# Patient Record
Sex: Female | Born: 1945 | Race: Black or African American | Hispanic: No | Marital: Married | Smoking: Never smoker
Health system: Southern US, Community
[De-identification: ages and names within clinical notes are randomized; demographics above are authoritative.]

## PROBLEM LIST (undated history)

## (undated) DIAGNOSIS — T7840XA Allergy, unspecified, initial encounter: Secondary | ICD-10-CM

## (undated) DIAGNOSIS — Z9889 Other specified postprocedural states: Secondary | ICD-10-CM

## (undated) DIAGNOSIS — I10 Essential (primary) hypertension: Secondary | ICD-10-CM

## (undated) DIAGNOSIS — I2510 Atherosclerotic heart disease of native coronary artery without angina pectoris: Secondary | ICD-10-CM

## (undated) DIAGNOSIS — R112 Nausea with vomiting, unspecified: Secondary | ICD-10-CM

## (undated) DIAGNOSIS — N289 Disorder of kidney and ureter, unspecified: Secondary | ICD-10-CM

## (undated) HISTORY — DX: Allergy, unspecified, initial encounter: T78.40XA

## (undated) HISTORY — PX: ABDOMINAL HYSTERECTOMY: SHX81

## (undated) HISTORY — PX: CORONARY ANGIOPLASTY WITH STENT PLACEMENT: SHX49

---

## 2012-03-26 DIAGNOSIS — D179 Benign lipomatous neoplasm, unspecified: Secondary | ICD-10-CM

## 2012-03-26 HISTORY — DX: Benign lipomatous neoplasm, unspecified: D17.9

## 2012-12-09 HISTORY — PX: MITRAL VALVE REPAIR: SHX2039

## 2018-04-24 HISTORY — PX: TRANSCATHETER AORTIC VALVE REPLACEMENT, TRANSFEMORAL: SHX6400

## 2019-03-27 HISTORY — PX: CAROTID ENDARTERECTOMY: SUR193

## 2020-03-09 ENCOUNTER — Other Ambulatory Visit: Payer: Self-pay

## 2020-03-09 ENCOUNTER — Encounter (HOSPITAL_COMMUNITY): Payer: Self-pay

## 2020-03-09 ENCOUNTER — Observation Stay (HOSPITAL_COMMUNITY)
Admission: EM | Admit: 2020-03-09 | Discharge: 2020-03-11 | Disposition: A | Discharge status: Home / Self Care | Payer: Medicare HMO | Attending: Emergency Medicine | Admitting: Emergency Medicine

## 2020-03-09 DIAGNOSIS — N1832 Chronic kidney disease, stage 3b: Secondary | ICD-10-CM | POA: Diagnosis not present

## 2020-03-09 DIAGNOSIS — K5730 Diverticulosis of large intestine without perforation or abscess without bleeding: Secondary | ICD-10-CM | POA: Insufficient documentation

## 2020-03-09 DIAGNOSIS — E039 Hypothyroidism, unspecified: Secondary | ICD-10-CM | POA: Diagnosis present

## 2020-03-09 DIAGNOSIS — I2510 Atherosclerotic heart disease of native coronary artery without angina pectoris: Secondary | ICD-10-CM | POA: Diagnosis not present

## 2020-03-09 DIAGNOSIS — Z7901 Long term (current) use of anticoagulants: Secondary | ICD-10-CM | POA: Insufficient documentation

## 2020-03-09 DIAGNOSIS — R531 Weakness: Secondary | ICD-10-CM | POA: Diagnosis not present

## 2020-03-09 DIAGNOSIS — I480 Paroxysmal atrial fibrillation: Secondary | ICD-10-CM | POA: Diagnosis present

## 2020-03-09 DIAGNOSIS — I129 Hypertensive chronic kidney disease with stage 1 through stage 4 chronic kidney disease, or unspecified chronic kidney disease: Secondary | ICD-10-CM | POA: Diagnosis not present

## 2020-03-09 DIAGNOSIS — E876 Hypokalemia: Secondary | ICD-10-CM | POA: Diagnosis present

## 2020-03-09 DIAGNOSIS — I700 Atherosclerosis of aorta: Secondary | ICD-10-CM | POA: Insufficient documentation

## 2020-03-09 DIAGNOSIS — K8020 Calculus of gallbladder without cholecystitis without obstruction: Secondary | ICD-10-CM | POA: Diagnosis not present

## 2020-03-09 DIAGNOSIS — K449 Diaphragmatic hernia without obstruction or gangrene: Secondary | ICD-10-CM | POA: Diagnosis not present

## 2020-03-09 DIAGNOSIS — R112 Nausea with vomiting, unspecified: Principal | ICD-10-CM | POA: Diagnosis present

## 2020-03-09 DIAGNOSIS — Z20822 Contact with and (suspected) exposure to covid-19: Secondary | ICD-10-CM | POA: Insufficient documentation

## 2020-03-09 DIAGNOSIS — E782 Mixed hyperlipidemia: Secondary | ICD-10-CM | POA: Diagnosis not present

## 2020-03-09 DIAGNOSIS — E119 Type 2 diabetes mellitus without complications: Secondary | ICD-10-CM | POA: Diagnosis present

## 2020-03-09 DIAGNOSIS — Z79899 Other long term (current) drug therapy: Secondary | ICD-10-CM | POA: Insufficient documentation

## 2020-03-09 DIAGNOSIS — E1169 Type 2 diabetes mellitus with other specified complication: Secondary | ICD-10-CM | POA: Diagnosis not present

## 2020-03-09 DIAGNOSIS — I160 Hypertensive urgency: Secondary | ICD-10-CM | POA: Diagnosis present

## 2020-03-09 DIAGNOSIS — R9431 Abnormal electrocardiogram [ECG] [EKG]: Secondary | ICD-10-CM | POA: Diagnosis present

## 2020-03-09 DIAGNOSIS — E1122 Type 2 diabetes mellitus with diabetic chronic kidney disease: Secondary | ICD-10-CM | POA: Diagnosis present

## 2020-03-09 HISTORY — DX: Essential (primary) hypertension: I10

## 2020-03-09 HISTORY — DX: Atherosclerotic heart disease of native coronary artery without angina pectoris: I25.10

## 2020-03-09 HISTORY — DX: Disorder of kidney and ureter, unspecified: N28.9

## 2020-03-09 LAB — CBC WITH DIFFERENTIAL/PLATELET
Abs Immature Granulocytes: 0.02 10*3/uL (ref 0.00–0.07)
Basophils Absolute: 0.1 10*3/uL (ref 0.0–0.1)
Basophils Relative: 1 %
Eosinophils Absolute: 0.1 10*3/uL (ref 0.0–0.5)
Eosinophils Relative: 1 %
HCT: 31.3 % — ABNORMAL LOW (ref 36.0–46.0)
Hemoglobin: 10 g/dL — ABNORMAL LOW (ref 12.0–15.0)
Immature Granulocytes: 0 %
Lymphocytes Relative: 13 %
Lymphs Abs: 1.1 10*3/uL (ref 0.7–4.0)
MCH: 29.7 pg (ref 26.0–34.0)
MCHC: 31.9 g/dL (ref 30.0–36.0)
MCV: 92.9 fL (ref 80.0–100.0)
Monocytes Absolute: 0.6 10*3/uL (ref 0.1–1.0)
Monocytes Relative: 7 %
Neutro Abs: 6.3 10*3/uL (ref 1.7–7.7)
Neutrophils Relative %: 78 %
Platelets: 339 10*3/uL (ref 150–400)
RBC: 3.37 MIL/uL — ABNORMAL LOW (ref 3.87–5.11)
RDW: 13.7 % (ref 11.5–15.5)
WBC: 8.1 10*3/uL (ref 4.0–10.5)
nRBC: 0 % (ref 0.0–0.2)

## 2020-03-09 LAB — COMPREHENSIVE METABOLIC PANEL
ALT: 16 U/L (ref 0–44)
AST: 21 U/L (ref 15–41)
Albumin: 3.5 g/dL (ref 3.5–5.0)
Alkaline Phosphatase: 57 U/L (ref 38–126)
Anion gap: 10 (ref 5–15)
BUN: 24 mg/dL — ABNORMAL HIGH (ref 8–23)
CO2: 25 mmol/L (ref 22–32)
Calcium: 8.5 mg/dL — ABNORMAL LOW (ref 8.9–10.3)
Chloride: 103 mmol/L (ref 98–111)
Creatinine, Ser: 1.78 mg/dL — ABNORMAL HIGH (ref 0.44–1.00)
GFR, Estimated: 29 mL/min — ABNORMAL LOW (ref >60)
Glucose, Bld: 133 mg/dL — ABNORMAL HIGH (ref 70–99)
Potassium: 2.8 mmol/L — ABNORMAL LOW (ref 3.5–5.1)
Sodium: 138 mmol/L (ref 135–145)
Total Bilirubin: 0.5 mg/dL (ref 0.3–1.2)
Total Protein: 6.8 g/dL (ref 6.5–8.1)

## 2020-03-09 LAB — PROTIME-INR
INR: 1.6 — ABNORMAL HIGH (ref 0.8–1.2)
Prothrombin Time: 18.5 seconds — ABNORMAL HIGH (ref 11.4–15.2)

## 2020-03-09 LAB — MAGNESIUM: Magnesium: 2.1 mg/dL (ref 1.7–2.4)

## 2020-03-09 MED ORDER — SODIUM CHLORIDE 0.9 % IV BOLUS
500.0000 mL | Freq: Once | INTRAVENOUS | Status: AC
  Administered 2020-03-09: 500 mL via INTRAVENOUS

## 2020-03-09 MED ORDER — LORAZEPAM 2 MG/ML IJ SOLN
0.5000 mg | Freq: Once | INTRAMUSCULAR | Status: AC
  Administered 2020-03-09: 0.5 mg via INTRAVENOUS
  Filled 2020-03-09: qty 1

## 2020-03-09 MED ORDER — POTASSIUM CHLORIDE 10 MEQ/100ML IV SOLN
10.0000 meq | INTRAVENOUS | Status: AC
  Administered 2020-03-10 (×2): 10 meq via INTRAVENOUS
  Filled 2020-03-09 (×2): qty 100

## 2020-03-09 NOTE — ED Notes (Signed)
Obtaining vitals delayed due to pt being cleaned.  Pt was covered in feces upon arrival.  Pt states she had a bowel movement while vomiting.  NADN.  Will continue to monitor.

## 2020-03-09 NOTE — ED Provider Notes (Addendum)
[facility] DEPT Provider Note   CSN: [id number] Arrival date & time: 03/09/20  2139     History Chief Complaint  Patient presents with  . Nausea  . Weakness    Destiny Rice is a 74 y.o. female with a history of CAD x 10 stents on coumadin, diabetes mellitus type 2, HTN, and CKD who presents to the emergency department by EMS from home with a chief complaint of nausea, vomiting, and diarrhea.  The patient has had countless episodes of nonbloody, nonbilious vomiting, nonbloody diarrhea, nausea that began approximately 1 to 2 hours prior to arrival.  She reports that after onset of symptoms, she also began to feel very lightheaded as if she might pass out.  Prior to onset of symptoms, she was feeling at her baseline.  She denies fever, chills, abdominal pain, dysuria, hematuria, flank pain, vaginal bleeding or discharge, chest pain, shortness of breath, headache, visual changes, dizziness, numbness, or syncope.  She ate clam chowder and a tuna sandwich for dinner.  Her husband also ate the same, but has been asymptomatic.  No other concerns for suspicious food intake.  No recent travel.  No known sick contacts.  The patient recently moved to the area from [place] was recently established with cardiology and at the Coumadin clinic.  She reports that she has had multiple medication changes in the last week.  She denies a history of congestive heart failure.  The history is provided by the patient and medical records. No language interpreter was used.       Past Medical History:  Diagnosis Date  . Coronary artery disease   . Hypertension   . Renal disorder     Patient Active Problem List   Diagnosis Date Noted  . Intractable nausea and vomiting 03/10/2020    Past Surgical History:  Procedure Laterality Date  . CORONARY ANGIOPLASTY WITH STENT PLACEMENT     x10     OB History   No obstetric history on file.     History reviewed. No pertinent  family history.     Home Medications Prior to Admission medications   Medication Sig Start Date End Date Taking? Authorizing Provider  amiodarone (PACERONE) 200 MG tablet Take 200 mg by mouth daily.   Yes [provider]  folic acid (FOLVITE) 1 MG tablet Take 1 mg by mouth daily.   Yes [provider]  hydrochlorothiazide (HYDRODIURIL) 25 MG tablet Take 25 mg by mouth daily.   Yes [provider]  losartan (COZAAR) 100 MG tablet Take 100 mg by mouth daily.   Yes [provider]  vitamin B-12 (CYANOCOBALAMIN) 1000 MCG tablet Take 1,000 mcg by mouth daily.   Yes [provider]    Allergies    Sulfa antibiotics  Review of Systems   Review of Systems  Constitutional: Negative for activity change, chills, diaphoresis and fever.  HENT: Negative for congestion and sore throat.   Respiratory: Negative for cough, shortness of breath and wheezing.   Cardiovascular: Negative for chest pain and palpitations.  Gastrointestinal: Positive for diarrhea, nausea and vomiting. Negative for abdominal pain, blood in stool and constipation.  Genitourinary: Negative for dysuria, flank pain, frequency, hematuria, urgency, vaginal bleeding, vaginal discharge and vaginal pain.  Musculoskeletal: Negative for back pain, myalgias, neck pain and neck stiffness.  Skin: Negative for color change, rash and wound.  Allergic/Immunologic: Negative for immunocompromised state.  Neurological: Positive for weakness. Negative for seizures, syncope, numbness and headaches.  Psychiatric/Behavioral: Negative  for confusion.    Physical Exam Updated Vital Signs BP (!) 149/123   Pulse 65   Temp 97.7 F (36.5 C)   Resp 18   Ht 5\' 5"  (1.651 m)   Wt 94.8 kg   SpO2 97%   BMI 34.78 kg/m   Physical Exam Vitals and nursing note reviewed.  Constitutional:      General: She is not in acute distress.    Appearance: She is obese. She is not ill-appearing, toxic-appearing or  diaphoretic.  HENT:     Head: Normocephalic.     Mouth/Throat:     Mouth: Mucous membranes are moist.  Eyes:     Conjunctiva/sclera: Conjunctivae normal.  Cardiovascular:     Rate and Rhythm: Normal rate and regular rhythm.     Heart sounds: No murmur heard. No friction rub. No gallop.   Pulmonary:     Effort: Pulmonary effort is normal. No respiratory distress.     Breath sounds: No stridor. No wheezing, rhonchi or rales.  Chest:     Chest wall: No tenderness.  Abdominal:     General: There is no distension.     Palpations: Abdomen is soft. There is no mass.     Tenderness: There is no abdominal tenderness. There is no right CVA tenderness, left CVA tenderness, guarding or rebound.     Hernia: No hernia is present.     Comments: Abdomen is soft, nontender, nondistended.  Musculoskeletal:     Cervical back: Neck supple.  Skin:    General: Skin is warm.     Capillary Refill: Capillary refill takes less than 2 seconds.     Findings: No rash.  Neurological:     Mental Status: She is alert.  Psychiatric:        Behavior: Behavior normal.     ED Results / Procedures / Treatments   Labs (all labs ordered are listed, but only abnormal results are displayed) Labs Reviewed  CBC WITH DIFFERENTIAL/PLATELET - Abnormal; Notable for the following components:      Result Value   RBC 3.37 (*)    Hemoglobin 10.0 (*)    HCT 31.3 (*)    All other components within normal limits  COMPREHENSIVE METABOLIC PANEL - Abnormal; Notable for the following components:   Potassium 2.8 (*)    Glucose, Bld 133 (*)    BUN 24 (*)    Creatinine, Ser 1.78 (*)    Calcium 8.5 (*)    GFR, Estimated 29 (*)    All other components within normal limits  PROTIME-INR - Abnormal; Notable for the following components:   Prothrombin Time 18.5 (*)    INR 1.6 (*)    All other components within normal limits  RESP PANEL BY RT-PCR (FLU A&B, COVID) ARPGX2  URINALYSIS, ROUTINE W REFLEX MICROSCOPIC  MAGNESIUM     EKG EKG Interpretation  Date/Time:  Wednesday March 09 2020 22:21:15 EST Ventricular Rate:  61 PR Interval:    QRS Duration: 108 QT Interval:  504 QTC Calculation: 508 R Axis:   73 Text Interpretation: Sinus rhythm Prolonged PR interval Prolonged QT interval No old tracing to compare Confirmed by Ward, Cyril Mourning [phone]) on 03/09/2020 11:32:39 PM   Radiology CT ABDOMEN PELVIS WO CONTRAST  Result Date: 03/10/2020 CLINICAL DATA:  Nausea, vomiting EXAM: CT ABDOMEN AND PELVIS WITHOUT CONTRAST TECHNIQUE: Multidetector CT imaging of the abdomen and pelvis was performed following the standard protocol without IV contrast. COMPARISON:  None. FINDINGS: Lower chest: Moderate-sized hiatal hernia.  Peribronchial thickening in the lower lobes. Bibasilar opacities, likely atelectasis. No effusions. Hepatobiliary: Small layering gallstones within the gallbladder. No focal hepatic abnormality. Pancreas: No focal abnormality or ductal dilatation. Spleen: No focal abnormality.  Normal size. Adrenals/Urinary Tract: Small bilateral renal cysts. Renovascular calcifications. No ureteral stones or hydronephrosis. Adrenal glands and urinary bladder unremarkable. Stomach/Bowel: Colonic diverticulosis. No active diverticulitis. Normal appendix. Stomach and small bowel decompressed, unremarkable. Vascular/Lymphatic: Aortic atherosclerosis. No evidence of aneurysm or adenopathy. Reproductive: Prior hysterectomy.  No adnexal masses. Other: No free fluid or free air. Musculoskeletal: No acute bony abnormality. IMPRESSION: Peribronchial thickening in the lower lobes.  Bibasilar atelectasis. Moderate-sized hiatal hernia. Cholelithiasis. Colonic diverticulosis.  No active diverticulitis. Aortic atherosclerosis. Electronically Signed   By: Rolm Baptise M.D.   On: 03/10/2020 01:58    Procedures Procedures (including critical care time)  Medications Ordered in ED Medications  LORazepam (ATIVAN) injection 0.5 mg (0.5 mg  Intravenous Given 03/09/20 2250)  sodium chloride 0.9 % bolus 500 mL (0 mLs Intravenous Stopped 03/10/20 0031)  potassium chloride 10 mEq in 100 mL IVPB (0 mEq Intravenous Stopped 03/10/20 0328)  hydrALAZINE (APRESOLINE) injection 10 mg (10 mg Intravenous Given 03/10/20 0045)  potassium chloride SA (KLOR-CON) CR tablet 40 mEq (40 mEq Oral Given 03/10/20 0045)  meclizine (ANTIVERT) tablet 12.5 mg (12.5 mg Oral Given 03/10/20 0153)  LORazepam (ATIVAN) injection 0.5 mg (0.5 mg Intravenous Given 03/10/20 0211)  sodium chloride 0.9 % bolus 1,000 mL (1,000 mLs Intravenous New Bag/Given 03/10/20 0328)    ED Course  I have reviewed the triage vital signs and the nursing notes.  Pertinent labs & imaging results that were available during my care of the patient were reviewed by me and considered in my medical decision making (see chart for details).  Clinical Course as of 03/10/20 0410  Thu Mar 10, 2020  0314 Called back to room.  Patient is actively vomiting at bedside.  We will plan for admission for intractable vomiting. [MM]    Clinical Course User Index [MM] Amada Hallisey, Laymond Purser, PA-C   MDM Rules/Calculators/A&P                          74 year old female with a history of CAD x 10 stents on coumadin, diabetes mellitus type 2, HTN, and CKD who presents emergency department by EMS with nausea, vomiting, diarrhea, onset 1 to 2 hours prior to arrival.  Markedly hypertensive at 220/60 on arrival.  She is afebrile.  No tachycardia, tachypnea, or hypoxia.  The patient was seen and independently evaluated by Dr. Leonides Schanz, attending physician.  Differential diagnosis includes cholecystitis, pancreatitis, viral gastroenteritis, gastroenteritis secondary to food poisoning, appendicitis, bowel obstruction, pyelonephritis.  Labs and imaging independently reviewed and interpreted by me.  She is hypokalemic to 2.8.  Magnesium level is normal.  She does have a prolonged QTC on EKG.  Creatinine is 1.78.  No  previous labs available for comparison.  Hemoglobin is 10, but she denies any recent bleeding.  No previous available for comparison.  She is on Coumadin, but denies any active bleeding.  INR is 1.6.  UA is pending.  CT with cholelithiasis, but no evidence of cholecystitis.  She has colonic diverticulosis.  There is peribronchial thickening in the lower lobes and bibasilar atelectasis.  No acute intra-abdominal findings.  She was treated with 1 L of IV fluids in the ER.  She was given Ativan x2 for vomiting given prolonged QTC, but continued to have intractable  vomiting.  Given that she was already hypokalemic, she will require admission for intractable vomiting.  I am suspicious for contaminated food, but she does note that her husband ate the same meal and has not been asymptomatic.  She adamantly denies chest pain.  In regards to her blood pressure, she has been compliant with all of her home medications, but does note that she recently had her medications changed over the last week.  She does have some slight confusion about her current home regimen.  She was given IV hydralazine with initial improvement, but blood pressure has significantly waxed and waned since she arrived in the emergency department. I have a low suspicion for hypertensive urgency or emergency at this time.  Spoke with Dr. Cyd Silence, hospitalist, who will accept the patient for admission.  The patient appears reasonably stabilized for admission considering the current resources, flow, and capabilities available in the ED at this time, and I doubt any other [facility] requiring further screening and/or treatment in the ED prior to admission.  ADDENDUM: Care everywhere records were not available at the time that this patient was seen and evaluated.  They were available after the patient was admitted.   Final Clinical Impression(s) / ED Diagnoses Final diagnoses:  Intractable vomiting with nausea, unspecified vomiting type  Hypokalemia     Rx / DC Orders ED Discharge Orders    None       Lekia Nier A, PA-C 03/10/20 0405    Abeera Flannery A, PA-C 03/10/20 0410    Ward, Delice Bison, DO 03/10/20 [phone]

## 2020-03-09 NOTE — ED Triage Notes (Signed)
Pt to ER via EMS complaining of N/V/weakness.  Pt also defecated on herself while vomiting.  Pt reports a history of vertigo, but states that this feels different.  Pt actively vomiting upon arrival.

## 2020-03-10 ENCOUNTER — Encounter (HOSPITAL_COMMUNITY): Payer: Self-pay | Admitting: Internal Medicine

## 2020-03-10 ENCOUNTER — Emergency Department (HOSPITAL_COMMUNITY): Payer: Medicare HMO

## 2020-03-10 DIAGNOSIS — E1122 Type 2 diabetes mellitus with diabetic chronic kidney disease: Secondary | ICD-10-CM

## 2020-03-10 DIAGNOSIS — N1832 Chronic kidney disease, stage 3b: Secondary | ICD-10-CM | POA: Diagnosis present

## 2020-03-10 DIAGNOSIS — E119 Type 2 diabetes mellitus without complications: Secondary | ICD-10-CM | POA: Diagnosis present

## 2020-03-10 DIAGNOSIS — I160 Hypertensive urgency: Secondary | ICD-10-CM | POA: Diagnosis not present

## 2020-03-10 DIAGNOSIS — R112 Nausea with vomiting, unspecified: Secondary | ICD-10-CM

## 2020-03-10 DIAGNOSIS — I480 Paroxysmal atrial fibrillation: Secondary | ICD-10-CM | POA: Diagnosis not present

## 2020-03-10 DIAGNOSIS — E039 Hypothyroidism, unspecified: Secondary | ICD-10-CM | POA: Diagnosis present

## 2020-03-10 DIAGNOSIS — R9431 Abnormal electrocardiogram [ECG] [EKG]: Secondary | ICD-10-CM

## 2020-03-10 DIAGNOSIS — E782 Mixed hyperlipidemia: Secondary | ICD-10-CM

## 2020-03-10 DIAGNOSIS — E876 Hypokalemia: Secondary | ICD-10-CM

## 2020-03-10 DIAGNOSIS — E1169 Type 2 diabetes mellitus with other specified complication: Secondary | ICD-10-CM

## 2020-03-10 DIAGNOSIS — I2510 Atherosclerotic heart disease of native coronary artery without angina pectoris: Secondary | ICD-10-CM | POA: Diagnosis present

## 2020-03-10 HISTORY — DX: Hypothyroidism, unspecified: E03.9

## 2020-03-10 HISTORY — DX: Type 2 diabetes mellitus with diabetic chronic kidney disease: E11.22

## 2020-03-10 HISTORY — DX: Type 2 diabetes mellitus with other specified complication: E11.69

## 2020-03-10 HISTORY — DX: Paroxysmal atrial fibrillation: I48.0

## 2020-03-10 HISTORY — DX: Chronic kidney disease, stage 3b: N18.32

## 2020-03-10 LAB — URINALYSIS, ROUTINE W REFLEX MICROSCOPIC
Bilirubin Urine: NEGATIVE
Glucose, UA: NEGATIVE mg/dL
Hgb urine dipstick: NEGATIVE
Ketones, ur: NEGATIVE mg/dL
Leukocytes,Ua: NEGATIVE
Nitrite: NEGATIVE
Protein, ur: NEGATIVE mg/dL
Specific Gravity, Urine: 1.012 (ref 1.005–1.030)
pH: 6 (ref 5.0–8.0)

## 2020-03-10 LAB — GLUCOSE, CAPILLARY
Glucose-Capillary: 201 mg/dL — ABNORMAL HIGH (ref 70–99)
Glucose-Capillary: 114 mg/dL — ABNORMAL HIGH (ref 70–99)

## 2020-03-10 LAB — APTT: aPTT: 34 seconds (ref 24–36)

## 2020-03-10 LAB — RESP PANEL BY RT-PCR (FLU A&B, COVID) ARPGX2
Influenza A by PCR: NEGATIVE
Influenza B by PCR: NEGATIVE
SARS Coronavirus 2 by RT PCR: NEGATIVE

## 2020-03-10 LAB — TROPONIN I (HIGH SENSITIVITY): Troponin I (High Sensitivity): 12 ng/L (ref <18)

## 2020-03-10 MED ORDER — SODIUM CHLORIDE 0.9 % IV BOLUS
1000.0000 mL | Freq: Once | INTRAVENOUS | Status: AC
  Administered 2020-03-10: 1000 mL via INTRAVENOUS

## 2020-03-10 MED ORDER — HYDROCHLOROTHIAZIDE 25 MG PO TABS
25.0000 mg | ORAL_TABLET | Freq: Every day | ORAL | Status: DC
  Administered 2020-03-10: 25 mg via ORAL
  Filled 2020-03-10: qty 1

## 2020-03-10 MED ORDER — LABETALOL HCL 5 MG/ML IV SOLN
20.0000 mg | INTRAVENOUS | Status: DC | PRN
  Administered 2020-03-10: 20 mg via INTRAVENOUS
  Filled 2020-03-10: qty 4

## 2020-03-10 MED ORDER — ACETAMINOPHEN 650 MG RE SUPP: 650.0000 mg | Freq: Four times a day (QID) | RECTAL | Status: DC | PRN

## 2020-03-10 MED ORDER — WARFARIN - PHARMACIST DOSING INPATIENT: Freq: Every day | Status: DC

## 2020-03-10 MED ORDER — FOLIC ACID 1 MG PO TABS
1.0000 mg | ORAL_TABLET | Freq: Every day | ORAL | Status: DC
  Administered 2020-03-10: 1 mg via ORAL
  Filled 2020-03-10: qty 1

## 2020-03-10 MED ORDER — LEVOTHYROXINE SODIUM 25 MCG PO TABS
25.0000 ug | ORAL_TABLET | Freq: Every day | ORAL | Status: DC
  Administered 2020-03-11: 25 ug via ORAL
  Filled 2020-03-10: qty 1

## 2020-03-10 MED ORDER — HYDRALAZINE HCL 20 MG/ML IJ SOLN
10.0000 mg | Freq: Once | INTRAMUSCULAR | Status: AC
  Administered 2020-03-10: 10 mg via INTRAVENOUS
  Filled 2020-03-10: qty 1

## 2020-03-10 MED ORDER — POTASSIUM CHLORIDE CRYS ER 20 MEQ PO TBCR
40.0000 meq | EXTENDED_RELEASE_TABLET | Freq: Once | ORAL | Status: AC
  Administered 2020-03-10: 40 meq via ORAL
  Filled 2020-03-10: qty 2

## 2020-03-10 MED ORDER — AMIODARONE HCL 200 MG PO TABS: 200.0000 mg | ORAL_TABLET | Freq: Every day | ORAL | Status: DC

## 2020-03-10 MED ORDER — INSULIN ASPART 100 UNIT/ML ~~LOC~~ SOLN
0.0000 [IU] | Freq: Three times a day (TID) | SUBCUTANEOUS | Status: DC
  Administered 2020-03-10: 5 [IU] via SUBCUTANEOUS

## 2020-03-10 MED ORDER — LOSARTAN POTASSIUM 50 MG PO TABS
100.0000 mg | ORAL_TABLET | Freq: Every day | ORAL | Status: DC
  Administered 2020-03-10: 100 mg via ORAL
  Filled 2020-03-10: qty 2

## 2020-03-10 MED ORDER — LORAZEPAM 2 MG/ML IJ SOLN
0.5000 mg | Freq: Once | INTRAMUSCULAR | Status: AC
  Administered 2020-03-10: 0.5 mg via INTRAVENOUS
  Filled 2020-03-10: qty 1

## 2020-03-10 MED ORDER — ISOSORBIDE MONONITRATE 10 MG PO TABS
20.0000 mg | ORAL_TABLET | Freq: Every day | ORAL | Status: DC
  Administered 2020-03-10: 20 mg via ORAL
  Filled 2020-03-10 (×2): qty 2

## 2020-03-10 MED ORDER — CARVEDILOL 25 MG PO TABS
25.0000 mg | ORAL_TABLET | Freq: Two times a day (BID) | ORAL | Status: DC
  Administered 2020-03-10 – 2020-03-11 (×2): 25 mg via ORAL
  Filled 2020-03-10 (×2): qty 1

## 2020-03-10 MED ORDER — ALPRAZOLAM 0.25 MG PO TABS: 0.2500 mg | ORAL_TABLET | Freq: Every evening | ORAL | Status: DC | PRN

## 2020-03-10 MED ORDER — MECLIZINE HCL 25 MG PO TABS
12.5000 mg | ORAL_TABLET | Freq: Once | ORAL | Status: AC
  Administered 2020-03-10: 12.5 mg via ORAL
  Filled 2020-03-10: qty 1

## 2020-03-10 MED ORDER — POTASSIUM CHLORIDE 10 MEQ/100ML IV SOLN
10.0000 meq | INTRAVENOUS | Status: AC
  Administered 2020-03-10 (×6): 10 meq via INTRAVENOUS
  Filled 2020-03-10 (×6): qty 100

## 2020-03-10 MED ORDER — PROCHLORPERAZINE EDISYLATE 10 MG/2ML IJ SOLN: 10.0000 mg | Freq: Four times a day (QID) | INTRAMUSCULAR | Status: DC | PRN

## 2020-03-10 MED ORDER — WARFARIN SODIUM 4 MG PO TABS
4.0000 mg | ORAL_TABLET | Freq: Once | ORAL | Status: AC
  Administered 2020-03-10: 4 mg via ORAL
  Filled 2020-03-10 (×2): qty 1

## 2020-03-10 MED ORDER — ATORVASTATIN CALCIUM 40 MG PO TABS
40.0000 mg | ORAL_TABLET | Freq: Every day | ORAL | Status: DC
  Filled 2020-03-10: qty 1

## 2020-03-10 MED ORDER — VITAMIN B-12 1000 MCG PO TABS
1000.0000 ug | ORAL_TABLET | Freq: Every day | ORAL | Status: DC
  Administered 2020-03-10: 1000 ug via ORAL
  Filled 2020-03-10: qty 1

## 2020-03-10 MED ORDER — AMLODIPINE BESYLATE 10 MG PO TABS
10.0000 mg | ORAL_TABLET | Freq: Every day | ORAL | Status: DC
  Administered 2020-03-10: 10 mg via ORAL
  Filled 2020-03-10: qty 1

## 2020-03-10 MED ORDER — POLYETHYLENE GLYCOL 3350 17 G PO PACK: 17.0000 g | PACK | Freq: Every day | ORAL | Status: DC | PRN

## 2020-03-10 MED ORDER — ACETAMINOPHEN 325 MG PO TABS: 650.0000 mg | ORAL_TABLET | Freq: Four times a day (QID) | ORAL | Status: DC | PRN

## 2020-03-10 NOTE — Progress Notes (Signed)
ANTICOAGULATION CONSULT NOTE -  Consult  Pharmacy Consult for warfarin Indication: atrial fibrillation  Allergies  Allergen Reactions  . Sulfa Antibiotics Hives    Patient Measurements: Height: 5\' 5"  (165.1 cm) Weight: 94.8 kg (209 lb) IBW/kg (Calculated) : 57 Heparin Dosing Weight:   Vital Signs: BP: 173/71 (12/16 0840) Pulse Rate: 60 (12/16 0840)  Labs: Recent Labs    03/09/20 2253 03/09/20 2336 03/10/20 0827  HGB 10.0*  --   --   HCT 31.3*  --   --   PLT 339  --   --   LABPROT  --  18.5*  --   INR  --  1.6*  --   CREATININE 1.78*  --   --   TROPONINIHS  --   --  12    Estimated Creatinine Clearance: 31.1 mL/min (A) (by C-G formula based on SCr of 1.78 mg/dL (H)).   Medications:  Scheduled:    Assessment: Pharmacy is consulted to dose warfarin in 74 yo female with PMH of afib. Pt home regimen has been updated and listed as warfarin 4 mg PO daily.   Today, 03/10/20   INR 1.6   Hgb 10, plt 339      Goal of Therapy:  INR 2-3 Monitor platelets by anticoagulation protocol: Yes   Plan:   Warfarin 4 mg PO x 1  Daily INR   Monitor for sign and symptoms of bleeding  Royetta Asal, PharmD, BCPS 03/10/2020 10:43 AM

## 2020-03-10 NOTE — H&P (Signed)
History and Physical    Destiny Rice KMM:[id number] DOB: 1945/08/27 DOA: 03/09/2020  PCP: Patient, No Pcp Per  Patient coming from: Home   Chief Complaint:  Chief Complaint  Patient presents with  . Nausea  . Weakness     HPI:    74 year old female with medical history of coronary artery disease (Hx numerous stents in [place]), hypertension, hyperlipidemia, paroxysmal atrial fibrillation (on Coumadin), chronic kidney disease stage IIIb presenting to [facility] emergency department with complaints of nausea and vomiting.  Patient has just recently moved here from [facility] in October.  Patient previously received all of her medical care there and is just now being established with local providers in the area, most notes accessible via care everywhere.  Patient explains that yesterday morning she was running errands when she began to feel lightheaded at times, feeling like "I was going to pass out."  This required the patient sit down on multiple occasions.  Later that afternoon,  shortly after eating some recently purchased tuna patient began to experience intense nausea.  This is associated with frequent bouts of nonbilious nonbloody vomiting.  Patient denies any associated abdominal pain.  Patient denies any dysuria, low back pain, chest pain, fevers, sick contacts or confirmed contact with COVID-19 infection.  Patient symptoms continue to persist for several hours.  Patient has been unable to tolerate any oral or solid food intake.  Patient eventually decided to present to [facility] emergency department for evaluation.  Patient does explain that she has had multiple medication changes in the past several days to weeks since she had her care reestablished with several local healthcare providers.  In particular, patient has recently been initiated on losartan 2 weeks ago.  Patient has recently had her Metformin discontinued.  Patient has also recently been  prescribed carvedilol according to her most recent PCP note 12/14 however it seems the patient has yet to pick this up or take this.  Upon evaluation in the emergency department patient is continued to exhibit intractable nausea and vomiting.  Multiple attempts have been made to provide the patient with oral therapies and oral challenges and patient continues to vomit.  CT imaging of the abdomen reveals no evidence of acute disease.  Patient is also been found to be suffering from hypertensive urgency with blood pressures as high as 220/60.  The hospitalist group has been called to assess the patient for admission to the hospital.  Review of Systems:   Review of Systems  Gastrointestinal: Positive for nausea and vomiting.  Neurological: Positive for dizziness.  All other systems reviewed and are negative.   Past Medical History:  Diagnosis Date  . AF (paroxysmal atrial fibrillation) ([place]) 03/10/2020  . Chronic kidney disease, stage 3b ([place]) 03/10/2020  . Coronary artery disease   . Hypertension   . Hypothyroidism 03/10/2020  . Mixed hyperlipidemia due to type 2 diabetes mellitus ([place]) 03/10/2020  . Renal disorder   . Type 2 diabetes mellitus with stage 3b chronic kidney disease, without long-term current use of insulin ([place]) 03/10/2020    Past Surgical History:  Procedure Laterality Date  . CORONARY ANGIOPLASTY WITH STENT PLACEMENT     x10     reports that she has never smoked. She has never used smokeless tobacco. No history on file for alcohol use and drug use.  Allergies  Allergen Reactions  . Sulfa Antibiotics Hives    History reviewed. No pertinent family history.   Prior to Admission medications  Medication Sig Start Date End Date Taking? Authorizing Provider  amiodarone (PACERONE) 200 MG tablet Take 200 mg by mouth daily.   Yes [provider]  folic acid (FOLVITE) 1 MG tablet Take 1 mg by mouth daily.   Yes [provider]  hydrochlorothiazide  (HYDRODIURIL) 25 MG tablet Take 25 mg by mouth daily.   Yes [provider]  losartan (COZAAR) 100 MG tablet Take 100 mg by mouth daily.   Yes [provider]  vitamin B-12 (CYANOCOBALAMIN) 1000 MCG tablet Take 1,000 mcg by mouth daily.   Yes [provider]    Physical Exam: Vitals:   03/10/20 0545 03/10/20 0628 03/10/20 0630 03/10/20 0639  BP: (!) 163/64 (!) 213/76 (!) 212/76 (!) 191/69  Pulse: 66 70 72 67  Resp: 11 15 17 18   Temp:      SpO2: 99% 98% 99% 98%  Weight:      Height:        Constitutional: Acute alert and oriented x3, no associated distress.   Skin: no rashes, no lesions, good skin turgor noted. Eyes: Pupils are equally reactive to light.  No evidence of scleral icterus or conjunctival pallor.  ENMT: Moist mucous membranes noted.  Posterior pharynx clear of any exudate or lesions.   Neck: normal, supple, no masses, no thyromegaly.  No evidence of jugular venous distension.   Respiratory: clear to auscultation bilaterally, no wheezing, no crackles. Normal respiratory effort. No accessory muscle use.  Cardiovascular: Regular rate and rhythm, no murmurs / rubs / gallops. No extremity edema. 2+ pedal pulses. No carotid bruits.  Chest:   Nontender without crepitus or deformity.   Back:   Nontender without crepitus or deformity. Abdomen: Abdomen is protuberant but soft and nontender.  No evidence of intra-abdominal masses.  Positive bowel sounds noted in all quadrants.   Musculoskeletal: No joint deformity upper and lower extremities. Good ROM, no contractures. Normal muscle tone.  Neurologic: CN 2-12 grossly intact. Sensation intact.  Patient moving all 4 extremities spontaneously.  Patient is following all commands.  Patient is responsive to verbal stimuli.  Psychiatric: Patient exhibits normal mood with appropriate affect.  Patient seems to possess insight as to their current situation.     Labs on Admission: I have personally reviewed following  labs and imaging studies -   CBC: Recent Labs  Lab 03/09/20 2253  WBC 8.1  NEUTROABS 6.3  HGB 10.0*  HCT 31.3*  MCV 92.9  PLT 956   Basic Metabolic Panel: Recent Labs  Lab 03/09/20 2253  NA 138  K 2.8*  CL 103  CO2 25  GLUCOSE 133*  BUN 24*  CREATININE 1.78*  CALCIUM 8.5*  MG 2.1   GFR: Estimated Creatinine Clearance: 31.1 mL/min (A) (by C-G formula based on SCr of 1.78 mg/dL (H)). Liver Function Tests: Recent Labs  Lab 03/09/20 2253  AST 21  ALT 16  ALKPHOS 57  BILITOT 0.5  PROT 6.8  ALBUMIN 3.5   No results for input(s): LIPASE, AMYLASE in the last 168 hours. No results for input(s): AMMONIA in the last 168 hours. Coagulation Profile: Recent Labs  Lab 03/09/20 2336  INR 1.6*   Cardiac Enzymes: No results for input(s): CKTOTAL, CKMB, CKMBINDEX, TROPONINI in the last 168 hours. BNP (last 3 results) No results for input(s): PROBNP in the last 8760 hours. HbA1C: No results for input(s): HGBA1C in the last 72 hours. CBG: No results for input(s): GLUCAP in the last 168 hours. Lipid Profile: No results for  input(s): CHOL, HDL, LDLCALC, TRIG, CHOLHDL, LDLDIRECT in the last 72 hours. Thyroid Function Tests: No results for input(s): TSH, T4TOTAL, FREET4, T3FREE, THYROIDAB in the last 72 hours. Anemia Panel: No results for input(s): VITAMINB12, FOLATE, FERRITIN, TIBC, IRON, RETICCTPCT in the last 72 hours. Urine analysis:    Component Value Date/Time   COLORURINE YELLOW 03/10/2020 0330   APPEARANCEUR CLEAR 03/10/2020 0330   LABSPEC 1.012 03/10/2020 0330   PHURINE 6.0 03/10/2020 0330   GLUCOSEU NEGATIVE 03/10/2020 0330   HGBUR NEGATIVE 03/10/2020 0330   BILIRUBINUR NEGATIVE 03/10/2020 0330   KETONESUR NEGATIVE 03/10/2020 0330   PROTEINUR NEGATIVE 03/10/2020 0330   NITRITE NEGATIVE 03/10/2020 0330   LEUKOCYTESUR NEGATIVE 03/10/2020 0330    Radiological Exams on Admission - Personally Reviewed: CT ABDOMEN PELVIS WO CONTRAST  Result Date:  03/10/2020 CLINICAL DATA:  Nausea, vomiting EXAM: CT ABDOMEN AND PELVIS WITHOUT CONTRAST TECHNIQUE: Multidetector CT imaging of the abdomen and pelvis was performed following the standard protocol without IV contrast. COMPARISON:  None. FINDINGS: Lower chest: Moderate-sized hiatal hernia. Peribronchial thickening in the lower lobes. Bibasilar opacities, likely atelectasis. No effusions. Hepatobiliary: Small layering gallstones within the gallbladder. No focal hepatic abnormality. Pancreas: No focal abnormality or ductal dilatation. Spleen: No focal abnormality.  Normal size. Adrenals/Urinary Tract: Small bilateral renal cysts. Renovascular calcifications. No ureteral stones or hydronephrosis. Adrenal glands and urinary bladder unremarkable. Stomach/Bowel: Colonic diverticulosis. No active diverticulitis. Normal appendix. Stomach and small bowel decompressed, unremarkable. Vascular/Lymphatic: Aortic atherosclerosis. No evidence of aneurysm or adenopathy. Reproductive: Prior hysterectomy.  No adnexal masses. Other: No free fluid or free air. Musculoskeletal: No acute bony abnormality. IMPRESSION: Peribronchial thickening in the lower lobes.  Bibasilar atelectasis. Moderate-sized hiatal hernia. Cholelithiasis. Colonic diverticulosis.  No active diverticulitis. Aortic atherosclerosis. Electronically Signed   By: Rolm Baptise M.D.   On: 03/10/2020 01:58    EKG: Personally reviewed.  Rhythm is normal sinus rhythm with heart rate of 68 bpm.  Evidence of prolonged QTc of 49ms no dynamic ST segment changes appreciated.  Assessment/Plan Principal Problem:   Intractable nausea and vomiting   Patient presenting with several hour history of intractable nausea vomiting and inability to tolerate oral intake  On exam, abdomen is benign, CT imaging of the abdomen pelvis reveals no evidence of acute disease  Urinalysis reveals no evidence of urinary tract infection  Obtaining troponin to ensure that patient is not  exhibiting an atypical presentation of ACS considering her known history of extensive coronary artery disease and diabetes mellitus  Patient has been given several doses of Ativan in the emergency department by the emergency department staff due to concerns of prolonged QTC.  Repeat EKG reveals prolonged QTC of 495, will avoid Zofran, monitor patient telemetry and continue to dose patient with as needed Compazine.  Patient denies any history of gastroparesis and has never had a gastric emptying study  Hydrating patient gently with intravenous fluids, avoiding excessive hydration due to severe hypertension.  Attempting to correct associated hyperkalemia  Monitoring renal function electrolytes with serial chemistries  Active Problems:   Hypertensive urgency   Patient presenting with blood pressures in the emergency department as high as 220/60  This is not simply secondary to nausea and vomiting - review of outpatient notes in the past 1 month reveals patient is consistently presenting with markedly elevated blood pressures  Will attempt to resume home regimen of amlodipine, losartan and hydrochlorothiazide as patient is able to tolerate oral intake  intravenous labetalol for markedly elevated blood pressures -intravenous hydralazine may  need to be added in conjunction with labetalol to achieve blood pressure control.  Alternatively a nitroglycerin infusion may be helpful.    Will place patient in progressive bed    AF (paroxysmal atrial fibrillation) ([place])  Patient has known history of paroxysmal atrial fibrillation  Patient is typically on Coumadin therapy for anticoagulation per review of outpatient cardiology note via [place] in the last month  It is unclear exactly what Coumadin dosing patient is currently taking  Will place pharmacy consultation for dosing of Coumadin  Monitoring patient on telemetry  Patient is currently in sinus rhythm    Chronic kidney disease,  stage 3b (HCC)   Creatinine is actually near baseline  Strict input and output monitoring  Attempting to avoid nephrotoxic agents  Attempting to achieve blood pressure control as this jeopardizes patient is remaining renal function    Coronary artery disease   Patient reports an extensive history of coronary artery disease status post multiple cardiac catheterizations and approximately 10 stents with last cardiac catheterization in 2020  Patient has recently established care with Dr. Quillian Quince with cardiology at [place] -he is additionally attempting to obtain records to corroborate patient's reported extensive coronary artery disease  Monitoring patient on telemetry for now  Patient is currently chest pain-free    Type 2 diabetes mellitus with stage 3b chronic kidney disease, without long-term current use of insulin ([place])   Accu-Cheks before every meal and nightly with sliding scale insulin  Hemoglobin A1c pending  Metformin has recently been discontinued per review of outpatient notes    Hypothyroidism   Patient is currently taking levothyroxine 25 mcg p.o. daily per review of outpatient notes  Last TSH in care everywhere reveals a TSH at target with this regimen.    Prolonged QT interval   EKGs in the emergency department reveal a prolonged QT, initially 500 ms and now 495 ms.  Likely exacerbated by electrolyte abnormalities  Attempting to provide electrolyte replacement  Monitoring patient on telemetry  Cautious administration of antiemetics -avoiding administration of Zofran and cautiously providing Compazine.  Patient has not responded to as needed Ativan in the emergency department for her nausea and vomiting.  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  Dispo: The patient is from: Home              Anticipated d/c is to: Home              Anticipated d/c date is: 2 days              Patient currently is not medically stable to d/c.        Vernelle Emerald MD [facility] Pager [phone]  If 7PM-7AM, please contact night-coverage [email or web address] Use universal [place] password for that web site. If you do not have the password, please call the hospital operator.  03/10/2020, 6:52 AM

## 2020-03-10 NOTE — Progress Notes (Addendum)
  PROGRESS NOTE  Patient admitted earlier this morning. See H&P.   Destiny Rice is a 74 year old female who was admitted due to nausea and vomiting.  At the time of admission, she had also had hypertensive urgency with blood pressure as high as 220/60.  On examination in the emergency department, her nausea has improved.  She was eating breakfast.  Her blood pressure remains elevated.  Resume home antihypertensives and monitor.    Status is: Observation  The patient will require care spanning > 2 midnights and should be moved to inpatient because: Hemodynamically unstable  Dispo: The patient is from: Home              Anticipated d/c is to: Home              Anticipated d/c date is: 1 day              Patient currently is not medically stable to d/c.  Blood pressure remains elevated.  Resume home oral antihypertensives and monitor. Likely DC in AM.     Dessa Phi, DO [facility] 03/10/2020, 12:03 PM  Available via Epic secure chat 7am-7pm After these hours, please refer to coverage provider listed on [email or web address]     Addendum: Discussed with patient regarding multiple complaints this afternoon, ranging from IV beeping, delay with meal tray, medication questions. We addressed that amiodarone is on hold currently due to QT prolongation on EKG and may be resumed based on repeat EKG in the morning, synthroid is given on an empty stomach in the morning, not at night, for absorption, insulin is given in the short-term period in the hospital for glucose control. She wished to leave the hospital and I discussed with her my recommendation to stay for treatment and close monitoring. She may sign out against medical advice if she is dissatisfied with care provided. Discussed above with patient's RN.    Dessa Phi, DO [facility] 03/10/2020, 5:39 PM   Available via Epic secure chat 7am-7pm After these hours, please refer to coverage provider listed on [email or web address]

## 2020-03-11 DIAGNOSIS — E876 Hypokalemia: Secondary | ICD-10-CM | POA: Diagnosis not present

## 2020-03-11 DIAGNOSIS — R112 Nausea with vomiting, unspecified: Secondary | ICD-10-CM | POA: Diagnosis not present

## 2020-03-11 DIAGNOSIS — Z20822 Contact with and (suspected) exposure to covid-19: Secondary | ICD-10-CM | POA: Diagnosis not present

## 2020-03-11 DIAGNOSIS — R531 Weakness: Secondary | ICD-10-CM | POA: Diagnosis not present

## 2020-03-11 LAB — BASIC METABOLIC PANEL
Anion gap: 12 (ref 5–15)
BUN: 22 mg/dL (ref 8–23)
CO2: 21 mmol/L — ABNORMAL LOW (ref 22–32)
Calcium: 8.3 mg/dL — ABNORMAL LOW (ref 8.9–10.3)
Chloride: 105 mmol/L (ref 98–111)
Creatinine, Ser: 1.67 mg/dL — ABNORMAL HIGH (ref 0.44–1.00)
GFR, Estimated: 32 mL/min — ABNORMAL LOW (ref >60)
Glucose, Bld: 116 mg/dL — ABNORMAL HIGH (ref 70–99)
Potassium: 3.4 mmol/L — ABNORMAL LOW (ref 3.5–5.1)
Sodium: 138 mmol/L (ref 135–145)

## 2020-03-11 LAB — GLUCOSE, CAPILLARY
Glucose-Capillary: 108 mg/dL — ABNORMAL HIGH (ref 70–99)
Glucose-Capillary: 162 mg/dL — ABNORMAL HIGH (ref 70–99)

## 2020-03-11 LAB — PROTIME-INR
INR: 1.9 — ABNORMAL HIGH (ref 0.8–1.2)
Prothrombin Time: 21.2 seconds — ABNORMAL HIGH (ref 11.4–15.2)

## 2020-03-11 LAB — MAGNESIUM: Magnesium: 1.9 mg/dL (ref 1.7–2.4)

## 2020-03-11 MED ORDER — WARFARIN SODIUM 4 MG PO TABS
4.0000 mg | ORAL_TABLET | Freq: Once | ORAL | Status: DC
  Filled 2020-03-11: qty 1

## 2020-03-11 MED ORDER — POTASSIUM CHLORIDE CRYS ER 20 MEQ PO TBCR
40.0000 meq | EXTENDED_RELEASE_TABLET | Freq: Once | ORAL | Status: AC
  Administered 2020-03-11: 40 meq via ORAL
  Filled 2020-03-11: qty 2

## 2020-03-11 MED ORDER — AMLODIPINE BESYLATE 10 MG PO TABS: 10.0000 mg | ORAL_TABLET | Freq: Every day | ORAL | 2 refills | Status: DC

## 2020-03-11 NOTE — Discharge Summary (Signed)
Physician Discharge Summary  Destiny Rice BPZ:[id number] DOB: 1945/08/11 DOA: 03/09/2020  PCP: Sherrine Maples, MD  Admit date: 03/09/2020 Discharge date: 03/11/2020  Admitted From: Home Disposition:  Home  Recommendations for Outpatient Follow-up:  1. Follow up with PCP  2. Has close follow up arranged with cardiology, vascular surgery, internal medicine through [facility] scheduled   Discharge Condition: Stable CODE STATUS: Full  Diet recommendation:  Diet Orders (From admission, onward)    Start     Ordered   03/11/20 0000  Diet - low sodium heart healthy        03/11/20 0850   03/10/20 1206  Diet heart healthy/carb modified Room service appropriate? Yes; Fluid consistency: Thin  Diet effective now       Question Answer Comment  Diet-HS Snack? Nothing   Room service appropriate? Yes   Fluid consistency: Thin      03/10/20 1205         Brief/Interim Summary: From H&P by Dr. Cyd Silence: "74 year old female with medical history of coronary artery disease (Hx numerous stents in [place]), hypertension, hyperlipidemia, paroxysmal atrial fibrillation (on Coumadin), chronic kidney disease stage IIIb presenting to [facility] emergency department with complaints of nausea and vomiting.  Patient has just recently moved here from [facility] in October.  Patient previously received all of her medical care there and is just now being established with local providers in the area, most notes accessible via care everywhere.  Patient explains that yesterday morning she was running errands when she began to feel lightheaded at times, feeling like "I was going to pass out."  This required the patient sit down on multiple occasions.  Later that afternoon,  shortly after eating some recently purchased tuna patient began to experience intense nausea.  This is associated with frequent bouts of nonbilious nonbloody vomiting.  Patient denies any associated abdominal pain.   Patient denies any dysuria, low back pain, chest pain, fevers, sick contacts or confirmed contact with COVID-19 infection.  Patient symptoms continue to persist for several hours.  Patient has been unable to tolerate any oral or solid food intake.  Patient eventually decided to present to [facility] emergency department for evaluation.  Patient does explain that she has had multiple medication changes in the past several days to weeks since she had her care reestablished with several local healthcare providers.  In particular, patient has recently been initiated on losartan 2 weeks ago.  Patient has recently had her Metformin discontinued.  Patient has also recently been prescribed carvedilol according to her most recent PCP note 12/14 however it seems the patient has yet to pick this up or take this.  Upon evaluation in the emergency department patient is continued to exhibit intractable nausea and vomiting.  Multiple attempts have been made to provide the patient with oral therapies and oral challenges and patient continues to vomit.  CT imaging of the abdomen reveals no evidence of acute disease.  Patient is also been found to be suffering from hypertensive urgency with blood pressures as high as 220/60.  The hospitalist group has been called to assess the patient for admission to the hospital."  Patient was monitored closely for her hypertensive urgency. Nausea and vomiting resolved and her home meds were resumed. BP much improved 153/64 on day of discharge.   Discharge Diagnoses:  Principal Problem:   Intractable nausea and vomiting Active Problems:   Hypertensive urgency   AF (paroxysmal atrial fibrillation) (HCC)   Chronic kidney disease,  stage 3b ([employer])   Coronary artery disease   Mixed hyperlipidemia due to type 2 diabetes mellitus (HCC)   Type 2 diabetes mellitus with stage 3b chronic kidney disease, without long-term current use of insulin (HCC)   Hypothyroidism   Prolonged  QT interval   Hypokalemia due to excessive gastrointestinal loss of potassium    Discharge Instructions  Discharge Instructions    Call MD for:  difficulty breathing, headache or visual disturbances   Complete by: As directed    Call MD for:  extreme fatigue   Complete by: As directed    Call MD for:  persistant dizziness or light-headedness   Complete by: As directed    Call MD for:  persistant nausea and vomiting   Complete by: As directed    Call MD for:  severe uncontrolled pain   Complete by: As directed    Call MD for:  temperature >100.4   Complete by: As directed    Diet - low sodium heart healthy   Complete by: As directed    Discharge instructions   Complete by: As directed    You were cared for by a hospitalist during your hospital stay. If you have any questions about your discharge medications or the care you received while you were in the hospital after you are discharged, you can call the unit and ask to speak with the hospitalist on call if the hospitalist that took care of you is not available. Once you are discharged, your primary care physician will handle any further medical issues. Please note that NO REFILLS for any discharge medications will be authorized once you are discharged, as it is imperative that you return to your primary care physician (or establish a relationship with a primary care physician if you do not have one) for your aftercare needs so that they can reassess your need for medications and monitor your lab values.   Increase activity slowly   Complete by: As directed      Allergies as of 03/11/2020      Reactions   Sulfa Antibiotics Hives      Medication List    TAKE these medications   ALPRAZolam 0.25 MG tablet Commonly known as: XANAX Take 0.25 mg by mouth at bedtime as needed for anxiety.   amiodarone 200 MG tablet Commonly known as: PACERONE Take 200 mg by mouth daily.   amLODipine 10 MG tablet Commonly known as: NORVASC Take  1 tablet (10 mg total) by mouth daily.   atorvastatin 40 MG tablet Commonly known as: LIPITOR Take 40 mg by mouth daily.   carvedilol 25 MG tablet Commonly known as: COREG Take 25 mg by mouth 2 (two) times daily with a meal.   cholecalciferol 25 MCG (1000 UNIT) tablet Commonly known as: VITAMIN D3 Take 1,000 Units by mouth daily.   fluticasone 50 MCG/ACT nasal spray Commonly known as: FLONASE Place 1 spray into both nostrils daily.   folic acid 1 MG tablet Commonly known as: FOLVITE Take 1 mg by mouth daily.   hydrochlorothiazide 25 MG tablet Commonly known as: HYDRODIURIL Take 25 mg by mouth daily.   isosorbide mononitrate 20 MG tablet Commonly known as: ISMO Take 20 mg by mouth daily.   levothyroxine 25 MCG tablet Commonly known as: SYNTHROID Take 25 mcg by mouth daily before breakfast.   losartan 100 MG tablet Commonly known as: COZAAR Take 100 mg by mouth daily.   nitroGLYCERIN 0.4 MG SL tablet Commonly known as: NITROSTAT Place  0.4 mg under the tongue every 5 (five) minutes as needed for chest pain.   pantoprazole 20 MG tablet Commonly known as: PROTONIX Take 20 mg by mouth daily.   vitamin B-12 1000 MCG tablet Commonly known as: CYANOCOBALAMIN Take 1,000 mcg by mouth daily.   warfarin 4 MG tablet Commonly known as: COUMADIN Take 4 mg by mouth daily.       Follow-up Information    Sherrine Maples, MD Follow up.   Specialty: Internal Medicine Contact information: [address] [place] [address] [phone]              Allergies  Allergen Reactions  . Sulfa Antibiotics Hives    Consultations:  None    Procedures/Studies: CT ABDOMEN PELVIS WO CONTRAST  Result Date: 03/10/2020 CLINICAL DATA:  Nausea, vomiting EXAM: CT ABDOMEN AND PELVIS WITHOUT CONTRAST TECHNIQUE: Multidetector CT imaging of the abdomen and pelvis was performed following the standard protocol without IV contrast. COMPARISON:  None.  FINDINGS: Lower chest: Moderate-sized hiatal hernia. Peribronchial thickening in the lower lobes. Bibasilar opacities, likely atelectasis. No effusions. Hepatobiliary: Small layering gallstones within the gallbladder. No focal hepatic abnormality. Pancreas: No focal abnormality or ductal dilatation. Spleen: No focal abnormality.  Normal size. Adrenals/Urinary Tract: Small bilateral renal cysts. Renovascular calcifications. No ureteral stones or hydronephrosis. Adrenal glands and urinary bladder unremarkable. Stomach/Bowel: Colonic diverticulosis. No active diverticulitis. Normal appendix. Stomach and small bowel decompressed, unremarkable. Vascular/Lymphatic: Aortic atherosclerosis. No evidence of aneurysm or adenopathy. Reproductive: Prior hysterectomy.  No adnexal masses. Other: No free fluid or free air. Musculoskeletal: No acute bony abnormality. IMPRESSION: Peribronchial thickening in the lower lobes.  Bibasilar atelectasis. Moderate-sized hiatal hernia. Cholelithiasis. Colonic diverticulosis.  No active diverticulitis. Aortic atherosclerosis. Electronically Signed   By: Rolm Baptise M.D.   On: 03/10/2020 01:58      Discharge Exam: Vitals:   03/10/20 2050 03/11/20 0440  BP: (!) 148/61 (!) 153/64  Pulse: 63 63  Resp: 16 18  Temp: 99.2 F (37.3 C) 98 F (36.7 C)  SpO2: 98% 94%    General: Pt is alert, awake, not in acute distress Cardiovascular: RRR, S1/S2 +, no edema Respiratory: CTA bilaterally, no wheezing, no rhonchi, no respiratory distress, no conversational dyspnea  Abdominal: Soft, NT, ND, bowel sounds + Extremities: no edema, no cyanosis Psych: Normal mood and affect, stable judgement and insight     The results of significant diagnostics from this hospitalization (including imaging, microbiology, ancillary and laboratory) are listed below for reference.     Microbiology: Recent Results (from the past 240 hour(s))  Resp Panel by RT-PCR (Flu A&B, Covid) Nasopharyngeal Swab      Status: None   Collection Time: 03/10/20  3:34 AM   Specimen: Nasopharyngeal Swab; Nasopharyngeal(NP) swabs in vial transport medium  Result Value Ref Range Status   SARS Coronavirus 2 by RT PCR NEGATIVE NEGATIVE Final    Comment: (NOTE) SARS-CoV-2 target nucleic acids are NOT DETECTED.  The SARS-CoV-2 RNA is generally detectable in upper respiratory specimens during the acute phase of infection. The lowest concentration of SARS-CoV-2 viral copies this assay can detect is 138 copies/mL. A negative result does not preclude SARS-Cov-2 infection and should not be used as the sole basis for treatment or other patient management decisions. A negative result may occur with  improper specimen collection/handling, submission of specimen other than nasopharyngeal swab, presence of viral mutation(s) within the areas targeted by this assay, and inadequate number of viral copies(<138 copies/mL). A negative result  must be combined with clinical observations, patient history, and epidemiological information. The expected result is Negative.  Fact Sheet for Patients:  EntrepreneurPulse.com.au  Fact Sheet for Healthcare Providers:  IncredibleEmployment.be  This test is no t yet approved or cleared by the [place] FDA and  has been authorized for detection and/or diagnosis of SARS-CoV-2 by FDA under an Emergency Use Authorization (EUA). This EUA will remain  in effect (meaning this test can be used) for the duration of the COVID-19 declaration under Section 564(b)(1) of the Act, 21 U.S.C.section 360bbb-3(b)(1), unless the authorization is terminated  or revoked sooner.       Influenza A by PCR NEGATIVE NEGATIVE Final   Influenza B by PCR NEGATIVE NEGATIVE Final    Comment: (NOTE) The Xpert Xpress SARS-CoV-2/FLU/RSV plus assay is intended as an aid in the diagnosis of influenza from Nasopharyngeal swab specimens and should not be used as a sole basis  for treatment. Nasal washings and aspirates are unacceptable for Xpert Xpress SARS-CoV-2/FLU/RSV testing.  Fact Sheet for Patients: EntrepreneurPulse.com.au  Fact Sheet for Healthcare Providers: IncredibleEmployment.be  This test is not yet approved or cleared by the [place] FDA and has been authorized for detection and/or diagnosis of SARS-CoV-2 by FDA under an Emergency Use Authorization (EUA). This EUA will remain in effect (meaning this test can be used) for the duration of the COVID-19 declaration under Section 564(b)(1) of the Act, 21 U.S.C. section 360bbb-3(b)(1), unless the authorization is terminated or revoked.  Performed at [facility], [place] [address], [place], [place] [address]      Labs: BNP (last 3 results) No results for input(s): BNP in the last 8760 hours. Basic Metabolic Panel: Recent Labs  Lab 03/09/20 2253 03/11/20 0541  NA 138 138  K 2.8* 3.4*  CL 103 105  CO2 25 21*  GLUCOSE 133* 116*  BUN 24* 22  CREATININE 1.78* 1.67*  CALCIUM 8.5* 8.3*  MG 2.1 1.9   Liver Function Tests: Recent Labs  Lab 03/09/20 2253  AST 21  ALT 16  ALKPHOS 57  BILITOT 0.5  PROT 6.8  ALBUMIN 3.5   No results for input(s): LIPASE, AMYLASE in the last 168 hours. No results for input(s): AMMONIA in the last 168 hours. CBC: Recent Labs  Lab 03/09/20 2253  WBC 8.1  NEUTROABS 6.3  HGB 10.0*  HCT 31.3*  MCV 92.9  PLT 339   Cardiac Enzymes: No results for input(s): CKTOTAL, CKMB, CKMBINDEX, TROPONINI in the last 168 hours. BNP: Invalid input(s): POCBNP CBG: Recent Labs  Lab 03/10/20 1710 03/10/20 2047 03/11/20 0756  GLUCAP 201* 114* 108*   D-Dimer No results for input(s): DDIMER in the last 72 hours. Hgb A1c No results for input(s): HGBA1C in the last 72 hours. Lipid Profile No results for input(s): CHOL, HDL, LDLCALC, TRIG, CHOLHDL, LDLDIRECT in the last 72 hours. Thyroid function  studies No results for input(s): TSH, T4TOTAL, T3FREE, THYROIDAB in the last 72 hours.  Invalid input(s): FREET3 Anemia work up No results for input(s): VITAMINB12, FOLATE, FERRITIN, TIBC, IRON, RETICCTPCT in the last 72 hours. Urinalysis    Component Value Date/Time   COLORURINE YELLOW 03/10/2020 0330   APPEARANCEUR CLEAR 03/10/2020 0330   LABSPEC 1.012 03/10/2020 0330   PHURINE 6.0 03/10/2020 0330   GLUCOSEU NEGATIVE 03/10/2020 0330   HGBUR NEGATIVE 03/10/2020 0330   BILIRUBINUR NEGATIVE 03/10/2020 0330   KETONESUR NEGATIVE 03/10/2020 0330   PROTEINUR NEGATIVE 03/10/2020 0330   NITRITE NEGATIVE 03/10/2020 0330   LEUKOCYTESUR  NEGATIVE 03/10/2020 0330   Sepsis Labs Invalid input(s): PROCALCITONIN,  WBC,  LACTICIDVEN Microbiology Recent Results (from the past 240 hour(s))  Resp Panel by RT-PCR (Flu A&B, Covid) Nasopharyngeal Swab     Status: None   Collection Time: 03/10/20  3:34 AM   Specimen: Nasopharyngeal Swab; Nasopharyngeal(NP) swabs in vial transport medium  Result Value Ref Range Status   SARS Coronavirus 2 by RT PCR NEGATIVE NEGATIVE Final    Comment: (NOTE) SARS-CoV-2 target nucleic acids are NOT DETECTED.  The SARS-CoV-2 RNA is generally detectable in upper respiratory specimens during the acute phase of infection. The lowest concentration of SARS-CoV-2 viral copies this assay can detect is 138 copies/mL. A negative result does not preclude SARS-Cov-2 infection and should not be used as the sole basis for treatment or other patient management decisions. A negative result may occur with  improper specimen collection/handling, submission of specimen other than nasopharyngeal swab, presence of viral mutation(s) within the areas targeted by this assay, and inadequate number of viral copies(<138 copies/mL). A negative result must be combined with clinical observations, patient history, and epidemiological information. The expected result is Negative.  Fact Sheet for  Patients:  EntrepreneurPulse.com.au  Fact Sheet for Healthcare Providers:  IncredibleEmployment.be  This test is no t yet approved or cleared by the [place] FDA and  has been authorized for detection and/or diagnosis of SARS-CoV-2 by FDA under an Emergency Use Authorization (EUA). This EUA will remain  in effect (meaning this test can be used) for the duration of the COVID-19 declaration under Section 564(b)(1) of the Act, 21 U.S.C.section 360bbb-3(b)(1), unless the authorization is terminated  or revoked sooner.       Influenza A by PCR NEGATIVE NEGATIVE Final   Influenza B by PCR NEGATIVE NEGATIVE Final    Comment: (NOTE) The Xpert Xpress SARS-CoV-2/FLU/RSV plus assay is intended as an aid in the diagnosis of influenza from Nasopharyngeal swab specimens and should not be used as a sole basis for treatment. Nasal washings and aspirates are unacceptable for Xpert Xpress SARS-CoV-2/FLU/RSV testing.  Fact Sheet for Patients: EntrepreneurPulse.com.au  Fact Sheet for Healthcare Providers: IncredibleEmployment.be  This test is not yet approved or cleared by the [place] FDA and has been authorized for detection and/or diagnosis of SARS-CoV-2 by FDA under an Emergency Use Authorization (EUA). This EUA will remain in effect (meaning this test can be used) for the duration of the COVID-19 declaration under Section 564(b)(1) of the Act, 21 U.S.C. section 360bbb-3(b)(1), unless the authorization is terminated or revoked.  Performed at [facility], [place] [address], [place], [place] [address]      Patient was seen and examined on the day of discharge and was found to be in stable condition. Time coordinating discharge: 25 minutes including assessment and coordination of care, as well as examination of the patient.   SIGNED:  Dessa Phi, DO [facility] 03/11/2020, 8:51  AM

## 2020-03-11 NOTE — Discharge Instructions (Signed)
Hypokalemia Hypokalemia means that the amount of potassium in the blood is lower than normal. Potassium is a chemical (electrolyte) that helps regulate the amount of fluid in the body. It also stimulates muscle tightening (contraction) and helps nerves work properly. Normally, most of the body's potassium is inside cells, and only a very small amount is in the blood. Because the amount in the blood is so small, minor changes to potassium levels in the blood can be life-threatening. What are the causes? This condition may be caused by:  Antibiotic medicine.  Diarrhea or vomiting. Taking too much of a medicine that helps you have a bowel movement (laxative) can cause diarrhea and lead to hypokalemia.  Chronic kidney disease (CKD).  Medicines that help the body get rid of excess fluid (diuretics).  Eating disorders, such as bulimia.  Low magnesium levels in the body.  Sweating a lot. What are the signs or symptoms? Symptoms of this condition include:  Weakness.  Constipation.  Fatigue.  Muscle cramps.  Mental confusion.  Skipped heartbeats or irregular heartbeat (palpitations).  Tingling or numbness. How is this diagnosed? This condition is diagnosed with a blood test. How is this treated? This condition may be treated by:  Taking potassium supplements by mouth.  Adjusting the medicines that you take.  Eating more foods that contain a lot of potassium. If your potassium level is very low, you may need to get potassium through an IV and be monitored in the hospital. Follow these instructions at home:   Take over-the-counter and prescription medicines only as told by your health care provider. This includes vitamins and supplements.  Eat a healthy diet. A healthy diet includes fresh fruits and vegetables, whole grains, healthy fats, and lean proteins.  If instructed, eat more foods that contain a lot of potassium. This includes: ? Nuts, such as peanuts and  pistachios. ? Seeds, such as sunflower seeds and pumpkin seeds. ? Peas, lentils, and lima beans. ? Whole grain and bran cereals and breads. ? Fresh fruits and vegetables, such as apricots, avocado, bananas, cantaloupe, kiwi, oranges, tomatoes, asparagus, and potatoes. ? Orange juice. ? Tomato juice. ? Red meats. ? Yogurt.  Keep all follow-up visits as told by your health care provider. This is important. Contact a health care provider if you:  Have weakness that gets worse.  Feel your heart pounding or racing.  Vomit.  Have diarrhea.  Have diabetes (diabetes mellitus) and you have trouble keeping your blood sugar (glucose) in your target range. Get help right away if you:  Have chest pain.  Have shortness of breath.  Have vomiting or diarrhea that lasts for more than 2 days.  Faint. Summary  Hypokalemia means that the amount of potassium in the blood is lower than normal.  This condition is diagnosed with a blood test.  Hypokalemia may be treated by taking potassium supplements, adjusting the medicines that you take, or eating more foods that are high in potassium.  If your potassium level is very low, you may need to get potassium through an IV and be monitored in the hospital. This information is not intended to replace advice given to you by your health care provider. Make sure you discuss any questions you have with your health care provider. Document Revised: 10/23/2017 Document Reviewed: 10/23/2017 Elsevier Patient Education  [place].   Nausea and Vomiting, Adult Nausea is feeling sick to your stomach or feeling that you are about to throw up (vomit). Vomiting is when food in  your stomach is thrown up and out of the mouth. Throwing up can make you feel weak. It can also make you lose too much water in your body (get dehydrated). If you lose too much water in your body, you may:  Feel tired.  Feel thirsty.  Have a dry mouth.  Have cracked  lips.  Go pee (urinate) less often. Older adults and people with other diseases or a weak body defense system (immune system) are at higher risk for losing too much water in the body. If you feel sick to your stomach and you throw up, it is important to follow instructions from your doctor about how to take care of yourself. Follow these instructions at home: Watch your symptoms for any changes. Tell your doctor about them. Follow these instructions to care for yourself at home. Eating and drinking      Take an ORS (oral rehydration solution). This is a drink that is sold at pharmacies and stores.  Drink clear fluids in small amounts as you are able, such as: ? Water. ? Ice chips. ? Fruit juice that has water added (diluted fruit juice). ? Low-calorie sports drinks.  Eat bland, easy-to-digest foods in small amounts as you are able, such as: ? Bananas. ? Applesauce. ? Rice. ? Low-fat (lean) meats. ? Toast. ? Crackers.  Avoid drinking fluids that have a lot of sugar or caffeine in them. This includes energy drinks, sports drinks, and soda.  Avoid alcohol.  Avoid spicy or fatty foods. General instructions  Take over-the-counter and prescription medicines only as told by your doctor.  Drink enough fluid to keep your pee (urine) pale yellow.  Wash your hands often with soap and water. If you cannot use soap and water, use hand sanitizer.  Make sure that all people in your home wash their hands well and often.  Rest at home while you get better.  Watch your condition for any changes.  Take slow and deep breaths when you feel sick to your stomach.  Keep all follow-up visits as told by your doctor. This is important. Contact a doctor if:  Your symptoms get worse.  You have new symptoms.  You have a fever.  You cannot drink fluids without throwing up.  You feel sick to your stomach for more than 2 days.  You feel light-headed or dizzy.  You have a  headache.  You have muscle cramps.  You have a rash.  You have pain while peeing. Get help right away if:  You have pain in your chest, neck, arm, or jaw.  You feel very weak or you pass out (faint).  You throw up again and again.  You have throw up that is bright red or looks like black coffee grounds.  You have bloody or black poop (stools) or poop that looks like tar.  You have a very bad headache, a stiff neck, or both.  You have very bad pain, cramping, or bloating in your belly (abdomen).  You have trouble breathing.  You are breathing very quickly.  Your heart is beating very quickly.  Your skin feels cold and clammy.  You feel confused.  You have signs of losing too much water in your body, such as: ? Dark pee, very little pee, or no pee. ? Cracked lips. ? Dry mouth. ? Sunken eyes. ? Sleepiness. ? Weakness. These symptoms may be an emergency. Do not wait to see if the symptoms will go away. Get medical help right away.  Call your local emergency services (911 in the [place] Do not drive yourself to the hospital. Summary  Nausea is feeling sick to your stomach or feeling that you are about to throw up (vomit). Vomiting is when food in your stomach is thrown up and out of the mouth.  Follow instructions from your doctor about eating and drinking to keep from losing too much water in your body.  Take over-the-counter and prescription medicines only as told by your doctor.  Contact your doctor if your symptoms get worse or you have new symptoms.  Keep all follow-up visits as told by your doctor. This is important. This information is not intended to replace advice given to you by your health care provider. Make sure you discuss any questions you have with your health care provider. Document Revised: 07/04/2018 Document Reviewed: 08/20/2017 Elsevier Patient Education  [place].

## 2020-03-11 NOTE — Progress Notes (Signed)
ANTICOAGULATION CONSULT NOTE - Follow Up Consult  Pharmacy Consult for warfarin Indication: hx atrial fibrillation  Allergies  Allergen Reactions  . Sulfa Antibiotics Hives    Patient Measurements: Height: 5\' 5"  (165.1 cm) Weight: 94.8 kg (209 lb) IBW/kg (Calculated) : 57 Heparin Dosing Weight:   Vital Signs: Temp: 98 F (36.7 C) (12/17 0440) Temp Source: Oral (12/17 0440) BP: 153/64 (12/17 0440) Pulse Rate: 63 (12/17 0440)  Labs: Recent Labs    03/09/20 2253 03/09/20 2336 03/10/20 0827 03/10/20 1218 03/11/20 0541  HGB 10.0*  --   --   --   --   HCT 31.3*  --   --   --   --   PLT 339  --   --   --   --   APTT  --   --   --  34  --   LABPROT  --  18.5*  --   --  21.2*  INR  --  1.6*  --   --  1.9*  CREATININE 1.78*  --   --   --  1.67*  TROPONINIHS  --   --  12  --   --     Estimated Creatinine Clearance: 33.1 mL/min (A) (by C-G formula based on SCr of 1.67 mg/dL (H)).   Medications:  PTA warfarin regimen: 4mg  daily  Assessment: Patient's a 75 y.o F with hx afib on warfarin PTA presented to the ED on 12/15 with c/o n/v and generalized weakness.  Warfarin resumed on admission.  Today, 03/11/2020: - INR is slightly sub-therapeutic at 1.9 - hgb 10 and plts 339 on 12/15 - no bleeding documented - no signif. drug-drug intxn - on carb modified diet  Goal of Therapy:  INR 2-3 Monitor platelets by anticoagulation protocol: Yes   Plan:  - warfarin 4mg  PO x1 - daily INR - monitor for s/sx bleeding  [place] Phifer P 03/11/2020,7:19 AM

## 2020-03-11 NOTE — Progress Notes (Addendum)
Discharge instruction and education provided to the patient and family. All questions answered and clarified. Patient verbalizes understanding and acceptance.

## 2020-03-11 NOTE — Care Management Obs Status (Signed)
[place] NOTIFICATION   Patient Details  Name: Gina Leblond MRN: [id number] Date of Birth: 07/01/1945   Medicare Observation Status Notification Given:  Yes    MahabirJuliann Pulse, RN 03/11/2020, 9:07 AM

## 2020-04-14 ENCOUNTER — Telehealth: Payer: Self-pay | Admitting: *Deleted

## 2020-04-14 NOTE — Telephone Encounter (Signed)
Notes received sent to referral for scheduling [facility] [phone]

## 2020-04-20 NOTE — Progress Notes (Unsigned)
Cardiology Office Note:   Date:  04/21/2020  NAME:  Destiny Rice    MRN: [id number] DOB:  02/08/1946   PCP:  Destiny Maples, MD  Cardiologist:  No primary care provider on file.   Referring MD: Destiny Rice*   Chief Complaint  Patient presents with  . Coronary Artery Disease   History of Present Illness:   Destiny Rice is a 75 y.o. female with a hx of CAD, HTN, HLD, DM, carotid artery disease s/p CEA, MV repair, cardiac tumor excision, pAF on warfarin who is being seen today for the evaluation of CAD at the request of Destiny Maples, MD.  She [place] [facility].  She was under the care of a Dr. Pollyann Samples at [facility].  She apparently has had multiple heart stents.  She does present with 2 cards.  She had a mitral valve annuloplasty on December 09, 2012.  She also under went a transcatheter aortic valve replacement with a 26 mm evolute pro valve on 04/24/2018.  She also has undergone left carotid endarterectomy.  Her right ICA is 100% occluded.  She also reports a multitude of heart stents in the past.  She is confusing her recent TAVR with stents as well.  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  We do need the records and results.  She did see Dr. Bishop Limbo in [facility] but reports she lives in [place] and would like to come here.  She also has history of paroxysmal atrial fibrillation and is on Coumadin and amiodarone.  Her EKG shows sinus rhythm.  No dizziness reported.  No chest pain reported.  She needs an updated echocardiogram.  We discussed switching to Eliquis.  She is okay to do so.  I think this is beneficial to get off Coumadin.  She is not taking aspirin.  She was instructed not to take this.  Unclear reasons why.  Again with the records.  She seems to be maintaining sinus on amiodarone but I do not think is a good  long-term option for her.  Medical history also significant for diabetes but her A1c is 5.6.  She also has hypertension.  BP 142/76 today.  Seems to be well controlled.  Most recent LDL cholesterol 102.  Most recent HDL 103.  It will be interesting to see the results of her most recent heart catheterizations.  She seems to have already established with a vascular surgeon and he is following her carotids.  She does not smoke, drink alcohol or use drugs.  She is a retired Secretary/administrator.  She presents with her husband.  They are married have 1 son.  She reports her grandmother had a stroke.  No strong family history of heart disease.  Problem List 1. CAD -prior PCI 2. Cardiac tumor 3. Mitral Valve Repair -26 mm Edwards annuloplasty ring  -12/09/2012 4. Severe aortic stenosis s/p 26 mm Evolut Pro -04/24/2018 4. Paroxysmal Afib 5. Carotid Artery Disease s/p L CEA -R ICA 100% 6. HTN 7. DM -A1c 5.6 8. HLD -T chol 221, TG 79, LDL 102, HDL 103  Past Medical History: Past Medical History:  Diagnosis Date  . AF (paroxysmal atrial fibrillation) ([place]) 03/10/2020  . Chronic kidney disease, stage 3b ([place]) 03/10/2020  . Coronary artery disease   . Hypertension   . Hypothyroidism 03/10/2020  . Mixed hyperlipidemia due to type 2  diabetes mellitus ([place]) 03/10/2020  . Renal disorder   . Type 2 diabetes mellitus with stage 3b chronic kidney disease, without long-term current use of insulin ([place]) 03/10/2020    Past Surgical History: Past Surgical History:  Procedure Laterality Date  . CAROTID ENDARTERECTOMY  2021  . CORONARY ANGIOPLASTY WITH STENT PLACEMENT     x10  . MITRAL VALVE REPAIR  12/09/2012  . TRANSCATHETER AORTIC VALVE REPLACEMENT, 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    Current Medications: Current Meds  Medication Sig  . ALPRAZolam (XANAX) 0.25 MG tablet Take 0.25 mg by mouth at bedtime as needed for anxiety.  Marland Kitchen amiodarone (PACERONE) 200 MG tablet Take 200 mg by mouth daily.  Marland Kitchen apixaban  (ELIQUIS) 5 MG TABS tablet Take 1 tablet (5 mg total) by mouth 2 (two) times daily.  Marland Kitchen atorvastatin (LIPITOR) 20 MG tablet Take 20 mg by mouth daily.  . carvedilol (COREG) 25 MG tablet Take 37.5 mg by mouth 2 (two) times daily with a meal. Take 1 Tablet in the Morning and 1/2 Tablet at Night  . cholecalciferol (VITAMIN D3) 25 MCG (1000 UNIT) tablet Take 1,000 Units by mouth daily.  . folic acid (FOLVITE) 1 MG tablet Take 1 mg by mouth daily.  . hydrochlorothiazide (HYDRODIURIL) 25 MG tablet Take 25 mg by mouth daily.  . isosorbide mononitrate (ISMO) 20 MG tablet Take 20 mg by mouth daily.  Marland Kitchen KLOR-CON M20 20 MEQ tablet Take 20 mEq by mouth daily.  Marland Kitchen levothyroxine (SYNTHROID) 25 MCG tablet Take 25 mcg by mouth daily before breakfast.  . losartan (COZAAR) 100 MG tablet Take 100 mg by mouth daily.  Marland Kitchen losartan-hydrochlorothiazide (HYZAAR) 100-25 MG tablet Take 1 tablet by mouth daily.  . meclizine (ANTIVERT) 12.5 MG tablet Take by mouth.  . nitroGLYCERIN (NITROSTAT) 0.4 MG SL tablet Place 0.4 mg under the tongue every 5 (five) minutes as needed for chest pain.  . pantoprazole (PROTONIX) 20 MG tablet Take 20 mg by mouth daily.  . vitamin B-12 (CYANOCOBALAMIN) 1000 MCG tablet Take 1,000 mcg by mouth daily.  . [DISCONTINUED] warfarin (COUMADIN) 4 MG tablet Take 4 mg by mouth daily.     Allergies:    Iodine-131 and Sulfa antibiotics   Social History: Social History   Socioeconomic History  . Marital status: Married    Spouse name: Not on file  . Number of children: 1  . Years of education: Not on file  . Highest education level: Not on file  Occupational History  . Occupation: retired  Tobacco Use  . Smoking status: Never Smoker  . Smokeless tobacco: Never Used  Substance and Sexual Activity  . Alcohol use: Never  . Drug use: Never  . Sexual activity: Not on file  Other Topics Concern  . Not on file  Social History Narrative  . Not on file   Social Determinants of Health    Financial Resource Strain: Not on file  Food Insecurity: Not on file  Transportation Needs: Not on file  Physical Activity: Not on file  Stress: Not on file  Social Connections: Not on file     Family History: The patient's family history includes Stroke in her maternal grandmother.  ROS:   All other ROS reviewed and negative. Pertinent positives noted in the HPI.     EKGs/Labs/Other Studies Reviewed:   The following studies were personally reviewed by me today:  EKG:  EKG is ordered today.  The ekg ordered today demonstrates sinus bradycardia heart rate 58, first-degree AV block  up to 240 ms poor R progression noted, nonspecific ST-T changes, and was personally reviewed by me.   Recent Labs: 03/09/2020: ALT 16; Hemoglobin 10.0; Platelets 339 03/11/2020: BUN 22; Creatinine, Ser 1.67; Magnesium 1.9; Potassium 3.4; Sodium 138   Recent Lipid Panel No results found for: CHOL, TRIG, HDL, CHOLHDL, VLDL, LDLCALC, LDLDIRECT  Physical Exam:   VS:  BP (!) 142/76   Pulse (!) 58   Ht 5\' 5"  (1.651 m)   Wt 216 lb (98 kg)   SpO2 96%   BMI 35.94 kg/m    Wt Readings from Last 3 Encounters:  04/21/20 216 lb (98 kg)  03/09/20 209 lb (94.8 kg)    General: Well nourished, well developed, in no acute distress Head: Atraumatic, normal size  Eyes: PEERLA, EOMI  Neck: Supple, no JVD Endocrine: No thryomegaly  Cardiac: Normal S1, S2; RRR; 2 out of 6 systolic ejection murmur Lungs: Clear to auscultation bilaterally, no wheezing, rhonchi or rales  Abd: Soft, nontender, no hepatomegaly  Ext: No edema, pulses 2+ Musculoskeletal: No deformities, BUE and BLE strength normal and equal Skin: Warm and dry, no rashes   Neuro: Alert and oriented to person, place, time, and situation, CNII-XII grossly intact, no focal deficits  Psych: Normal mood and affect   ASSESSMENT:   JALIYA SIEGMANN is a 75 y.o. female who presents for the following: 1. Coronary artery disease involving native coronary  artery of native heart without angina pectoris   2. Mixed hyperlipidemia   3. Carotid artery disease, unspecified laterality, unspecified type ([place])   4. Paroxysmal atrial fibrillation ([place])   5. Primary hypertension   6. S/P TAVR (transcatheter aortic valve replacement)   7. S/P mitral valve repair     PLAN:   1. Coronary artery disease involving native coronary artery of native heart without angina pectoris 2. Mixed hyperlipidemia -History of stents per her report.  No stent cards with her today.  She is not on aspirin.  I need the records from her cardiologist in [facility].  We will reach out to them to get records.  We will likely add back aspirin if we are able. -I will likely increase her Lipitor to get her LDL less than 70.  3. Carotid artery disease, unspecified laterality, unspecified type ([place]) -Status post left carotid arterectomy.  20% occluded right internal carotid artery.  Followed by vascular surgery.  4. Paroxysmal atrial fibrillation ([facility]) -History of paroxysmal A. fib.  Maintained on amiodarone.  She is in sinus rhythm today.  Likely will stop her amiodarone in the coming months.  We will switch her to Eliquis today.  Stop Coumadin.  Eliquis is a much better drug for her and safer.  5. Primary hypertension -Blood pressure well controlled.  Continue Coreg 25 mg twice a day, HCTZ 25 mg daily, Imdur 20 mg daily, losartan 100 mg daily  6. S/P TAVR (transcatheter aortic valve replacement) -Status post TAVR with 26 mm evolute pro valve.  04/24/2018.  We have requested records from [facility].  She needs an echocardiogram.  7. S/P mitral valve repair -Mitral valve repair in 2014.  In [place].  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  I recommended records.  Echocardiogram to evaluate repair.  Disposition: Return in about 6 months (around 10/19/2020).  Medication Adjustments/Labs and Tests Ordered: Current medicines are reviewed at length with the  patient today.  Concerns regarding medicines are outlined above.  Orders Placed This Encounter  Procedures  . ECHOCARDIOGRAM COMPLETE  Meds ordered this encounter  Medications  . apixaban (ELIQUIS) 5 MG TABS tablet    Sig: Take 1 tablet (5 mg total) by mouth 2 (two) times daily.    Dispense:  60 tablet    Refill:  3    Patient Instructions  Medication Instructions:  Stop COUMADIN Start Eliquis 5 mg twice daily   *If you need a refill on your cardiac medications before your next appointment, please call your pharmacy*  Testing/Procedures: Echocardiogram - Your physician has requested that you have an echocardiogram. Echocardiography is a painless test that uses sound waves to create images of your heart. It provides your doctor with information about the size and shape of your heart and how well your heart's chambers and valves are working. This procedure takes approximately one hour. There are no restrictions for this procedure. This will be performed at our [facility] location - [address], [address].    Follow-Up: At [facility], you and your health needs are our priority.  As part of our continuing mission to provide you with exceptional heart care, we have created designated Provider Care Teams.  These Care Teams include your primary Cardiologist (physician) and Advanced Practice Providers (APPs -  Physician Assistants and Nurse Practitioners) who all work together to provide you with the care you need, when you need it.  We recommend signing up for the patient portal called "MyChart".  Sign up information is provided on this After Visit Summary.  MyChart is used to connect with patients for Virtual Visits (Telemedicine).  Patients are able to view lab/test results, encounter notes, upcoming appointments, etc.  Non-urgent messages can be sent to your provider as well.   To learn more about what you can do with MyChart, go to [email or web address].    Your next  appointment:   6 month(s)  The format for your next appointment:   In Person  Provider:   Eleonore Chiquito, MD     Signed, Addison Naegeli. Audie Box, [place]  [address] [place], [place] [address] [phone]  04/21/2020 3:04 PM

## 2020-04-21 ENCOUNTER — Ambulatory Visit: Payer: Medicare HMO | Admitting: Cardiovascular Disease

## 2020-04-21 ENCOUNTER — Other Ambulatory Visit: Payer: Self-pay

## 2020-04-21 ENCOUNTER — Encounter: Payer: Self-pay | Admitting: Cardiovascular Disease

## 2020-04-21 VITALS — BP 142/76 | HR 58 | Ht 65.0 in | Wt 216.0 lb

## 2020-04-21 DIAGNOSIS — I2510 Atherosclerotic heart disease of native coronary artery without angina pectoris: Secondary | ICD-10-CM

## 2020-04-21 DIAGNOSIS — I779 Disorder of arteries and arterioles, unspecified: Secondary | ICD-10-CM

## 2020-04-21 DIAGNOSIS — Z952 Presence of prosthetic heart valve: Secondary | ICD-10-CM

## 2020-04-21 DIAGNOSIS — Z9889 Other specified postprocedural states: Secondary | ICD-10-CM

## 2020-04-21 DIAGNOSIS — I480 Paroxysmal atrial fibrillation: Secondary | ICD-10-CM | POA: Diagnosis not present

## 2020-04-21 DIAGNOSIS — E782 Mixed hyperlipidemia: Secondary | ICD-10-CM

## 2020-04-21 DIAGNOSIS — I10 Essential (primary) hypertension: Secondary | ICD-10-CM

## 2020-04-21 MED ORDER — APIXABAN 5 MG PO TABS: 5.0000 mg | ORAL_TABLET | Freq: Two times a day (BID) | ORAL | 3 refills | Status: DC

## 2020-04-21 NOTE — Addendum Note (Signed)
Addended by: Caprice Beaver T on: 04/21/2020 04:10 PM   Modules accepted: Orders

## 2020-04-21 NOTE — Patient Instructions (Signed)
Medication Instructions:  Stop COUMADIN Start Eliquis 5 mg twice daily   *If you need a refill on your cardiac medications before your next appointment, please call your pharmacy*  Testing/Procedures: Echocardiogram - Your physician has requested that you have an echocardiogram. Echocardiography is a painless test that uses sound waves to create images of your heart. It provides your doctor with information about the size and shape of your heart and how well your heart's chambers and valves are working. This procedure takes approximately one hour. There are no restrictions for this procedure. This will be performed at our [facility] location - [address], [address].    Follow-Up: At [facility], you and your health needs are our priority.  As part of our continuing mission to provide you with exceptional heart care, we have created designated Provider Care Teams.  These Care Teams include your primary Cardiologist (physician) and Advanced Practice Providers (APPs -  Physician Assistants and Nurse Practitioners) who all work together to provide you with the care you need, when you need it.  We recommend signing up for the patient portal called "MyChart".  Sign up information is provided on this After Visit Summary.  MyChart is used to connect with patients for Virtual Visits (Telemedicine).  Patients are able to view lab/test results, encounter notes, upcoming appointments, etc.  Non-urgent messages can be sent to your provider as well.   To learn more about what you can do with MyChart, go to [email or web address].    Your next appointment:   6 month(s)  The format for your next appointment:   In Person  Provider:   Eleonore Chiquito, MD

## 2020-04-21 NOTE — Addendum Note (Signed)
Addended by: Merri Ray A on: 04/21/2020 03:37 PM   Modules accepted: Orders

## 2020-04-26 ENCOUNTER — Telehealth: Payer: Self-pay | Admitting: Cardiovascular Disease

## 2020-04-26 NOTE — Telephone Encounter (Signed)
Pt c/o medication issue:  1. Name of Medication: apixaban (ELIQUIS) 5 MG TABS tablet [[id number]]   2. How are you currently taking this medication (dosage and times per day)? apixaban (ELIQUIS) 5 MG TABS tablet [[id number]]    3. Are you having a reaction (difficulty breathing--STAT)?   4. What is your medication issue?   Pt stated this med and woke up in the middle of the night with the feeling of someone sitting on her chest    Best number [phone]   Pt c/o of Chest Pain: STAT if CP now or developed within 24 hours  1. Are you having CP right now?  The feeling of someone sitting on her chest   2. Are you experiencing any other symptoms (ex. SOB, nausea, vomiting, sweating)? A little bit of SOB , Burping   3. How long have you been experiencing CP? Since last night  4. Is your CP continuous or coming and going?  Continuous  5. Have you taken Nitroglycerin?  No  ?

## 2020-04-26 NOTE — Telephone Encounter (Signed)
Agree. Thanks!  Lake Bells T. Audie Box, MD, [place]  [address], [place], [place] [address] [phone]  12:52 PM

## 2020-04-26 NOTE — Telephone Encounter (Signed)
Spoke with pt, she was started on eliquis last week and last night she woke and it felt like something was sitting on her chest. She sat up, was belching and it helped with the tightness. This morning she continues to have a tightness in the chest that improves with belching. She has read the pharmacy info regarding the eliquis and is concerned she maybe having a reaction. Reassurance given to the patient that eliquis is for her protection. Encouraged her to take an antiacid or gas medication to see if that helps with the tightness as it does not sound like her heart. Pt agreed with this plan. And wants dr Audie Box to know what is going on. Will forward for his review.

## 2020-04-26 NOTE — Telephone Encounter (Signed)
Spoke with pt, she is feeling a lot better since she took the antiacids and gas pills.

## 2020-05-06 ENCOUNTER — Other Ambulatory Visit: Payer: Self-pay

## 2020-05-06 ENCOUNTER — Ambulatory Visit (HOSPITAL_COMMUNITY): Payer: Medicare HMO | Attending: Cardiology

## 2020-05-06 DIAGNOSIS — Z952 Presence of prosthetic heart valve: Secondary | ICD-10-CM

## 2020-05-07 LAB — ECHOCARDIOGRAM COMPLETE
AR max vel: 1.34 cm2
AV Area VTI: 1.51 cm2
AV Area mean vel: 1.35 cm2
AV Mean grad: 11 mmHg
AV Peak grad: 18 mmHg
Ao pk vel: 2.12 m/s
Area-P 1/2: 2.84 cm2
MV VTI: 1.33 cm2
S' Lateral: 2.3 cm

## 2020-07-05 ENCOUNTER — Telehealth: Payer: Self-pay | Admitting: Cardiovascular Disease

## 2020-07-05 NOTE — Telephone Encounter (Signed)
New Message:  Please call, pt says she is passing blood in her urine. Her medical doctor told her to call her Cardiologist asap, her hemoglobin is dropping.

## 2020-07-05 NOTE — Telephone Encounter (Signed)
Yes, please tell her to stop her Eliquis.  I hope she is getting this evaluated.  Lake Bells T. Audie Box, MD, [place]  [address] [place], [place] [address] [phone]  1:16 PM

## 2020-07-05 NOTE — Telephone Encounter (Signed)
Spoke to patient Dr.[name] advised to stop Eliquis.Advised to be evaluated with PCP.Stated PCP is mailing a kit to check for blood in stool.She is suppose to take back to her office with a urine specimen.Advised to call back after she is evaluated.Patient stated she does not understand why she does not need appointment to see Dr.[name].Tried to explain PCP follows.

## 2020-07-05 NOTE — Telephone Encounter (Signed)
Spoke to patient she stated her PCP wanted her to let Dr.[name] know her hgb is dropping and she has bright red blood in urine.Hgb 9.8 checked 07/04/20. She wants to know if she can stop Eliquis.Advised I will send message to Dr.[name] for advice.

## 2020-09-01 ENCOUNTER — Other Ambulatory Visit: Payer: Self-pay | Admitting: Cardiovascular Disease

## 2020-09-01 NOTE — Telephone Encounter (Signed)
WILL ROUTE TO NL ANTICOAG SO EVERYONE CAN KEEP AN EYE OUT IF I AM NOT HERE TO CLARIFY IF PT TAKING ELIQUIS OR NOT

## 2020-09-01 NOTE — Telephone Encounter (Signed)
79f, 98kg, scr 1.76 06/28/20, lovw/oneal 04/21/20. Pt requesting a refill of eliquis but I don't see eliquis on med list will route to pharmd pool as I do not wish to make a mistake

## 2020-09-01 NOTE — Telephone Encounter (Signed)
55f, 98kg, scr 1.76 06/28/20, lovw/oneal 04/21/20. Pt requesting a refill of eliquis but I don't see eliquis on med list will route to pharmd pool as I do not wish to make a mistake

## 2020-09-01 NOTE — Telephone Encounter (Signed)
Pt previously taking Eliquis for afib with CHADS2VASc score of 6 (age x2, sex, HTN, DM, CAD). She called in 07/05/20 with reports of blood in her urine, PCP currently evaluating but pt advised to stop Eliquis in the mean time. Saw [facility] provider 5/17 but don't see that anticoag was addressed, Eliquis was still on med list but provider wrote in their note that pt takes warfarin which is incorrect. Saw GI yesterday for anemia and Hgb 9.8, reported no GI bleeding but chronic fatigue. Will need to clarify with pt if she resumed Eliquis or not. If she has, can send in refill. I called pt but no answer and am off for the next week if you can keep an eye on this and call her later.

## 2020-09-06 NOTE — Telephone Encounter (Signed)
Called and spoke w/pt who stated that yes they are taking the eliquis 5mg  and they also stated that they didn't need a refill so I added to med list but refused refill because she stated that she doesn't need a refill

## 2020-09-13 ENCOUNTER — Telehealth: Payer: Self-pay

## 2020-09-13 NOTE — Telephone Encounter (Signed)
   [facility] Pre-operative Risk Assessment    Patient Name: Destiny Rice  DOB: Oct 25, 1945  MRN: [id number]   HEARTCARE STAFF: - Please ensure there is not already an duplicate clearance open for this procedure. - Under Visit Info/Reason for Call, type in Other and utilize the format Clearance MM/DD/YY or Clearance TBD. Do not use dashes or single digits. - If request is for dental extraction, please clarify the # of teeth to be extracted. - If the patient is currently at the dentist's office, call Pre-Op APP to address. If the patient is not currently in the dentist office, please route to the Pre-Op pool  Request for surgical clearance:  What type of surgery is being performed? Colonoscopy    When is this surgery scheduled? 09-29-1945   What type of clearance is required (medical clearance vs. Pharmacy clearance to hold med vs. Both)? BOTH  Are there any medications that need to be held prior to surgery and how long? Eliquis    Practice name and name of physician performing surgery? [facility]   What is the office phone number?  [phone]   7.   What is the office fax number? [phone]  8.   Anesthesia type (None, local, MAC, general) ? Unknown    Ena Dawley 09/13/2020, 3:24 PM  _________________________________________________________________   (provider comments below)

## 2020-09-14 MED ORDER — APIXABAN 5 MG PO TABS
5.0000 mg | ORAL_TABLET | Freq: Two times a day (BID) | ORAL | 5 refills | Status: DC
Start: 2020-09-14 — End: 2021-09-25

## 2020-09-14 NOTE — Telephone Encounter (Signed)
Will send back to pre op provider to please make a note if the pt has actually been cleared. I did not see in Phrm-d recommendations that pt is cleared.

## 2020-09-14 NOTE — Telephone Encounter (Signed)
Patient with diagnosis of afib on Eliquis for anticoagulation. Refill sent in to pharmacy based on med dispense report.  Procedure: colonoscopy Date of procedure: assuming 09/23/20 based on note description, below message lists pt's DOB under procedure date though  CHA2DS2-VASc Score = 6  This indicates a 9.7% annual risk of stroke. The patient's score is based upon: CHF History: No HTN History: Yes Diabetes History: Yes Stroke History: No Vascular Disease History: Yes Age Score: 2 Gender Score: 1   CrCl 63mL/min using adjusted body weight due to obesity Platelet count 304K  Per office protocol, patient can hold Eliquis for 1-2 days prior to procedure.

## 2020-09-15 NOTE — Telephone Encounter (Signed)
   Name: Destiny Rice  DOB: 05/31/1945  MRN: [id number]   Primary Cardiologist: Evalina Field, MD  Chart reviewed as part of pre-operative protocol coverage. Patient was contacted 09/15/2020 in reference to pre-operative risk assessment for pending surgery as outlined below.  Destiny Rice was last seen on 04/21/20 by Dr. Audie Box.  Since that day, Destiny Rice has done well.    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  Upon discussion with the patient, she is very upset with the timing of her colonoscopy. She wishes to cancel and wants to complete clearance with Dr. Audie Box when se sees him in August.  I will fax the requesting office and adjust appt notes to include clearance. She states she is not having active bleeding at this time and is still on eliquis. Liquid iron had improved her symptoms.    I will route this recommendation to the requesting party via Epic fax function and remove from pre-op pool. Please call with questions.  Tami Lin Ladoris Lythgoe, PA 09/15/2020, 8:54 AM

## 2020-10-27 NOTE — Progress Notes (Signed)
Cardiology Office Note:   Date:  10/28/2020  NAME:  Destiny Rice    MRN: [id number] DOB:  1945/06/08   PCP:  Sherrine Maples, MD  Cardiologist:  Evalina Field, MD  Electrophysiologist:  None   Referring MD: Rolly Salter*   Chief Complaint  Patient presents with   Follow-up    History of Present Illness:   Destiny Rice is a 75 y.o. female with a hx of CAD s/p PCI, MV repair, severe AS s/p TAVR, paroxysmal Afib, HTN, DM who presents for follow-up. Started on eliquis at our last visit. Eliquis stopped due to anemia. Will undergo colonoscopy. She reports she is still taking Eliquis.  She was evaluated and no bleeding was found.  She was informed that she does need a colonoscopy as well as EGD but is okay to continue on Eliquis.  Denies any major symptoms in office.  No chest pain or trouble breathing.  BP 166/90.  A little elevated today but she reports it is always elevated when she sees the doctor.  Most recent echocardiogram shows normal LV function.  She is a stable mitral valve repair as well as stable TAVR.  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  Recent labs done by nephrologist.  We will obtain those.  She reports he would like to put her on Farxiga.  I agree with this.  Overall doing well without major symptoms today in office.   Problem List 1. CAD -prior PCI 2. Cardiac tumor 3. Mitral Valve Repair -26 mm Edwards annuloplasty ring -12/09/2012 4. Severe aortic stenosis s/p 26 mm Evolut Pro -04/24/2018 4. Paroxysmal Afib 5. Carotid Artery Disease s/p L CEA -R ICA 100% 6. HTN 7. DM -A1c 6.5 8. HLD -T chol 221, TG 79, LDL 102, HDL 103 9. CKD III/IV -Cr 1.85 -eGFR 28  Past Medical History: Past Medical History:  Diagnosis Date   AF (paroxysmal atrial fibrillation) ([place]) 03/10/2020   Chronic kidney disease, stage 3b ([place]) 03/10/2020   Coronary artery disease    Hypertension    Hypothyroidism 03/10/2020   Mixed hyperlipidemia due to type 2 diabetes  mellitus ([place]) 03/10/2020   Renal disorder    Type 2 diabetes mellitus with stage 3b chronic kidney disease, without long-term current use of insulin ([place]) 03/10/2020    Past Surgical History: Past Surgical History:  Procedure Laterality Date   CAROTID ENDARTERECTOMY  2021   CORONARY ANGIOPLASTY WITH STENT PLACEMENT     x10   MITRAL VALVE REPAIR  12/09/2012   TRANSCATHETER AORTIC VALVE REPLACEMENT, 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    Current Medications: Current Meds  Medication Sig   ALPRAZolam (XANAX) 0.25 MG tablet Take 0.25 mg by mouth at bedtime as needed for anxiety.   amiodarone (PACERONE) 200 MG tablet Take 200 mg by mouth daily.   apixaban (ELIQUIS) 5 MG TABS tablet Take 1 tablet (5 mg total) by mouth 2 (two) times daily.   atorvastatin (LIPITOR) 20 MG tablet Take 20 mg by mouth daily.   carvedilol (COREG) 25 MG tablet Take 37.5 mg by mouth 2 (two) times daily with a meal. Take 1 Tablet in the Morning and 1/2 Tablet at Night   cholecalciferol (VITAMIN D3) 25 MCG (1000 UNIT) tablet Take 1,000 Units by mouth daily.   folic acid (FOLVITE) 1 MG tablet Take 1 mg by mouth daily.   isosorbide mononitrate (ISMO) 20 MG tablet Take 20 mg by mouth daily.   KLOR-CON M20 20 MEQ tablet Take 20 mEq  by mouth daily.   levothyroxine (SYNTHROID) 25 MCG tablet Take 25 mcg by mouth daily before breakfast.   losartan-hydrochlorothiazide (HYZAAR) 100-25 MG tablet Take 1 tablet by mouth daily.   meclizine (ANTIVERT) 12.5 MG tablet Take by mouth.   nitroGLYCERIN (NITROSTAT) 0.4 MG SL tablet Place 0.4 mg under the tongue every 5 (five) minutes as needed for chest pain.   pantoprazole (PROTONIX) 20 MG tablet Take 20 mg by mouth daily.   vitamin B-12 (CYANOCOBALAMIN) 1000 MCG tablet Take 1,000 mcg by mouth daily.     Allergies:    Iodine-131 and Sulfa antibiotics   Social History: Social History   Socioeconomic History   Marital status: Married    Spouse name: Not on file   Number of children: 1    Years of education: Not on file   Highest education level: Not on file  Occupational History   Occupation: retired  Tobacco Use   Smoking status: Never   Smokeless tobacco: Never  Substance and Sexual Activity   Alcohol use: Never   Drug use: Never   Sexual activity: Not on file  Other Topics Concern   Not on file  Social History Narrative   Not on file   Social Determinants of Health   Financial Resource Strain: Not on file  Food Insecurity: Not on file  Transportation Needs: Not on file  Physical Activity: Not on file  Stress: Not on file  Social Connections: Not on file     Family History: The patient's family history includes Stroke in her maternal grandmother.  ROS:   All other ROS reviewed and negative. Pertinent positives noted in the HPI.     EKGs/Labs/Other Studies Reviewed:   The following studies were personally reviewed by me today:  TTE 05/06/2020  1. Left ventricular ejection fraction, by estimation, is 55 to 60%. Left  ventricular ejection fraction by 3D volume is 58 %. The left ventricle has  normal function. The left ventricle has no regional wall motion  abnormalities. There is mild concentric  left ventricular hypertrophy. Left ventricular diastolic function could  not be evaluated. The average left ventricular global longitudinal strain  is -16.0 %. The global longitudinal strain is normal.   2. Right ventricular systolic function is normal. The right ventricular  size is mildly enlarged.   3. Left atrial size was mildly dilated.   4. Right atrial size was mildly dilated.   5. S/P mitral valve annuloplasty ring. The mitral valve has been  repaired/replaced. Trivial mitral valve regurgitation. Moderate mitral  stenosis. The mean mitral valve gradient is 8.0 mmHg. There is a  prosthetic annuloplasty ring present in the mitral  position.   6. The aortic valve has been repaired/replaced. Aortic valve  regurgitation is not visualized. No aortic  stenosis is present. There is a  26 mm CoreValve-Evolut Pro prosthetic (TAVR) valve present in the aortic  position. Aortic valve area, by VTI measures   1.51 cm. Aortic valve mean gradient measures 11.0 mmHg. Aortic valve  Vmax measures 2.12 m/s. Diminsionless index is 0.5. The AVA is likely  underestimated due to small LVOT.   7. The inferior vena cava is normal in size with greater than 50%  respiratory variability, suggesting right atrial pressure of 3 mmHg.   Recent Labs: 03/09/2020: ALT 16; Hemoglobin 10.0; Platelets 339 03/11/2020: BUN 22; Creatinine, Ser 1.67; Magnesium 1.9; Potassium 3.4; Sodium 138   Recent Lipid Panel No results found for: CHOL, TRIG, HDL, CHOLHDL, VLDL, LDLCALC, LDLDIRECT  Physical Exam:   VS:  BP (!) 166/90 (BP Location: Left Arm, Patient Position: Sitting, Cuff Size: Large)   Pulse 73   Ht $R'5\' 6"'Lm$  (1.676 m)   Wt 240 lb 12.8 oz (109.2 kg)   SpO2 96%   BMI 38.87 kg/m    Wt Readings from Last 3 Encounters:  10/28/20 240 lb 12.8 oz (109.2 kg)  04/21/20 216 lb (98 kg)  03/09/20 209 lb (94.8 kg)    General: Well nourished, well developed, in no acute distress Head: Atraumatic, normal size  Eyes: PEERLA, EOMI  Neck: Supple, no JVD Endocrine: No thryomegaly Cardiac: Normal S1, S2; RRR; 2 out of 6 systolic ejection murmur Lungs: Clear to auscultation bilaterally, no wheezing, rhonchi or rales  Abd: Soft, nontender, no hepatomegaly  Ext: No edema, pulses 2+ Musculoskeletal: No deformities, BUE and BLE strength normal and equal Skin: Warm and dry, no rashes   Neuro: Alert and oriented to person, place, time, and situation, CNII-XII grossly intact, no focal deficits  Psych: Normal mood and affect   ASSESSMENT:   Destiny Rice is a 75 y.o. female who presents for the following: 1. Coronary artery disease involving native coronary artery of native heart without angina pectoris   2. Mixed hyperlipidemia   3. Carotid artery disease, unspecified  laterality, unspecified type ([place])   4. Paroxysmal atrial fibrillation ([place])   5. Primary hypertension   6. S/P TAVR (transcatheter aortic valve replacement)   7. S/P mitral valve repair     PLAN:   1. Coronary artery disease involving native coronary artery of native heart without angina pectoris 2. Mixed hyperlipidemia -History of CAD and prior PCI in [place].  She reports over 10 stents.  She is on Lipitor 20 mg daily. -Review of labs show LDL 102.  HDL is exquisite.  I think this is okay to continue this for now -Concerns for anemia.  No aspirin for now.  3. Carotid artery disease, unspecified laterality, unspecified type ([place]) -History of left CEA in [place].  Right ICA is occluded. -Continue lipid-lowering agents.  4. Paroxysmal atrial fibrillation ([facility]) -History of paroxysmal atrial fibrillation.  Was on Coumadin when we saw her.  We switch her to Eliquis.  There has been issues with anemia.  No overt signs of bleeding.  Was instructed that it is okay to take her Eliquis.  She will undergo colonoscopy and EGD.  I am okay for her to proceed with this. -Recently had lab work done by her nephrologist.  We will obtain those results.  We need to make sure her hemoglobin is stable.  If it continues to decline she should stop Eliquis.  No lightheadedness or dizziness reported. -Maintaining sinus rhythm on amiodarone 200 mg daily.  She will need yearly chest x-ray and yearly eye exam.  We will plan to reduce her to 100 mg daily when I see her back.  Her kidney dysfunction (CKD stage IV) does preclude her from other agents.  We could consider dronedarone but cost could be an issue.  5. Primary hypertension -BP slightly elevated today.  She reports this happens when she is in the office.  BP well controlled at home.  She will continue Coreg 37.5 twice daily.  Imdur 20 mg daily.  Losartan 100 mg daily.  HCTZ 25 mg daily.  6. S/P TAVR (transcatheter aortic valve replacement) -26 mm evolute pro.   Most recent echo shows stable gradients.  7. S/P mitral valve repair -26 mm annuloplasty ring.  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  She is status post repair.  Gradients are slightly elevated but expected.  This is a 26 mm ring.  No evidence of overt mitral stenosis on my review.  8.  Preoperative assessment -She is stable and without symptoms.  Okay to proceed with colonoscopy and EGD.  Disposition: Return in about 6 months (around 04/30/2021).  Medication Adjustments/Labs and Tests Ordered: Current medicines are reviewed at length with the patient today.  Concerns regarding medicines are outlined above.  No orders of the defined types were placed in this encounter.  No orders of the defined types were placed in this encounter.   Patient Instructions   Follow-Up: At [facility], you and your health needs are our priority.  As part of our continuing mission to provide you with exceptional heart care, we have created designated Provider Care Teams.  These Care Teams include your primary Cardiologist (physician) and Advanced Practice Providers (APPs -  Physician Assistants and Nurse Practitioners) who all work together to provide you with the care you need, when you need it.  We recommend signing up for the patient portal called "MyChart".  Sign up information is provided on this After Visit Summary.  MyChart is used to connect with patients for Virtual Visits (Telemedicine).  Patients are able to view lab/test results, encounter notes, upcoming appointments, etc.  Non-urgent messages can be sent to your provider as well.   To learn more about what you can do with MyChart, go to [email or web address].    Your next appointment:   6 month(s)  The format for your next appointment:   In Person  Provider:   Eleonore Chiquito, MD    Time Spent with Patient: I have spent a total of 35 minutes with patient reviewing hospital notes, telemetry, EKGs, labs and examining the  patient as well as establishing an assessment and plan that was discussed with the patient.  > 50% of time was spent in direct patient care.  Signed, Addison Naegeli. Audie Box, MD, [facility]  [address], [place], [place] [address] [phone]  10/28/2020 10:34 AM

## 2020-10-28 ENCOUNTER — Telehealth: Payer: Self-pay | Admitting: Cardiovascular Disease

## 2020-10-28 ENCOUNTER — Ambulatory Visit: Payer: Medicare HMO | Admitting: Cardiovascular Disease

## 2020-10-28 ENCOUNTER — Other Ambulatory Visit: Payer: Self-pay

## 2020-10-28 ENCOUNTER — Encounter: Payer: Self-pay | Admitting: Cardiovascular Disease

## 2020-10-28 VITALS — BP 166/90 | HR 73 | Ht 66.0 in | Wt 240.8 lb

## 2020-10-28 DIAGNOSIS — E782 Mixed hyperlipidemia: Secondary | ICD-10-CM | POA: Diagnosis not present

## 2020-10-28 DIAGNOSIS — Z952 Presence of prosthetic heart valve: Secondary | ICD-10-CM

## 2020-10-28 DIAGNOSIS — I2510 Atherosclerotic heart disease of native coronary artery without angina pectoris: Secondary | ICD-10-CM | POA: Diagnosis not present

## 2020-10-28 DIAGNOSIS — Z9889 Other specified postprocedural states: Secondary | ICD-10-CM

## 2020-10-28 DIAGNOSIS — I779 Disorder of arteries and arterioles, unspecified: Secondary | ICD-10-CM | POA: Diagnosis not present

## 2020-10-28 DIAGNOSIS — I480 Paroxysmal atrial fibrillation: Secondary | ICD-10-CM

## 2020-10-28 DIAGNOSIS — I10 Essential (primary) hypertension: Secondary | ICD-10-CM

## 2020-10-28 NOTE — Telephone Encounter (Signed)
Attempted to reach the patient back but both numbers provided were unavailable (could not be completed)

## 2020-10-28 NOTE — Telephone Encounter (Signed)
Pt is calling to give the name of the doctor.the doctor name is doctor Jew [phone]

## 2020-10-28 NOTE — Patient Instructions (Signed)
  Follow-Up: At [facility], you and your health needs are our priority.  As part of our continuing mission to provide you with exceptional heart care, we have created designated Provider Care Teams.  These Care Teams include your primary Cardiologist (physician) and Advanced Practice Providers (APPs -  Physician Assistants and Nurse Practitioners) who all work together to provide you with the care you need, when you need it.  We recommend signing up for the patient portal called "MyChart".  Sign up information is provided on this After Visit Summary.  MyChart is used to connect with patients for Virtual Visits (Telemedicine).  Patients are able to view lab/test results, encounter notes, upcoming appointments, etc.  Non-urgent messages can be sent to your provider as well.   To learn more about what you can do with MyChart, go to [email or web address].    Your next appointment:   6 month(s)  The format for your next appointment:   In Person  Provider:   Eleonore Chiquito, MD

## 2020-11-30 ENCOUNTER — Telehealth: Payer: Self-pay | Admitting: Cardiovascular Disease

## 2020-11-30 NOTE — Telephone Encounter (Signed)
Returned the call to the patient. She stated that the price keeps going up on the Eliquis.   She stated that she is very upset with the "system" and they should have told her the medication price was going to go up. She does not want to take the eliquis anymore but has been educated on why should not stop. She refuses to try for financial assistance.   Medication Samples have been provided to the patient to help until she gets out of the donut hole.  Drug name: Eliquis       Strength: 5 mg        Qty: 3 boxes  LOT: [id number]  Exp.Date: 8/204

## 2020-11-30 NOTE — Telephone Encounter (Signed)
Pt c/o medication issue:  1. Name of Medication: Eliquis  2. How are you currently taking this medication (dosage and times per day)? 2 rimes a day  3. Are you having a reaction (difficulty breathing--STAT)?   4. What is your medication issue? Patient can not afford the Eliquis, would like for Dr Audie Box to put her on something else

## 2020-11-30 NOTE — Telephone Encounter (Signed)
ELIQUIS 5MG  SAMPLE APPROVED. It may be worth printing a pt assistance form as well and including that with it. Routing back to nl triage.  Prescription SAMPLE request for Eliquis received. Indication:AFIB Last office visit:[name] 10/28/20 Scr:1.76 06/28/20 Age: 42F Weight:109.2KG

## 2020-12-14 ENCOUNTER — Telehealth: Payer: Self-pay

## 2020-12-14 NOTE — Telephone Encounter (Signed)
Covering preop today. Will route to pharm for input on anticoag then patient will need call. Last OV 10/2020.

## 2020-12-14 NOTE — Telephone Encounter (Signed)
   [facility] Pre-operative Risk Assessment    Patient Name: Destiny Rice  DOB: Aug 30, 1945 MRN: [id number]   Request for surgical clearance:  What type of surgery is being performed? LEFT TOTAL HIP ARTHROPLASTY ANTERIOR APPROACH  When is this surgery scheduled? TBD  What type of clearance is required (medical clearance vs. Pharmacy clearance to hold med vs. Both)? BOTH  Are there any medications that need to be held prior to surgery and how long? NONE LISTED-PT TAKES ELIQUIS  Practice name and name of physician performing surgery? [facility]  DR Rodman Key Alvan Dame  ATTN:  SHERRY   What is the office phone number? [phone]   7.   What is the office fax number? [phone]  8.   Anesthesia type (None, local, MAC, general) ? SPINAL

## 2020-12-15 NOTE — Telephone Encounter (Signed)
    Patient Name: Destiny Rice  DOB: 06-25-1945 MRN: [id number]  Primary Cardiologist: Evalina Field, MD  Chart reviewed as part of pre-operative protocol coverage.  The patient was last seen by Dr. Audie Box October 28, 2020.  She was doing well on cardiac standpoint and cleared for EGD in endoscopy at that time.   Given past medical history and time since last visit, based on ACC/AHA guidelines, Destiny Rice would be at acceptable risk for the planned procedure without further cardiovascular testing.   I will route this recommendation to the requesting party via Epic fax function and remove from pre-op pool.  Please call with questions.  [place], [place] 12/15/2020, 10:51 AM

## 2020-12-15 NOTE — Telephone Encounter (Signed)
Patient with diagnosis of afib on Eliquis for anticoagulation.    Procedure: left THA Date of procedure: 01/24/21  CHA2DS2-VASc Score = 6  This indicates a 9.7% annual risk of stroke. The patient's score is based upon: CHF History: 0 HTN History: 1 Diabetes History: 1 Stroke History: 0 Vascular Disease History: 1 Age Score: 2 Gender Score: 1  CrCl 52mL/min using adjusted body weight due to obesity Platelet count 304K  Per office protocol, patient can hold Eliquis for 3 days prior to procedure.

## 2021-01-05 ENCOUNTER — Telehealth: Payer: Self-pay | Admitting: *Deleted

## 2021-01-05 NOTE — Telephone Encounter (Signed)
   [facility] Pre-operative Risk Assessment    Patient Name: Destiny Rice  DOB: 10/31/1945 MRN: [id number]    Request for surgical clearance:  What type of surgery is being performed?  Left total hip arthroplasty  When is this surgery scheduled? tbd  What type of clearance is required (medical clearance vs. Pharmacy clearance to hold med vs. Both)? both  Are there any medications that need to be held prior to surgery and how long? Eliquis    Practice name and name of physician performing surgery?  [facility]; Dr Paralee Cancel  What is the office phone number?  [phone]   7.   What is the office fax number? Attn  [phone] attn Destiny Rice  8.   Anesthesia type (None, local, MAC, general) ?  spinal   Destiny Rice 01/05/2021, 1:39 PM  _________________________________________________________________   (provider comments below)

## 2021-01-05 NOTE — Telephone Encounter (Signed)
Patient with diagnosis of atrial fibrillation on Eliquis for anticoagulation.    Procedure: left total hip arthroplasty Date of procedure: TBD   CHA2DS2-VASc Score = 6   This indicates a 9.7% annual risk of stroke. The patient's score is based upon: CHF History: 0 HTN History: 1 Diabetes History: 1 Stroke History: 0 Vascular Disease History: 1 Age Score: 2 Gender Score: 1   CrCl 34 (with adjusted body weight) Platelet count 304  Per office protocol, patient can hold Eliquis for 3 days prior to procedure.   Patient will not need bridging with Lovenox (enoxaparin) around procedure.  For orthopedic procedures please be sure to resume therapeutic (not prophylactic) dosing.

## 2021-01-06 NOTE — Telephone Encounter (Signed)
   Primary Cardiologist: Evalina Field, MD  Chart reviewed as part of pre-operative protocol coverage. Given past medical history and time since last visit, based on ACC/AHA guidelines, Destiny Rice would be at acceptable risk for the planned procedure without further cardiovascular testing.   Patient with diagnosis of atrial fibrillation on Eliquis for anticoagulation.     Procedure: left total hip arthroplasty Date of procedure: TBD     CHA2DS2-VASc Score = 6   This indicates a 9.7% annual risk of stroke. The patient's score is based upon: CHF History: 0 HTN History: 1 Diabetes History: 1 Stroke History: 0 Vascular Disease History: 1 Age Score: 2 Gender Score: 1     CrCl 34 (with adjusted body weight) Platelet count 304   Per office protocol, patient can hold Eliquis for 3 days prior to procedure.   Patient will not need bridging with Lovenox (enoxaparin) around procedure.  For orthopedic procedures please be sure to resume therapeutic (not prophylactic) dosing.  I will route this recommendation to the requesting party via Epic fax function and remove from pre-op pool.  Please call with questions.  [name] NP-C    01/06/2021, 7:14 AM [address] Office [phone] Fax [phone]

## 2021-01-10 NOTE — Patient Instructions (Addendum)
DUE TO COVID-19 ONLY ONE VISITOR IS ALLOWED TO COME WITH YOU AND STAY IN THE WAITING ROOM ONLY DURING PRE OP AND PROCEDURE DAY OF SURGERY IF YOU ARE GOING HOME AFTER SURGERY. IF YOU ARE SPENDING THE NIGHT 2 PEOPLE MAY VISIT WITH YOU IN YOUR PRIVATE ROOM AFTER SURGERY UNTIL VISITING  HOURS ARE OVER AT 8:00 PM AND 1 VISITOR CAN SPEND THE NIGHT.   YOU NEED TO HAVE A COVID 19 TEST ON__10/28__THIS TEST MUST BE DONE BEFORE SURGERY,  COVID TESTING SITE  IS LOCATED AT [place], [place]. REMAIN IN YOUR CAR THIS IS A DRIVE UP TEST. AFTER YOUR COVID TEST PLEASE WEAR A MASK OUT IN PUBLIC AND SOCIAL DISTANCE AND [place] YOUR HANDS FREQUENTLY, ALSO ASK ALL YOUR CLOSE CONTACT PERSONS TO WEAR A MASK AND SOCIAL DISTANCE AND [place] THEIR HANDS FREQUENTLY ALSO.               KERI VEALE     Your procedure is scheduled on: 01/24/21   Report to [facility] Main  Entrance   Report to admitting at 8:45 AM     Call this number if you have problems the morning of surgery [phone]  . No food after midnight.    You may have clear liquid until 8:30 AM.    At 8:00 AM drink pre surgery drink. G2 located in the bag.   Nothing by mouth after 8:30 AM.    CLEAR LIQUID DIET   Foods Allowed                                                                     Foods Excluded                                                                                    liquids that you cannot  Plain Jell-O any favor except red or purple                                           see through such as: Fruit ices (not with fruit pulp)                                     milk, soups, orange juice  Iced Popsicles                                    All solid food Carbonated beverages, regular and diet                                    Cranberry, grape  and apple juices Sports drinks like Gatorade Lightly seasoned clear broth or consume(fat free) Sugar    BRUSH YOUR TEETH MORNING OF SURGERY AND RINSE YOUR  MOUTH OUT, NO CHEWING GUM CANDY OR MINTS.     Take these medicines the morning of surgery with A SIP OF WATER: Isosorbide, Amiodarone, Carvedilol, Synthroid, Pantoprazole    Stop taking _Eliquis__________on _10/28_________as instructed by __Dr. [name]___________.             How to Manage Your Diabetes Before and After Surgery  Why is it important to control my blood sugar before and after surgery? Improving blood sugar levels before and after surgery helps healing and can limit problems. A way of improving blood sugar control is eating a healthy diet by:  Eating less sugar and carbohydrates  Increasing activity/exercise  Talking with your doctor about reaching your blood sugar goals High blood sugars (greater than 180 mg/dL) can raise your risk of infections and slow your recovery, so you will need to focus on controlling your diabetes during the weeks before surgery. Make sure that the doctor who takes care of your diabetes knows about your planned surgery including the date and location.  How do I manage my blood sugar before surgery? Check your blood sugar at least 4 times a day, starting 2 days before surgery, to make sure that the level is not too high or low. Check your blood sugar the morning of your surgery when you wake up and every 2 hours until you get to the Short Stay unit. If your blood sugar is less than 70 mg/dL, you will need to treat for low blood sugar: Do not take insulin. Treat a low blood sugar (less than 70 mg/dL) with  cup of clear juice (cranberry or apple), 4 glucose tablets, OR glucose gel. Recheck blood sugar in 15 minutes after treatment (to make sure it is greater than 70 mg/dL). If your blood sugar is not greater than 70 mg/dL on recheck, call [phone] for further instructions. Report your blood sugar to the short stay nurse when you get to Short Stay.  If you are admitted to the hospital after surgery: Your blood sugar will be checked by the staff  and you will probably be given insulin after surgery (instead of oral diabetes medicines) to make sure you have good blood sugar levels. The goal for blood sugar control after surgery is 80-180 mg/dL.   WHAT DO I DO ABOUT MY DIABETES MEDICATION?  Do not take oral diabetes medicines (pills) the morning of surgery.                       You may not have any metal on your body including hair pins and              piercings  Do not wear jewelry, make-up, lotions, powders or perfumes, deodorant             Do not wear nail polish on your fingernails.  Do not shave  48 hours prior to surgery.                 Do not bring valuables to the hospital. [place].  Contacts, dentures or bridgework may not be worn into surgery.  Leave suitcase in the car. After surgery it may be brought to your room.  Please read over the following fact sheets you were given: _____________________________________________________________________                                                                                                        [place] - Preparing for Surgery Before surgery, you can play an important role.  Because skin is not sterile, your skin needs to be as free of germs as possible.  You can reduce the number of germs on your skin by washing with CHG (chlorahexidine gluconate) soap before surgery.  CHG is an antiseptic cleaner which kills germs and bonds with the skin to continue killing germs even after washing. Please DO NOT use if you have an allergy to CHG or antibacterial soaps.  If your skin becomes reddened/irritated stop using the CHG and inform your nurse when you arrive at Short Stay. Do not shave (including legs and underarms) for at least 48 hours prior to the first CHG shower.   Please follow these instructions carefully:  1.  Shower with CHG Soap the night before surgery and the   morning of Surgery.  2.  If you choose to wash your hair, wash your hair first as usual with your  normal  shampoo.  3.  After you shampoo, rinse your hair and body thoroughly to remove the  shampoo.                            4.  Use CHG as you would any other liquid soap.  You can apply chg directly  to the skin and wash                       Gently with a scrungie or clean washcloth.  5.  Apply the CHG Soap to your body ONLY FROM THE NECK DOWN.   Do not use on face/ open                           Wound or open sores. Avoid contact with eyes, ears mouth and genitals (private parts).                       Wash face,  Genitals (private parts) with your normal soap.             6.  Wash thoroughly, paying special attention to the area where your surgery  will be performed.  7.  Thoroughly rinse your body with warm water from the neck down.  8.  DO NOT shower/wash with your normal soap after using and rinsing off  the CHG Soap.                9.  Pat yourself dry with a clean towel.            10.  Wear clean pajamas.  11.  Place clean sheets on your bed the night of your first shower and do not  sleep with pets. Day of Surgery : Do not apply any lotions/deodorants the morning of surgery.  Please wear clean clothes to the hospital/surgery center.  FAILURE TO FOLLOW THESE INSTRUCTIONS MAY RESULT IN THE CANCELLATION OF YOUR SURGERY PATIENT SIGNATURE_________________________________  NURSE SIGNATURE__________________________________  ________________________________________________________________________   Adam Phenix  An incentive spirometer is a tool that can help keep your lungs clear and active. This tool measures how well you are filling your lungs with each breath. Taking long deep breaths may help reverse or decrease the chance of developing breathing (pulmonary) problems (especially infection) following: A long period of time when you are unable to move or be  active. BEFORE THE PROCEDURE  If the spirometer includes an indicator to show your best effort, your nurse or respiratory therapist will set it to a desired goal. If possible, sit up straight or lean slightly forward. Try not to slouch. Hold the incentive spirometer in an upright position. INSTRUCTIONS FOR USE  Sit on the edge of your bed if possible, or sit up as far as you can in bed or on a chair. Hold the incentive spirometer in an upright position. Breathe out normally. Place the mouthpiece in your mouth and seal your lips tightly around it. Breathe in slowly and as deeply as possible, raising the piston or the ball toward the top of the column. Hold your breath for 3-5 seconds or for as long as possible. Allow the piston or ball to fall to the bottom of the column. Remove the mouthpiece from your mouth and breathe out normally. Rest for a few seconds and repeat Steps 1 through 7 at least 10 times every 1-2 hours when you are awake. Take your time and take a few normal breaths between deep breaths. The spirometer may include an indicator to show your best effort. Use the indicator as a goal to work toward during each repetition. After each set of 10 deep breaths, practice coughing to be sure your lungs are clear. If you have an incision (the cut made at the time of surgery), support your incision when coughing by placing a pillow or rolled up towels firmly against it. Once you are able to get out of bed, walk around indoors and cough well. You may stop using the incentive spirometer when instructed by your caregiver.  RISKS AND COMPLICATIONS Take your time so you do not get dizzy or light-headed. If you are in pain, you may need to take or ask for pain medication before doing incentive spirometry. It is harder to take a deep breath if you are having pain. AFTER USE Rest and breathe slowly and easily. It can be helpful to keep track of a log of your progress. Your caregiver can provide you  with a simple table to help with this. If you are using the spirometer at home, follow these instructions: [place] IF:  You are having difficultly using the spirometer. You have trouble using the spirometer as often as instructed. Your pain medication is not giving enough relief while using the spirometer. You develop fever of 100.5 F (38.1 C) or higher. SEEK IMMEDIATE MEDICAL CARE IF:  You cough up bloody sputum that had not been present before. You develop fever of 102 F (38.9 C) or greater. You develop worsening pain at or near the incision site. MAKE SURE YOU:  Understand these instructions. Will watch your condition. Will  get help right away if you are not doing well or get worse. Document Released: 07/23/2006 Document Revised: 06/04/2011 Document Reviewed: 09/23/2006 [facility] Patient Information 2014 [place], [place].   ________________________________________________________________________

## 2021-01-11 ENCOUNTER — Other Ambulatory Visit: Payer: Self-pay

## 2021-01-11 ENCOUNTER — Encounter (HOSPITAL_COMMUNITY)
Admission: RE | Admit: 2021-01-11 | Discharge: 2021-01-11 | Disposition: A | Discharge status: Home / Self Care | Payer: Medicare HMO | Source: Ambulatory Visit | Attending: Orthopedic Surgery | Admitting: Orthopedic Surgery

## 2021-01-11 ENCOUNTER — Encounter (HOSPITAL_COMMUNITY): Payer: Self-pay | Admitting: *Deleted

## 2021-01-11 DIAGNOSIS — M1612 Unilateral primary osteoarthritis, left hip: Secondary | ICD-10-CM | POA: Insufficient documentation

## 2021-01-11 DIAGNOSIS — Z01812 Encounter for preprocedural laboratory examination: Secondary | ICD-10-CM | POA: Diagnosis present

## 2021-01-11 HISTORY — DX: Other specified postprocedural states: Z98.890

## 2021-01-11 HISTORY — DX: Nausea with vomiting, unspecified: R11.2

## 2021-01-11 NOTE — Progress Notes (Signed)
Pt missed her 1:00 PM PST visit. She was told by her PCP that she can't have surgery unless her Hgb is improved. Pt was confused and very upset so she didn't come to the PAT visit. I called her on the phone and talked with her and her husband to help clear things up.She will continue to take iron at home and labs will be drawn at [place] [facility] on 10/28 to see if her CBC is WNL. We will go over her pre op instructions, give her a G2 drink and soap. She needs to sign consent and a blood refusal form .Marland Kitchen She will get her Covid test on the 28 th as well.  I told her  that Dr. Aurea Graff office will communicate with her if surgery is cancelled on for 01/24/21. I left a message for [employer].   COVID test- 01/20/21   PCP - Dr. Suanne Marker at [facility] in [place] , [place] was with Dr. Hessie Diener on 01/10/21 Cardiologist - Dr. Viona Gilmore. [name]  Chest x-ray - no EKG - 04/21/20-epic Stress Test - no ECHO - 05/07/20-epic Cardiac Cath  with 10 stents Pacemaker/ICD device last checked:NA  Sleep Study - no CPAP -   Fasting Blood Sugar - 120-130. A1c 6.9 on 12/12/20 Checks Blood Sugar _QD____ times a day  Blood Thinner Instructions:Eliquis/ Dr. Audie Box Aspirin Instructions:Stop 3 days prior to DOS/ Dr. Audie Box Last Dose:10/28  Anesthesia review: yes  Patient denies shortness of breath, fever, cough and chest pain at PAT appointment Pt reports no SOB. She in using a walker at home and not doing very much. Her H&H was 9.9/30.7 on 10/25/20. She has been taking iron to bring it up so surgery can be approved.  Patient verbalized understanding of instructions that were given to them at the PAT appointment. Patient was also instructed that they will need to review over the PAT instructions again at home before surgery. Pt had difficulty listening and understanding the instructions over the phone. She was distracted by her husband ,  angry and confused. We will go over her instructions face to face on 10/ 28/22.

## 2021-01-20 ENCOUNTER — Encounter (HOSPITAL_COMMUNITY): Payer: Medicare HMO

## 2021-02-03 ENCOUNTER — Telehealth: Payer: Self-pay | Admitting: Cardiovascular Disease

## 2021-02-03 NOTE — Telephone Encounter (Signed)
Attempted to reach patient, line unable to connect. Will try again at another time.

## 2021-02-03 NOTE — Telephone Encounter (Signed)
Pt c/o medication issue:  1. Name of Medication: apixaban (ELIQUIS) 5 MG TABS tablet  2. How are you currently taking this medication (dosage and times per day)? Take 1 tablet (5 mg total) by mouth 2 (two) times daily.  3. Are you having a reaction (difficulty breathing--STAT)? no  4. What is your medication issue? Patient called in to see if dr Marisue Ivan, can put her on a medication because of the cost. Want to  take the generic kinds

## 2021-02-03 NOTE — Telephone Encounter (Signed)
Unable to connect to  phone

## 2021-02-07 NOTE — Telephone Encounter (Signed)
Number is not in service.

## 2021-02-10 ENCOUNTER — Encounter (HOSPITAL_COMMUNITY): Payer: Medicare HMO

## 2021-02-22 ENCOUNTER — Telehealth: Payer: Self-pay | Admitting: Cardiovascular Disease

## 2021-02-22 NOTE — Telephone Encounter (Signed)
Patient called in to the clinic reporting chest pressure/discomfort that she has felt on and off for the past few days. Her blood pressure yesterday was 157/51. She does not take her NTG sublingual because she is almost out of it. She cancelled her hip surgery because she wants to get another opinion. She did state she is still taking her Eliquis. She was not SOB as she spoke at length with me. While on the phone with her, I encouraged her to take a NTG, which she did. She said that "belching" is relieving her chest pressure. She did not want to stay on the phone with me any further. I did recommend that she go to the ER if her chest discomfort comes back and if her BP elevates. She understood, but said she did not want to go to the ER because they always change her medications. I let her know that I would send this message to Dr. Jenetta DownerNori Riis to find out his recommendations, and that someone would contact her with his response.

## 2021-02-22 NOTE — Telephone Encounter (Signed)
Pt c/o medication issue:   1. Name of Medication: apixaban (ELIQUIS) 5 MG TABS tablet   2. How are you currently taking this medication (dosage and times per day)? Take 1 tablet (5 mg total) by mouth 2 (two) times daily.   3. Are you having a reaction (difficulty breathing--STAT)? no   4. What is your medication issue? Patient called in to see if dr Marisue Ivan, can put her on a medication because of the cost. Want to  take the generic kinds . Pt c/o of Chest Pain: STAT if CP now or developed within 24 hours  1. Are you having CP right now? yes  2. Are you experiencing any other symptoms (ex. SOB, nausea, vomiting, sweating)? sob  3. How long have you been experiencing CP? Since yesterday  4. Is your CP continuous or coming and going? Comes and goes   5. Have you taken Nitroglycerin? no ?

## 2021-02-23 ENCOUNTER — Ambulatory Visit (HOSPITAL_COMMUNITY): Admission: RE | Admit: 2021-02-23 | Payer: Medicare HMO | Source: Home / Self Care | Admitting: Orthopedic Surgery

## 2021-02-23 ENCOUNTER — Telehealth: Payer: Self-pay | Admitting: Cardiovascular Disease

## 2021-02-23 ENCOUNTER — Other Ambulatory Visit: Payer: Self-pay

## 2021-02-23 ENCOUNTER — Encounter (HOSPITAL_COMMUNITY): Admission: RE | Payer: Self-pay | Source: Home / Self Care

## 2021-02-23 SURGERY — ARTHROPLASTY, HIP, TOTAL, ANTERIOR APPROACH
Anesthesia: Spinal | Site: Hip | Laterality: Left

## 2021-02-23 MED ORDER — PANTOPRAZOLE SODIUM 40 MG PO TBEC: 40.0000 mg | DELAYED_RELEASE_TABLET | Freq: Every day | ORAL | 11 refills | Status: DC

## 2021-02-23 NOTE — Telephone Encounter (Signed)
She's in the coverage gap, her price will go back down after January 1.  Xarelto would be the same price unfortunately.  We've heard rumors that Pradaxa is going to be available "at any time".  Will keep you posted if that may be an option.  Best is to just get her a few weeks of samples to get her to Jan 2.

## 2021-02-23 NOTE — Telephone Encounter (Signed)
Called patient advised of MD response.  RX sent to pharmacy.

## 2021-02-23 NOTE — Telephone Encounter (Signed)
Patient called and mentioned that her Eliquis has gone up to $140 and she cannot afford it.  She says that she really needs her medicine and wants to know what to do. Please call patient

## 2021-02-23 NOTE — Telephone Encounter (Signed)
Called patient, she states that her Eliquis has now went up to $140- she is not able to continue to afford this medication.  She is aware of her upcoming appointment next week, she will wait and discuss further with Dr.[name] at that visit- she does have enough to get her to this visit, I did offer samples now, but she will wait until next week when she is seen.  She is aware to try the Protonix 40 mg daily, this was sent to her pharmacy.  Patient thankful for call back. Just sending to MD as Juluis Rainier for next week, thanks!

## 2021-03-01 NOTE — Progress Notes (Signed)
Cardiology Office Note:   Date:  03/02/2021  NAME:  Destiny Rice    MRN: [id number] DOB:  October 21, 1945   PCP:  Sherrine Maples, MD  Cardiologist:  Evalina Field, MD  Electrophysiologist:  None   Referring MD: Rolly Salter*   Chief Complaint  Patient presents with   Follow-up    History of Present Illness:   Destiny Rice is a 75 y.o. female with a hx of CAD s/p PCI, MV repair, severe AS s/p TAVR, carrotid artery disease, DM, HTN, HLD, CKD IV who presents for follow-up. Has had chest pain recently.  She presents for follow-up.  She had some chest pain episodes.  Described as pressure in her chest.  It was alleviated by burping as well as belching.  She also describes gas.  She has been taking Gas-X medications with improvement in symptoms.  Symptoms appear to occur at night.  She has been walking around her house.  She can do of the 10 laps around the house.  She does get short of breath but I encouraged her to continue this.  Her blood pressure is 160/84.  She is taking medications.  She informed me that her blood pressure is normal at home.  She apparently has an element of having high blood pressure when seeing the doctor.  She again denies any symptoms concerning for angina.  She overall seems to be doing well.  She presents with her husband.  We discussed reducing her amiodarone to 100 mg daily.  She is okay to do this.  Diabetes appears to be well controlled with an A1c of 6.9.  Most recent LDL cholesterol 82 on Lipitor.  I think this is acceptable given her age.  Her HDL cholesterol is 114.  Nitroglycerin did not help her chest pain.  I believe this points more to gas.  Problem List 1. CAD -prior PCI 2. Cardiac tumor 3. Mitral Valve Repair -26 mm Edwards annuloplasty ring -12/09/2012 4. Severe aortic stenosis s/p 26 mm Evolut Pro -04/24/2018 4. Paroxysmal Afib 5. Carotid Artery Disease s/p L CEA -R ICA 100% 6. HTN 7. DM -A1c 6.9 8. HLD -T chol 211, TG  86, HDL 114, LDL 82 9. CKD III/IV -Cr 1.85 -eGFR 28  Past Medical History: Past Medical History:  Diagnosis Date   AF (paroxysmal atrial fibrillation) ([place]) 03/10/2020   Chronic kidney disease, stage 3b ([place]) 03/10/2020   Coronary artery disease    Hypertension    Hypothyroidism 03/10/2020   Mixed hyperlipidemia due to type 2 diabetes mellitus ([place]) 03/10/2020   PONV (postoperative nausea and vomiting)    Type 2 diabetes mellitus with stage 3b chronic kidney disease, without long-term current use of insulin ([place]) 03/10/2020    Past Surgical History: Past Surgical History:  Procedure Laterality Date   CAROTID ENDARTERECTOMY  2021   CORONARY ANGIOPLASTY WITH STENT PLACEMENT     x10   MITRAL VALVE REPAIR  12/09/2012   TRANSCATHETER AORTIC VALVE REPLACEMENT, 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    Current Medications: Current Meds  Medication Sig   ALPRAZolam (XANAX) 0.25 MG tablet Take 0.25 mg by mouth at bedtime as needed for anxiety.   apixaban (ELIQUIS) 5 MG TABS tablet Take 1 tablet (5 mg total) by mouth 2 (two) times daily.   atorvastatin (LIPITOR) 20 MG tablet Take 20 mg by mouth daily.   Calcium Carb-Cholecalciferol (CALCIUM 600 + D PO) Take 1 tablet by mouth daily.   carvedilol (COREG) 25 MG tablet Take  25 mg by mouth 2 (two) times daily with a meal.   cholecalciferol (VITAMIN D3) 25 MCG (1000 UNIT) tablet Take 1,000 Units by mouth daily.   Cyanocobalamin (VITAMIN B-12) 5000 MCG TBDP Take 5,000 mcg by mouth daily.   famotidine (PEPCID) 40 MG tablet Take 40 mg by mouth every evening.   Ferrous Sulfate (IRON PO) Take 2.5 mLs by mouth in the morning and at bedtime. Liquid Iron   fluticasone (FLONASE) 50 MCG/ACT nasal spray Place 1 spray into both nostrils daily as needed for allergies or rhinitis.   folic acid (FOLVITE) 161 MCG tablet Take 400 mcg by mouth daily.   KLOR-CON M20 20 MEQ tablet Take 20 mEq by mouth daily.   levothyroxine (SYNTHROID) 25 MCG tablet Take 25 mcg by mouth  daily before breakfast.   meclizine (ANTIVERT) 12.5 MG tablet Take 12.5 mg by mouth 3 (three) times daily as needed for dizziness.   nitroGLYCERIN (NITROSTAT) 0.4 MG SL tablet Place 0.4 mg under the tongue every 5 (five) minutes as needed for chest pain.   pantoprazole (PROTONIX) 40 MG tablet Take 1 tablet (40 mg total) by mouth daily.   [DISCONTINUED] amiodarone (PACERONE) 200 MG tablet Take 200 mg by mouth daily.     Allergies:    Iodine-131 and Sulfa antibiotics   Social History: Social History   Socioeconomic History   Marital status: Married    Spouse name: Not on file   Number of children: 1   Years of education: Not on file   Highest education level: Not on file  Occupational History   Occupation: retired  Tobacco Use   Smoking status: Never   Smokeless tobacco: Never  Substance and Sexual Activity   Alcohol use: Never   Drug use: Never   Sexual activity: Not on file  Other Topics Concern   Not on file  Social History Narrative   Not on file   Social Determinants of Health   Financial Resource Strain: Not on file  Food Insecurity: Not on file  Transportation Needs: Not on file  Physical Activity: Not on file  Stress: Not on file  Social Connections: Not on file     Family History: The patient's family history includes Stroke in her maternal grandmother.  ROS:   All other ROS reviewed and negative. Pertinent positives noted in the HPI.     EKGs/Labs/Other Studies Reviewed:   The following studies were personally reviewed by me today:   TTE 05/06/2020  1. Left ventricular ejection fraction, by estimation, is 55 to 60%. Left  ventricular ejection fraction by 3D volume is 58 %. The left ventricle has  normal function. The left ventricle has no regional wall motion  abnormalities. There is mild concentric  left ventricular hypertrophy. Left ventricular diastolic function could  not be evaluated. The average left ventricular global longitudinal strain  is  -16.0 %. The global longitudinal strain is normal.   2. Right ventricular systolic function is normal. The right ventricular  size is mildly enlarged.   3. Left atrial size was mildly dilated.   4. Right atrial size was mildly dilated.   5. S/P mitral valve annuloplasty ring. The mitral valve has been  repaired/replaced. Trivial mitral valve regurgitation. Moderate mitral  stenosis. The mean mitral valve gradient is 8.0 mmHg. There is a  prosthetic annuloplasty ring present in the mitral  position.   6. The aortic valve has been repaired/replaced. Aortic valve  regurgitation is not visualized. No aortic stenosis is present.  There is a  26 mm CoreValve-Evolut Pro prosthetic (TAVR) valve present in the aortic  position. Aortic valve area, by VTI measures   1.51 cm. Aortic valve mean gradient measures 11.0 mmHg. Aortic valve  Vmax measures 2.12 m/s. Diminsionless index is 0.5. The AVA is likely  underestimated due to small LVOT.   7. The inferior vena cava is normal in size with greater than 50%  respiratory variability, suggesting right atrial pressure of 3 mmHg.   Recent Labs: 03/09/2020: ALT 16; Hemoglobin 10.0; Platelets 339 03/11/2020: BUN 22; Creatinine, Ser 1.67; Magnesium 1.9; Potassium 3.4; Sodium 138   Recent Lipid Panel No results found for: CHOL, TRIG, HDL, CHOLHDL, VLDL, LDLCALC, LDLDIRECT  Physical Exam:   VS:  BP (!) 160/84   Pulse 63   Ht $R'5\' 5"'IY$  (1.651 m)   Wt 252 lb 12.8 oz (114.7 kg)   SpO2 96%   BMI 42.07 kg/m    Wt Readings from Last 3 Encounters:  03/02/21 252 lb 12.8 oz (114.7 kg)  01/11/21 242 lb (109.8 kg)  10/28/20 240 lb 12.8 oz (109.2 kg)    General: Well nourished, well developed, in no acute distress Head: Atraumatic, normal size  Eyes: PEERLA, EOMI  Neck: Supple, no JVD Endocrine: No thryomegaly Cardiac: Normal S1, S2; RRR; no murmurs, rubs, or gallops Lungs: Clear to auscultation bilaterally, no wheezing, rhonchi or rales  Abd: Soft,  nontender, no hepatomegaly  Ext: No edema, pulses 2+ Musculoskeletal: No deformities, BUE and BLE strength normal and equal Skin: Warm and dry, no rashes   Neuro: Alert and oriented to person, place, time, and situation, CNII-XII grossly intact, no focal deficits  Psych: Normal mood and affect   ASSESSMENT:   Destiny Rice is a 75 y.o. female who presents for the following: 1. Coronary artery disease involving native coronary artery of native heart without angina pectoris   2. Mixed hyperlipidemia   3. Carotid artery disease, unspecified laterality, unspecified type ([place])   4. Paroxysmal atrial fibrillation ([place])   5. Primary hypertension   6. S/P TAVR (transcatheter aortic valve replacement)   7. S/P mitral valve repair     PLAN:   1. Coronary artery disease involving native coronary artery of native heart without angina pectoris 2. Mixed hyperlipidemia -hx of PCI in the past.  -Not on aspirin due to Eliquis.  Continue Lipitor 20 mg daily.  Most recent LDL is close enough to goal. -Denies any chest pain symptoms.  Most recent echo shows normal LV function. -Her symptoms of chest pain appear to be related to acid reflux as well as gas.  I recommended continue Protonix as well as Gas-X medications.  3. Carotid artery disease, unspecified laterality, unspecified type ([place]) -Status post left CEA.  Right ICA is occluded.  We will continue with statin lowering agents.  She will need yearly carotid ultrasounds.  4. Paroxysmal atrial fibrillation ([facility]) -History of paroxysmal atrial fibrillation on amiodarone.  No recurrence of arrhythmia.  We will reduce her amiodarone to 100 mg daily.  I would like to avoid any long-term consequences of being on this. -Given her history of CAD as well as CKD stage IV she is not a candidate for other agents. -She will need yearly chest x-ray as well as eye exam.  We will also check thyroid studies yearly.  5. Primary hypertension -Elevated today.  She  reports it is well controlled at home.  Suspect there is an element of whitecoat hypertension.  Would recommend to continue  Coreg 25 mg twice daily, Imdur 20 mg daily.  We could add hydralazine if needed.  6. S/P TAVR (transcatheter aortic valve replacement) -No aspirin as she is on Eliquis. -Most recent echo shows normal TAVR function.  We will follow this periodically.  Most recent echo is stable. -She understands she needs SBE prophylaxis.  7. S/P mitral valve repair -Stable.  Disposition: Return in about 6 months (around 08/31/2021).  Medication Adjustments/Labs and Tests Ordered: Current medicines are reviewed at length with the patient today.  Concerns regarding medicines are outlined above.  No orders of the defined types were placed in this encounter.  Meds ordered this encounter  Medications   amiodarone (PACERONE) 100 MG tablet    Sig: Take 1 tablet (100 mg total) by mouth daily.    Dispense:  180 tablet    Refill:  1    Patient Instructions  Medication Instructions:  Decrease Amiodarone to 100 mg daily   *If you need a refill on your cardiac medications before your next appointment, please call your pharmacy*   Follow-Up: At [facility], you and your health needs are our priority.  As part of our continuing mission to provide you with exceptional heart care, we have created designated Provider Care Teams.  These Care Teams include your primary Cardiologist (physician) and Advanced Practice Providers (APPs -  Physician Assistants and Nurse Practitioners) who all work together to provide you with the care you need, when you need it.  We recommend signing up for the patient portal called "MyChart".  Sign up information is provided on this After Visit Summary.  MyChart is used to connect with patients for Virtual Visits (Telemedicine).  Patients are able to view lab/test results, encounter notes, upcoming appointments, etc.  Non-urgent messages can be sent to your provider  as well.   To learn more about what you can do with MyChart, go to [email or web address].    Your next appointment:   6 month(s)  The format for your next appointment:   In Person  Provider:   Evalina Field, MD       Time Spent with Patient: I have spent a total of 35 minutes with patient reviewing hospital notes, telemetry, EKGs, labs and examining the patient as well as establishing an assessment and plan that was discussed with the patient.  > 50% of time was spent in direct patient care.  Signed, Addison Naegeli. Audie Box, MD, [place]  [address], [place],  [address] [phone]  03/02/2021 1:54 PM

## 2021-03-02 ENCOUNTER — Encounter: Payer: Self-pay | Admitting: Cardiovascular Disease

## 2021-03-02 ENCOUNTER — Other Ambulatory Visit: Payer: Self-pay

## 2021-03-02 ENCOUNTER — Ambulatory Visit: Payer: Medicare HMO | Admitting: Cardiovascular Disease

## 2021-03-02 VITALS — BP 160/84 | HR 63 | Ht 65.0 in | Wt 252.8 lb

## 2021-03-02 DIAGNOSIS — E782 Mixed hyperlipidemia: Secondary | ICD-10-CM

## 2021-03-02 DIAGNOSIS — I2510 Atherosclerotic heart disease of native coronary artery without angina pectoris: Secondary | ICD-10-CM

## 2021-03-02 DIAGNOSIS — Z9889 Other specified postprocedural states: Secondary | ICD-10-CM

## 2021-03-02 DIAGNOSIS — I480 Paroxysmal atrial fibrillation: Secondary | ICD-10-CM | POA: Diagnosis not present

## 2021-03-02 DIAGNOSIS — I779 Disorder of arteries and arterioles, unspecified: Secondary | ICD-10-CM

## 2021-03-02 DIAGNOSIS — I10 Essential (primary) hypertension: Secondary | ICD-10-CM

## 2021-03-02 DIAGNOSIS — Z952 Presence of prosthetic heart valve: Secondary | ICD-10-CM

## 2021-03-02 MED ORDER — AMIODARONE HCL 100 MG PO TABS: 100.0000 mg | ORAL_TABLET | Freq: Every day | ORAL | 1 refills | Status: DC

## 2021-03-02 NOTE — Patient Instructions (Signed)
Medication Instructions:  Decrease Amiodarone to 100 mg daily   *If you need a refill on your cardiac medications before your next appointment, please call your pharmacy*   Follow-Up: At [facility], you and your health needs are our priority.  As part of our continuing mission to provide you with exceptional heart care, we have created designated Provider Care Teams.  These Care Teams include your primary Cardiologist (physician) and Advanced Practice Providers (APPs -  Physician Assistants and Nurse Practitioners) who all work together to provide you with the care you need, when you need it.  We recommend signing up for the patient portal called "MyChart".  Sign up information is provided on this After Visit Summary.  MyChart is used to connect with patients for Virtual Visits (Telemedicine).  Patients are able to view lab/test results, encounter notes, upcoming appointments, etc.  Non-urgent messages can be sent to your provider as well.   To learn more about what you can do with MyChart, go to [email or web address].    Your next appointment:   6 month(s)  The format for your next appointment:   In Person  Provider:   Evalina Field, MD

## 2021-03-30 ENCOUNTER — Telehealth: Payer: Self-pay | Admitting: Cardiovascular Disease

## 2021-03-30 NOTE — Telephone Encounter (Signed)
Called pt to let her know the name on her future bottle with be Dr. Audie Box. No answer at this time, unable to leave message on the machine. Received message "unable to complete your call at this time."

## 2021-03-30 NOTE — Telephone Encounter (Signed)
Called pt to get more information. She states her medication has Destiny Rice on the bottle and she does not see him. I updated her PCP as well. I will call [facility] to see what the issue is. Will call pt back with an update.

## 2021-03-30 NOTE — Telephone Encounter (Signed)
Pt c/o medication issue:  1. Name of Medication: apixaban (ELIQUIS) 5 MG TABS tablet  2. How are you currently taking this medication (dosage and times per day)? Take 1 tablet (5 mg total) by mouth 2 (two) times daily.  3. Are you having a reaction (difficulty breathing--STAT)? no  4. What is your medication issue? Patient calling in because it is a issue with getting the medication. The prescription supposed to have Dr Marisue Ivan name on it but it has another dr name

## 2021-03-30 NOTE — Telephone Encounter (Signed)
Spoke with Lyanne Co and he states the medication has been being filled under R[facility] Rolls since 6/31/2022. He will disable that prescription ans activate the prescription by Dr. Audie Box. The bottle the pt picked up yesterday will be the last one that has Dr. Arcola Jansky name.  Will call pt to give the update.

## 2021-03-31 DIAGNOSIS — D509 Iron deficiency anemia, unspecified: Secondary | ICD-10-CM | POA: Diagnosis not present

## 2021-03-31 DIAGNOSIS — N189 Chronic kidney disease, unspecified: Secondary | ICD-10-CM | POA: Diagnosis not present

## 2021-04-11 DIAGNOSIS — D509 Iron deficiency anemia, unspecified: Secondary | ICD-10-CM | POA: Diagnosis not present

## 2021-04-11 DIAGNOSIS — N189 Chronic kidney disease, unspecified: Secondary | ICD-10-CM | POA: Diagnosis not present

## 2021-04-20 DIAGNOSIS — J069 Acute upper respiratory infection, unspecified: Secondary | ICD-10-CM | POA: Diagnosis not present

## 2021-05-05 ENCOUNTER — Ambulatory Visit: Payer: Medicare HMO | Admitting: Cardiovascular Disease

## 2021-05-10 DIAGNOSIS — N184 Chronic kidney disease, stage 4 (severe): Secondary | ICD-10-CM | POA: Diagnosis not present

## 2021-05-12 DIAGNOSIS — L603 Nail dystrophy: Secondary | ICD-10-CM | POA: Diagnosis not present

## 2021-05-12 DIAGNOSIS — E1142 Type 2 diabetes mellitus with diabetic polyneuropathy: Secondary | ICD-10-CM | POA: Diagnosis not present

## 2021-05-12 DIAGNOSIS — E1151 Type 2 diabetes mellitus with diabetic peripheral angiopathy without gangrene: Secondary | ICD-10-CM | POA: Diagnosis not present

## 2021-05-12 DIAGNOSIS — I739 Peripheral vascular disease, unspecified: Secondary | ICD-10-CM | POA: Diagnosis not present

## 2021-05-25 DIAGNOSIS — E785 Hyperlipidemia, unspecified: Secondary | ICD-10-CM | POA: Diagnosis not present

## 2021-05-25 DIAGNOSIS — N2581 Secondary hyperparathyroidism of renal origin: Secondary | ICD-10-CM | POA: Diagnosis not present

## 2021-05-25 DIAGNOSIS — I129 Hypertensive chronic kidney disease with stage 1 through stage 4 chronic kidney disease, or unspecified chronic kidney disease: Secondary | ICD-10-CM | POA: Diagnosis not present

## 2021-05-25 DIAGNOSIS — D631 Anemia in chronic kidney disease: Secondary | ICD-10-CM | POA: Diagnosis not present

## 2021-05-25 DIAGNOSIS — N1832 Chronic kidney disease, stage 3b: Secondary | ICD-10-CM | POA: Diagnosis not present

## 2021-05-29 ENCOUNTER — Telehealth: Payer: Self-pay | Admitting: Cardiovascular Disease

## 2021-05-29 NOTE — Telephone Encounter (Signed)
Called [facility] to inquire about amiodarone based on message received  ?Patient had previously gotten amiodarone '100mg'$  tablet #90 for $0 in Dec 2022 ?She has new insurance for 2023 ?She got amiodarone '100mg'$  #30 for $70 yesterday and is not out of the medication  ? ?Patient states that she called [employer] (new insurance) that she will no longer be able to get amiodarone '100mg'$  tablets anymore - was able to get a 1 month supply. She states that her medication is "discontinued". She states she has never had to pay for her heart medications and asked what is she going to do. She repeated over that she never has to pay for her meds.  ? ?Patient's voice escalated in volume with frustration in not understanding that our office prescribes medications but does not know the cost to the patient and that depends on her insurance plan, where meds fall on tiers and we have no control over that. She stated she was not yelling and that she may need to find her a new doctor if we can't help her. Explained that we are trying to help, that I did my due diligence in calling pharmacy first to find out co-pay and if PA was needed, etc.  ? ?Advised will send a message to Dr. Audie Box and Almyra Free LPN to review and advise ?- Unsure if she is able to get amiodarone '200mg'$  tablets to cut in half ?- Unsure if correspondence from [employer] has been received about this(?) ? ?

## 2021-05-29 NOTE — Telephone Encounter (Signed)
Patient called stating her insurance company is not covering amiodarone (PACERONE) 100 MG tablet anymore. They only gave her a 30 day supply.  ?

## 2021-05-30 MED ORDER — AMIODARONE HCL 200 MG PO TABS: 100.0000 mg | ORAL_TABLET | Freq: Every day | ORAL | 3 refills | Status: DC

## 2021-05-30 NOTE — Telephone Encounter (Signed)
Patient aware of amiodarone tablet change from '100mg'$  to '200mg'$  (take 1/2 tablet daily) due to cost issues. Reiterated that she will need to cut new tablets in half. Advised that she should not have to pay for her future prescriptions.  ? ?Scheduled for 6 month visit with [name] MD on 08/22/21 ?

## 2021-05-30 NOTE — Telephone Encounter (Signed)
Destiny Rile, MD  Fidel Levy, RN ?Cc: Caprice Beaver, LPN ?Caller: Unspecified (Yesterday,  2:34 PM) ?Can we see if the 200 mg tablet is covered? Then she can cut in half.  ? ?[place] T. Audie Box, MD, [facility]  ?[place]  ?[address], [address]  ?[place], [place] [address]  ?[phone]  ?7:30 AM   ?  ?   ? ?[facility] and amiodarone '200mg'$  tablets - take 1/2 tablet ('100mg'$ ) by mouth daily is covered w/insurance for $0 copay per pharmacy tech. Provided verbal order for medication.  ?

## 2021-05-30 NOTE — Telephone Encounter (Signed)
Thank you :)

## 2021-06-19 DIAGNOSIS — M25552 Pain in left hip: Secondary | ICD-10-CM | POA: Diagnosis not present

## 2021-06-19 DIAGNOSIS — E782 Mixed hyperlipidemia: Secondary | ICD-10-CM | POA: Diagnosis not present

## 2021-06-19 DIAGNOSIS — E039 Hypothyroidism, unspecified: Secondary | ICD-10-CM | POA: Diagnosis not present

## 2021-06-19 DIAGNOSIS — I10 Essential (primary) hypertension: Secondary | ICD-10-CM | POA: Diagnosis not present

## 2021-06-19 DIAGNOSIS — N1832 Chronic kidney disease, stage 3b: Secondary | ICD-10-CM | POA: Diagnosis not present

## 2021-06-19 DIAGNOSIS — E1169 Type 2 diabetes mellitus with other specified complication: Secondary | ICD-10-CM | POA: Diagnosis not present

## 2021-06-27 ENCOUNTER — Ambulatory Visit: Payer: Medicare HMO

## 2021-06-27 ENCOUNTER — Telehealth: Payer: Self-pay | Admitting: Cardiovascular Disease

## 2021-06-27 NOTE — Telephone Encounter (Signed)
Called pt to ask her to come in for and EKG. She is able to come tomorrow. Set up with nurse visit at 2pm.  ?

## 2021-06-27 NOTE — Telephone Encounter (Signed)
Spoke with pt. She is upset that she was taken off of Amiodarone 200 mg. "I just don't understand why. Why would he do that? I have been on it for years with no problems. After he changed it to 100 I have been having problems. I've been SOB, chest pain, and have been tired. I was able to get my medication for free now I have to pay for it. I just don't get it. I know what y'all are trying to do. Just kill me. This doesn't make any sense. Why would he take me off of the 200?" Pt made aware her dose was reduced to 100 mg due to non-reassurance of the arrhythmia. "Well I started having problems after I started cutting that pill in half." Pt made aware I will get this message to Dr. Audie Box.  ?

## 2021-06-27 NOTE — Telephone Encounter (Signed)
Pt c/o medication issue: ? ?1. Name of Medication: amiodarone (PACERONE) 200 MG tablet ? ?2. How are you currently taking this medication (dosage and times per day)? Half a tablet daily ? ?3. Are you having a reaction (difficulty breathing--STAT)? no ? ?4. What is your medication issue? Patient states her medication was cut down to 100 mg tablets daily and was doing okay, but then they had to cut the 200 mg tablets in half. She says since then she "does not feel well". She states she " just does not feel well in her chest". ? ?Patient c/o Palpitations:  High priority if patient c/o lightheadedness, shortness of breath, or chest pain ? ?How long have you had palpitations/irregular HR/ Afib? Are you having the symptoms now? Has felt the symptoms since cutting her amiodarone in half last week ? ?Are you currently experiencing lightheadedness, SOB or CP? Lightheadedness, SOB sometimes, and a "feels bad in chest ? ?Do you have a history of afib (atrial fibrillation) or irregular heart rhythm? yes ? ?Have you checked your BP or HR? (document readings if available): no  ? ?Are you experiencing any other symptoms? No ? ? ?Patient states the feeling in her chest is the same as when her heart is out of rhythm. She says it is not a pain, pressure, or tightness. ?

## 2021-06-28 ENCOUNTER — Ambulatory Visit (INDEPENDENT_AMBULATORY_CARE_PROVIDER_SITE_OTHER): Payer: Medicare Other | Admitting: *Deleted

## 2021-06-28 DIAGNOSIS — Z79899 Other long term (current) drug therapy: Secondary | ICD-10-CM | POA: Diagnosis not present

## 2021-06-28 DIAGNOSIS — I480 Paroxysmal atrial fibrillation: Secondary | ICD-10-CM | POA: Diagnosis not present

## 2021-06-28 NOTE — Progress Notes (Signed)
? ?  Nurse Visit  ? ?Date of Encounter: 06/28/2021 ?ID: Destiny Rice, DOB 12/24/1945, MRN [id number] ? ?PCP:  Carol Ada, MD ?  ?[facility] ?Cardiologist:  Evalina Field, MD    ? ? ?Visit Details  ? ?VS:   ?HR 70 bpm per EKG - SR w/1st degree AVB ?Weight 248 lbs ? ? ?Wt Readings from Last 3 Encounters:  ?03/02/21 252 lb 12.8 oz (114.7 kg)  ?01/11/21 242 lb (109.8 kg)  ?10/28/20 240 lb 12.8 oz (109.2 kg)  ?  ? ?Reason for visit: EKG check d/t change in amiodarone dose - see phone note 06/27/21 ?Performed today:  ?Vitals  ?EKG  ?MD consult - Dr Audie Box reviewed EKG and discussed w/patient ?Education - patient on amiodarone, explained why dose was decreased at last visit, effects medication can have on thyroid, liver, lungs for example ?Changes (medications, testing, etc.) : increase amiodarone to '200mg'$  daily - patient does not need updated Rx sent to pharmacy ?Length of Visit: 30 minutes ? ? ? ?Medications Adjustments/Labs and Tests Ordered: ?Orders Placed This Encounter  ?Procedures  ? EKG 12-Lead  ? ?No orders of the defined types were placed in this encounter. ? ? ? ?Signed, ?Fidel Levy, RN  ?06/28/2021 3:39 PM ? ?

## 2021-06-28 NOTE — Patient Instructions (Signed)
Per Dr. Audie Box,  ?- EKG is sinus rhythm ?- INCREASE amiodarone to '200mg'$  daily ?

## 2021-07-06 DIAGNOSIS — I739 Peripheral vascular disease, unspecified: Secondary | ICD-10-CM | POA: Diagnosis not present

## 2021-07-06 DIAGNOSIS — E782 Mixed hyperlipidemia: Secondary | ICD-10-CM | POA: Diagnosis not present

## 2021-07-06 DIAGNOSIS — M247 Protrusio acetabuli: Secondary | ICD-10-CM | POA: Diagnosis not present

## 2021-07-06 DIAGNOSIS — M1712 Unilateral primary osteoarthritis, left knee: Secondary | ICD-10-CM | POA: Diagnosis not present

## 2021-07-06 DIAGNOSIS — M1612 Unilateral primary osteoarthritis, left hip: Secondary | ICD-10-CM | POA: Diagnosis not present

## 2021-07-06 DIAGNOSIS — E1169 Type 2 diabetes mellitus with other specified complication: Secondary | ICD-10-CM | POA: Diagnosis not present

## 2021-07-18 DIAGNOSIS — I4819 Other persistent atrial fibrillation: Secondary | ICD-10-CM | POA: Diagnosis not present

## 2021-07-18 DIAGNOSIS — E782 Mixed hyperlipidemia: Secondary | ICD-10-CM | POA: Diagnosis not present

## 2021-07-18 DIAGNOSIS — E1169 Type 2 diabetes mellitus with other specified complication: Secondary | ICD-10-CM | POA: Diagnosis not present

## 2021-07-18 DIAGNOSIS — I10 Essential (primary) hypertension: Secondary | ICD-10-CM | POA: Diagnosis not present

## 2021-07-18 DIAGNOSIS — N1832 Chronic kidney disease, stage 3b: Secondary | ICD-10-CM | POA: Diagnosis not present

## 2021-08-21 NOTE — Progress Notes (Unsigned)
Cardiology Office Note:   Date:  08/22/2021  NAME:  Destiny Rice    MRN: [id number] DOB:  [birth year]   PCP:  Carol Ada, MD  Cardiologist:  Evalina Field, MD  Electrophysiologist:  None   Referring MD: No ref. provider found   Chief Complaint  Patient presents with   Follow-up   History of Present Illness:   Destiny Rice is a 76 y.o. female with a hx of paroxysmal Afib, MV repair, TAVR, DM, HTN, CKD IV who presents for follow-up.  She presents with her husband.  Reports no symptoms.  Blood pressure has been elevated but now started on amlodipine by her primary care physician.  Seems to be controlling this better.  No recurrence of atrial fibrillation.  No palpitations.  Denies any chest pain or trouble breathing.  She is on amiodarone 200 mg daily.  We have tried to reduce the dose at the last visit.  She did not want to do this.  She apparently felt poorly.  She needs a chest x-ray, TSH and will see her eye doctor yearly.  She reports an upcoming visit to her eye doctor this summer.  Overall without major complaints.  Kidney function is stable.  Diabetes is stable as well.  She does need repeat carotid ultrasounds.  Has not had any done in the past 2 years.  Problem List 1. CAD -prior PCI 2. Cardiac tumor 3. Mitral Valve Repair -26 mm Edwards annuloplasty ring -12/09/2012 4. Severe aortic stenosis s/p 26 mm Evolut Pro -04/24/2018 4. Paroxysmal Afib 5. Carotid Artery Disease s/p L CEA -R ICA 100% 6. HTN 7. DM -A1c 6.9 8. HLD -T chol 215, HDL 104, LDL 92, triglycerides 112 9. CKD III/IV -Cr 1.85 -eGFR 28  Past Medical History: Past Medical History:  Diagnosis Date   AF (paroxysmal atrial fibrillation) ([place]) 03/10/2020   Chronic kidney disease, stage 3b ([place]) 03/10/2020   Coronary artery disease    Hypertension    Hypothyroidism 03/10/2020   Mixed hyperlipidemia due to type 2 diabetes mellitus ([place]) 03/10/2020   PONV (postoperative nausea and vomiting)    Type 2  diabetes mellitus with stage 3b chronic kidney disease, without long-term current use of insulin ([place]) 03/10/2020    Past Surgical History: Past Surgical History:  Procedure Laterality Date   CAROTID ENDARTERECTOMY  2021   CORONARY ANGIOPLASTY WITH STENT PLACEMENT     x10   MITRAL VALVE REPAIR  12/09/2012   TRANSCATHETER AORTIC VALVE REPLACEMENT, 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    Current Medications: Current Meds  Medication Sig   ALPRAZolam (XANAX) 0.25 MG tablet Take 0.25 mg by mouth at bedtime as needed for anxiety.   amiodarone (PACERONE) 200 MG tablet Take 200 mg by mouth daily.   amLODipine (NORVASC) 10 MG tablet Take 10 mg by mouth daily.   apixaban (ELIQUIS) 5 MG TABS tablet Take 1 tablet (5 mg total) by mouth 2 (two) times daily.   atorvastatin (LIPITOR) 20 MG tablet Take 20 mg by mouth daily.   Calcium Carb-Cholecalciferol (CALCIUM 600 + D PO) Take 1 tablet by mouth daily.   carvedilol (COREG) 25 MG tablet Take 25 mg by mouth 2 (two) times daily with a meal.   cholecalciferol (VITAMIN D3) 25 MCG (1000 UNIT) tablet Take 1,000 Units by mouth daily.   Cyanocobalamin (VITAMIN B-12) 5000 MCG TBDP Take 5,000 mcg by mouth daily.   famotidine (PEPCID) 40 MG tablet Take 40 mg by mouth every evening.   Ferrous Sulfate (  IRON PO) Take 2.5 mLs by mouth in the morning and at bedtime. Liquid Iron   fluticasone (FLONASE) 50 MCG/ACT nasal spray Place 1 spray into both nostrils daily as needed for allergies or rhinitis.   folic acid (FOLVITE) 400 MCG tablet Take 400 mcg by mouth daily.   KLOR-CON M20 20 MEQ tablet Take 20 mEq by mouth daily.   levothyroxine (SYNTHROID) 25 MCG tablet Take 25 mcg by mouth daily before breakfast.   meclizine (ANTIVERT) 12.5 MG tablet Take 12.5 mg by mouth 3 (three) times daily as needed for dizziness.   nitroGLYCERIN (NITROSTAT) 0.4 MG SL tablet Place 0.4 mg under the tongue every 5 (five) minutes as needed for chest pain.   pantoprazole (PROTONIX) 40 MG tablet Take  1 tablet (40 mg total) by mouth daily.   pantoprazole (PROTONIX) 40 MG tablet 1 tablet   traMADol (ULTRAM) 50 MG tablet SMARTSIG:1-2 Tablet(s) By Mouth 2-3 Times Daily PRN   valsartan-hydrochlorothiazide (DIOVAN-HCT) 160-25 MG tablet Take 1 tablet by mouth daily.     Allergies:    Iodine-131 and Sulfa antibiotics   Social History: Social History   Socioeconomic History   Marital status: Married    Spouse name: Not on file   Number of children: 1   Years of education: Not on file   Highest education level: Not on file  Occupational History   Occupation: retired  Tobacco Use   Smoking status: Never   Smokeless tobacco: Never  Substance and Sexual Activity   Alcohol use: Never   Drug use: Never   Sexual activity: Not on file  Other Topics Concern   Not on file  Social History Narrative   Not on file   Social Determinants of Health   Financial Resource Strain: Not on file  Food Insecurity: Not on file  Transportation Needs: Not on file  Physical Activity: Not on file  Stress: Not on file  Social Connections: Not on file     Family History: The patient's family history includes Stroke in her maternal grandmother.  ROS:   All other ROS reviewed and negative. Pertinent positives noted in the HPI.     EKGs/Labs/Other Studies Reviewed:   The following studies were personally reviewed by me today:  TTE 05/07/2020  1. Left ventricular ejection fraction, by estimation, is 55 to 60%. Left  ventricular ejection fraction by 3D volume is 58 %. The left ventricle has  normal function. The left ventricle has no regional wall motion  abnormalities. There is mild concentric  left ventricular hypertrophy. Left ventricular diastolic function could  not be evaluated. The average left ventricular global longitudinal strain  is -16.0 %. The global longitudinal strain is normal.   2. Right ventricular systolic function is normal. The right ventricular  size is mildly enlarged.   3.  Left atrial size was mildly dilated.   4. Right atrial size was mildly dilated.   5. S/P mitral valve annuloplasty ring. The mitral valve has been  repaired/replaced. Trivial mitral valve regurgitation. Moderate mitral  stenosis. The mean mitral valve gradient is 8.0 mmHg. There is a  prosthetic annuloplasty ring present in the mitral  position.   6. The aortic valve has been repaired/replaced. Aortic valve  regurgitation is not visualized. No aortic stenosis is present. There is a  26 mm CoreValve-Evolut Pro prosthetic (TAVR) valve present in the aortic  position. Aortic valve area, by VTI measures   1.51 cm. Aortic valve mean gradient measures 11.0 mmHg. Aortic valve  Vmax measures 2.12 m/s. Diminsionless index is 0.5. The AVA is likely  underestimated due to small LVOT.   7. The inferior vena cava is normal in size with greater than 50%  respiratory variability, suggesting right atrial pressure of 3 mmHg.   Recent Labs: No results found for requested labs within last 8760 hours.   Recent Lipid Panel No results found for: CHOL, TRIG, HDL, CHOLHDL, VLDL, LDLCALC, LDLDIRECT  Physical Exam:   VS:  BP 140/90   Pulse 65   Ht $R'5\' 5"'vq$  (1.651 m)   Wt 249 lb 6.4 oz (113.1 kg)   SpO2 95%   BMI 41.50 kg/m    Wt Readings from Last 3 Encounters:  08/22/21 249 lb 6.4 oz (113.1 kg)  03/02/21 252 lb 12.8 oz (114.7 kg)  01/11/21 242 lb (109.8 kg)    General: Well nourished, well developed, in no acute distress Head: Atraumatic, normal size  Eyes: PEERLA, EOMI  Neck: Supple, no JVD Endocrine: No thryomegaly Cardiac: Normal S1, S2; RRR; no murmurs, rubs, or gallops Lungs: Clear to auscultation bilaterally, no wheezing, rhonchi or rales  Abd: Soft, nontender, no hepatomegaly  Ext: No edema, pulses 2+ Musculoskeletal: No deformities, BUE and BLE strength normal and equal Skin: Warm and dry, no rashes   Neuro: Alert and oriented to person, place, time, and situation, CNII-XII grossly  intact, no focal deficits  Psych: Normal mood and affect   ASSESSMENT:   Destiny Rice is a 76 y.o. female who presents for the following: 1. Paroxysmal atrial fibrillation (HCC)   2. Acquired thrombophilia ([place])   3. On amiodarone therapy   4. Encounter for monitoring amiodarone therapy   5. Coronary artery disease involving native coronary artery of native heart without angina pectoris   6. Mixed hyperlipidemia   7. Carotid artery disease, unspecified laterality, unspecified type ([place])   8. Primary hypertension   9. S/P TAVR (transcatheter aortic valve replacement)   10. S/P mitral valve repair     PLAN:   1. Paroxysmal atrial fibrillation (HCC) 2. Acquired thrombophilia ([place]) 3. On amiodarone therapy 4. Encounter for monitoring amiodarone therapy -Paroxysmal atrial fibrillation.  Rhythm control with amiodarone.  Not wanting to try other medications.  Given CKD not a good option.  We will continue amiodarone 200 mg daily.  She does not want to reduce the dose.  Needs chest x-ray yearly, TSH.  She will see her eye doctor yearly as well.  5. Coronary artery disease involving native coronary artery of native heart without angina pectoris -History of prior PCI.  Not on aspirin due to Eliquis.  Continue Lipitor 20 mg daily.  Most recent LDL close to goal.  No symptoms of angina.  6. Mixed hyperlipidemia -Lipitor 20 mg daily.  7. Carotid artery disease, unspecified laterality, unspecified type ([place]) -Status post left CEA in the past.  Needs repeat carotids.  8. Primary hypertension -Well-controlled on Coreg 25 mg twice daily, valsartan, HCTZ, amlodipine 10 mg daily.  Also on Imdur.  9. S/P TAVR (transcatheter aortic valve replacement) -Recent valve is stable.  Needs antibiotic prophylaxis.  10. 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  Stable on echo.      Disposition: Return in about 6 months (around  02/22/2022).  Medication Adjustments/Labs and Tests Ordered: Current medicines are reviewed at length with the patient today.  Concerns regarding medicines are outlined above.  Orders Placed This Encounter  Procedures   DG Chest 2  View   TSH   VAS US CAROTID   No orders of the defined types were placed in this encounter.   Patient Instructions  Medication Instructions:  The current medical regimen is effective;  continue present plan and medications.  *If you need a refill on your cardiac medications before your next appointment, please call your pharmacy*   Lab Work: TSH today   If you have labs (blood work) drawn today and your tests are completely normal, you will receive your results only by: [place] (if you have MyChart) OR A paper copy in the mail If you have any lab test that is abnormal or we need to change your treatment, we will call you to review the results.   Testing/Procedures: Your physician has requested that you have a carotid duplex. This test is an ultrasound of the carotid arteries in your neck. It looks at blood flow through these arteries that supply the brain with blood. Allow one hour for this exam. There are no restrictions or special instructions.  Chest xray - Your physician has requested that you have a chest xray, is a fast and painless imaging test that uses certain electromagnetic waves to create pictures of the structures in and around your chest. This test can help diagnose and monitor conditions such as pneumonia and other lung issues his will be done at [facility], [place]. If you should need to call them their phone number is [phone].   Follow-Up: At [facility], you and your health needs are our priority.  As part of our continuing mission to provide you with exceptional heart care, we have created designated Provider Care Teams.  These Care Teams include your primary Cardiologist (physician) and  Advanced Practice Providers (APPs -  Physician Assistants and Nurse Practitioners) who all work together to provide you with the care you need, when you need it.  We recommend signing up for the patient portal called "MyChart".  Sign up information is provided on this After Visit Summary.  MyChart is used to connect with patients for Virtual Visits (Telemedicine).  Patients are able to view lab/test results, encounter notes, upcoming appointments, etc.  Non-urgent messages can be sent to your provider as well.   To learn more about what you can do with MyChart, go to [email or web address].    Your next appointment:   6 month(s)  The format for your next appointment:   In Person  Provider:   Evalina Field, MD              Time Spent with Patient: I have spent a total of 35 minutes with patient reviewing hospital notes, telemetry, EKGs, labs and examining the patient as well as establishing an assessment and plan that was discussed with the patient.  > 50% of time was spent in direct patient care.  Signed, Addison Naegeli. Audie Box, MD, [place]  [address] [place], [place] [address] [phone]  08/22/2021 3:03 PM

## 2021-08-22 ENCOUNTER — Ambulatory Visit: Payer: Medicare Other | Admitting: Cardiovascular Disease

## 2021-08-22 ENCOUNTER — Encounter: Payer: Self-pay | Admitting: Cardiovascular Disease

## 2021-08-22 VITALS — BP 140/90 | HR 65 | Ht 65.0 in | Wt 249.4 lb

## 2021-08-22 DIAGNOSIS — Z952 Presence of prosthetic heart valve: Secondary | ICD-10-CM

## 2021-08-22 DIAGNOSIS — I10 Essential (primary) hypertension: Secondary | ICD-10-CM | POA: Diagnosis not present

## 2021-08-22 DIAGNOSIS — Z79899 Other long term (current) drug therapy: Secondary | ICD-10-CM | POA: Diagnosis not present

## 2021-08-22 DIAGNOSIS — Z5181 Encounter for therapeutic drug level monitoring: Secondary | ICD-10-CM | POA: Diagnosis not present

## 2021-08-22 DIAGNOSIS — I480 Paroxysmal atrial fibrillation: Secondary | ICD-10-CM

## 2021-08-22 DIAGNOSIS — I779 Disorder of arteries and arterioles, unspecified: Secondary | ICD-10-CM

## 2021-08-22 DIAGNOSIS — I2510 Atherosclerotic heart disease of native coronary artery without angina pectoris: Secondary | ICD-10-CM | POA: Diagnosis not present

## 2021-08-22 DIAGNOSIS — E782 Mixed hyperlipidemia: Secondary | ICD-10-CM | POA: Diagnosis not present

## 2021-08-22 DIAGNOSIS — D6869 Other thrombophilia: Secondary | ICD-10-CM | POA: Diagnosis not present

## 2021-08-22 DIAGNOSIS — Z9889 Other specified postprocedural states: Secondary | ICD-10-CM

## 2021-08-22 NOTE — Patient Instructions (Signed)
Medication Instructions:  The current medical regimen is effective;  continue present plan and medications.  *If you need a refill on your cardiac medications before your next appointment, please call your pharmacy*   Lab Work: TSH today   If you have labs (blood work) drawn today and your tests are completely normal, you will receive your results only by: [place] (if you have MyChart) OR A paper copy in the mail If you have any lab test that is abnormal or we need to change your treatment, we will call you to review the results.   Testing/Procedures: Your physician has requested that you have a carotid duplex. This test is an ultrasound of the carotid arteries in your neck. It looks at blood flow through these arteries that supply the brain with blood. Allow one hour for this exam. There are no restrictions or special instructions.  Chest xray - Your physician has requested that you have a chest xray, is a fast and painless imaging test that uses certain electromagnetic waves to create pictures of the structures in and around your chest. This test can help diagnose and monitor conditions such as pneumonia and other lung issues his will be done at [facility], [place]. If you should need to call them their phone number is [phone].   Follow-Up: At [facility], you and your health needs are our priority.  As part of our continuing mission to provide you with exceptional heart care, we have created designated Provider Care Teams.  These Care Teams include your primary Cardiologist (physician) and Advanced Practice Providers (APPs -  Physician Assistants and Nurse Practitioners) who all work together to provide you with the care you need, when you need it.  We recommend signing up for the patient portal called "MyChart".  Sign up information is provided on this After Visit Summary.  MyChart is used to connect with patients for Virtual Visits  (Telemedicine).  Patients are able to view lab/test results, encounter notes, upcoming appointments, etc.  Non-urgent messages can be sent to your provider as well.   To learn more about what you can do with MyChart, go to [email or web address].    Your next appointment:   6 month(s)  The format for your next appointment:   In Person  Provider:   Evalina Field, MD

## 2021-08-23 ENCOUNTER — Ambulatory Visit
Admission: RE | Admit: 2021-08-23 | Discharge: 2021-08-23 | Disposition: A | Discharge status: Home / Self Care | Payer: Medicare Other | Source: Ambulatory Visit | Attending: Cardiovascular Disease | Admitting: Cardiovascular Disease

## 2021-08-23 DIAGNOSIS — M47814 Spondylosis without myelopathy or radiculopathy, thoracic region: Secondary | ICD-10-CM | POA: Diagnosis not present

## 2021-08-23 DIAGNOSIS — I700 Atherosclerosis of aorta: Secondary | ICD-10-CM | POA: Diagnosis not present

## 2021-08-23 DIAGNOSIS — Z79899 Other long term (current) drug therapy: Secondary | ICD-10-CM

## 2021-08-23 LAB — TSH: TSH: 3.79 u[IU]/mL (ref 0.450–4.500)

## 2021-08-24 DIAGNOSIS — G8929 Other chronic pain: Secondary | ICD-10-CM | POA: Diagnosis not present

## 2021-08-24 DIAGNOSIS — M25552 Pain in left hip: Secondary | ICD-10-CM | POA: Diagnosis not present

## 2021-09-04 ENCOUNTER — Ambulatory Visit (HOSPITAL_COMMUNITY)
Admission: RE | Admit: 2021-09-04 | Discharge: 2021-09-04 | Disposition: A | Discharge status: Home / Self Care | Payer: Medicare Other | Source: Ambulatory Visit | Attending: Cardiovascular Disease | Admitting: Cardiovascular Disease

## 2021-09-04 DIAGNOSIS — I779 Disorder of arteries and arterioles, unspecified: Secondary | ICD-10-CM | POA: Diagnosis not present

## 2021-09-04 DIAGNOSIS — I6521 Occlusion and stenosis of right carotid artery: Secondary | ICD-10-CM | POA: Diagnosis not present

## 2021-09-05 ENCOUNTER — Other Ambulatory Visit: Payer: Self-pay | Admitting: *Deleted

## 2021-09-05 DIAGNOSIS — I779 Disorder of arteries and arterioles, unspecified: Secondary | ICD-10-CM

## 2021-09-07 DIAGNOSIS — I739 Peripheral vascular disease, unspecified: Secondary | ICD-10-CM | POA: Diagnosis not present

## 2021-09-07 DIAGNOSIS — L603 Nail dystrophy: Secondary | ICD-10-CM | POA: Diagnosis not present

## 2021-09-07 DIAGNOSIS — I10 Essential (primary) hypertension: Secondary | ICD-10-CM | POA: Diagnosis not present

## 2021-09-07 DIAGNOSIS — E1151 Type 2 diabetes mellitus with diabetic peripheral angiopathy without gangrene: Secondary | ICD-10-CM | POA: Diagnosis not present

## 2021-09-07 DIAGNOSIS — E1169 Type 2 diabetes mellitus with other specified complication: Secondary | ICD-10-CM | POA: Diagnosis not present

## 2021-09-07 DIAGNOSIS — I4819 Other persistent atrial fibrillation: Secondary | ICD-10-CM | POA: Diagnosis not present

## 2021-09-07 DIAGNOSIS — N1832 Chronic kidney disease, stage 3b: Secondary | ICD-10-CM | POA: Diagnosis not present

## 2021-09-25 ENCOUNTER — Other Ambulatory Visit: Payer: Self-pay | Admitting: Cardiovascular Disease

## 2021-09-25 NOTE — Telephone Encounter (Signed)
Prescription refill request for Eliquis received. Indication: Atrial Fib Last office visit: 08/22/21  Bearl Mulberry MD Scr: 1.57 on 05/10/21 Age: 76 Weight: 113.1kg  Based on above findings Eliquis '5mg'$  twice daily is the appropriate dose.  Refill approved,

## 2021-09-27 ENCOUNTER — Telehealth: Payer: Self-pay | Admitting: Cardiovascular Disease

## 2021-09-27 NOTE — Telephone Encounter (Signed)
Pt c/o medication issue:  1. Name of Medication: ELIQUIS 5 MG TABS tablet  2. How are you currently taking this medication (dosage and times per day)? As prescribed   3. Are you having a reaction (difficulty breathing--STAT)? No  4. What is your medication issue? Pt is calling in very upset and sadden by the cost of this medication. Requesting call back to discuss her options. She states she is unable to afford this medication.

## 2021-09-27 NOTE — Telephone Encounter (Signed)
Spoke with pt, aware there is a patient assistance program. Samples and application placed at the front desk for pick up.

## 2021-09-27 NOTE — Telephone Encounter (Signed)
Thank you :)

## 2021-10-02 DIAGNOSIS — G8929 Other chronic pain: Secondary | ICD-10-CM | POA: Diagnosis not present

## 2021-10-02 DIAGNOSIS — M25552 Pain in left hip: Secondary | ICD-10-CM | POA: Diagnosis not present

## 2021-10-06 ENCOUNTER — Telehealth: Payer: Self-pay | Admitting: Cardiovascular Disease

## 2021-10-06 NOTE — Telephone Encounter (Signed)
  Pt said, her thumb, 2nd and 3rd finger very numb in the tip. She would like to speak with a nurse to what she needs to do

## 2021-10-06 NOTE — Telephone Encounter (Signed)
Called patient, she states that her left hand finger tips are tingling. It is not her whole hand, only the tips of her thumb, 2nd and 3rd finger. They are also sore, no color changes to hand or arm. No complaints of her right hand. Patient states this has been going on for 3 days with no improvement. She wanted to make Dr.[name] aware and see what he recommends.    Patient states they also dropped off patient assistance forms on Monday- advised I was out of office that day, and have not checked mail box, I will check it today and will get it faxed off. Patient verbalized understanding.

## 2021-10-12 NOTE — Telephone Encounter (Signed)
Pt's friend Vivia Budge called very upset. He is stating that pt spouse picked up patient assistance form, he faxed over the first part to the pharmaceutical company. He states that he gave the last page to a someone at the front desk for Dr. Marisue Ivan to sign. He wants to know the status of it.  He states that he is going up to the building.

## 2021-10-12 NOTE — Telephone Encounter (Signed)
I contacted patient back, she was upset stating nobody called her back after her recent call. I did discuss with patient we mentioned seeing her PCP to discuss the numbness in her fingers and I would call her back if Dr.[name] had any other recommendations. No other recommendations were given. Patient states that nobody wants to help her and she is just going to deal with it. I advised with her we were here to help her, but patient had no memory of our past conversation. She also states she does not know what is going on with patient assistance forms. I advised I faxed over the forms when I reviewed them on 07/17 and had MD sign them. Patient ended call stating she was appreciative of our help, and I advised her to keep her appointment scheduled for tomorrow on 07/21 with Dr.[name] to further discuss any issues/concerns she may have.   Patient verbalized understanding.

## 2021-10-12 NOTE — Telephone Encounter (Signed)
Spoke to patient she stated she needs Dr.[name] to fax his part of [employer] patient assistance for Eliquis.Stated she has faxed her part and they are waiting on Dr.[name]'s part.Advised I will send message to Dr.[name]'s nurse.

## 2021-10-13 ENCOUNTER — Ambulatory Visit (INDEPENDENT_AMBULATORY_CARE_PROVIDER_SITE_OTHER): Payer: Medicare Other | Admitting: Cardiovascular Disease

## 2021-10-13 ENCOUNTER — Encounter: Payer: Self-pay | Admitting: Cardiovascular Disease

## 2021-10-13 VITALS — BP 187/78 | HR 67 | Ht 65.0 in | Wt 248.4 lb

## 2021-10-13 DIAGNOSIS — E782 Mixed hyperlipidemia: Secondary | ICD-10-CM | POA: Diagnosis not present

## 2021-10-13 DIAGNOSIS — I2510 Atherosclerotic heart disease of native coronary artery without angina pectoris: Secondary | ICD-10-CM | POA: Diagnosis not present

## 2021-10-13 DIAGNOSIS — I779 Disorder of arteries and arterioles, unspecified: Secondary | ICD-10-CM | POA: Diagnosis not present

## 2021-10-13 DIAGNOSIS — I10 Essential (primary) hypertension: Secondary | ICD-10-CM

## 2021-10-13 DIAGNOSIS — Z79899 Other long term (current) drug therapy: Secondary | ICD-10-CM | POA: Diagnosis not present

## 2021-10-13 DIAGNOSIS — D6869 Other thrombophilia: Secondary | ICD-10-CM

## 2021-10-13 DIAGNOSIS — I480 Paroxysmal atrial fibrillation: Secondary | ICD-10-CM

## 2021-10-13 DIAGNOSIS — Z9889 Other specified postprocedural states: Secondary | ICD-10-CM | POA: Diagnosis not present

## 2021-10-13 DIAGNOSIS — Z952 Presence of prosthetic heart valve: Secondary | ICD-10-CM

## 2021-10-13 MED ORDER — HYDRALAZINE HCL 50 MG PO TABS: 50.0000 mg | ORAL_TABLET | Freq: Three times a day (TID) | ORAL | 3 refills | Status: DC

## 2021-10-13 NOTE — Progress Notes (Signed)
Cardiology Office Note:   Date:  10/13/2021  NAME:  Destiny Rice    MRN: [id number] DOB:  1945-06-24   PCP:  Carol Ada, MD  Cardiologist:  Evalina Field, MD  Electrophysiologist:  None   Referring MD: Carol Ada, MD   Chief Complaint  Patient presents with   Hypertension   History of Present Illness:   Destiny Rice is a 76 y.o. female with a hx of aortic stenosis status post TAVR, paroxysmal atrial fibrillation, carotid artery disease, mitral valve repair, CAD status post PCI, diabetes, CKD who presents for follow-up.  She presents with her husband.  She reports she has been on amlodipine for several months.  Apparently she has some numbness and tingling in her left hand.  Her blood pressure is also elevated.  Apparently she has had issues tolerating amlodipine in the past.  She wishes to come off this medication.  We discussed trying hydralazine.  I think this is reasonable.  Her blood pressure is elevated today but she reports it is well controlled at home.  She presents with her husband.  She apparently has been working with physical therapy 2 times per month.  She is doing lots of exercising.  She is doing well on this.  She denies any chest pain or trouble breathing.  Overall seems to be doing quite well.  No evidence of volume overload.  No lower extremity edema.  CV exam is normal.  She is also continued on amiodarone 200 mg daily.  She does not want to change to 100 mg daily.  Problem List 1. CAD -prior PCI 2. Cardiac tumor 3. Mitral Valve Repair -26 mm Edwards annuloplasty ring -12/09/2012 4. Severe aortic stenosis s/p 26 mm Evolut Pro -04/24/2018 4. Paroxysmal Afib 5. Carotid Artery Disease s/p L CEA -R ICA 100% 6. HTN 7. DM -A1c 6.9 8. HLD -T chol 215, HDL 104, LDL 92, triglycerides 112 9. CKD III/IV -Cr 1.85 -eGFR 28  Past Medical History: Past Medical History:  Diagnosis Date   AF (paroxysmal atrial fibrillation) ([place]) 03/10/2020   Chronic kidney  disease, stage 3b ([place]) 03/10/2020   Coronary artery disease    Hypertension    Hypothyroidism 03/10/2020   Mixed hyperlipidemia due to type 2 diabetes mellitus ([place]) 03/10/2020   PONV (postoperative nausea and vomiting)    Type 2 diabetes mellitus with stage 3b chronic kidney disease, without long-term current use of insulin ([place]) 03/10/2020    Past Surgical History: Past Surgical History:  Procedure Laterality Date   CAROTID ENDARTERECTOMY  2021   CORONARY ANGIOPLASTY WITH STENT PLACEMENT     x10   MITRAL VALVE REPAIR  12/09/2012   TRANSCATHETER AORTIC VALVE REPLACEMENT, 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    Current Medications: Current Meds  Medication Sig   ALPRAZolam (XANAX) 0.25 MG tablet Take 0.25 mg by mouth at bedtime as needed for anxiety.   amiodarone (PACERONE) 200 MG tablet Take 200 mg by mouth daily.   atorvastatin (LIPITOR) 20 MG tablet Take 20 mg by mouth daily.   Calcium Carb-Cholecalciferol (CALCIUM 600 + D PO) Take 1 tablet by mouth daily.   carvedilol (COREG) 25 MG tablet Take 25 mg by mouth 2 (two) times daily with a meal.   cholecalciferol (VITAMIN D3) 25 MCG (1000 UNIT) tablet Take 1,000 Units by mouth daily.   Cyanocobalamin (VITAMIN B-12) 5000 MCG TBDP Take 5,000 mcg by mouth daily.   ELIQUIS 5 MG TABS tablet TAKE 1 TABLET BY MOUTH TWICE A DAY  famotidine (PEPCID) 40 MG tablet Take 40 mg by mouth every evening.   Ferrous Sulfate (IRON PO) Take 2.5 mLs by mouth in the morning and at bedtime. Liquid Iron   fluticasone (FLONASE) 50 MCG/ACT nasal spray Place 1 spray into both nostrils daily as needed for allergies or rhinitis.   folic acid (FOLVITE) 356 MCG tablet Take 400 mcg by mouth daily.   hydrALAZINE (APRESOLINE) 50 MG tablet Take 1 tablet (50 mg total) by mouth 3 (three) times daily.   KLOR-CON M20 20 MEQ tablet Take 20 mEq by mouth daily.   levothyroxine (SYNTHROID) 25 MCG tablet Take 25 mcg by mouth daily before breakfast.   meclizine (ANTIVERT) 12.5 MG  tablet Take 12.5 mg by mouth 3 (three) times daily as needed for dizziness.   nitroGLYCERIN (NITROSTAT) 0.4 MG SL tablet Place 0.4 mg under the tongue every 5 (five) minutes as needed for chest pain.   pantoprazole (PROTONIX) 40 MG tablet Take 1 tablet (40 mg total) by mouth daily.   pantoprazole (PROTONIX) 40 MG tablet 1 tablet   traMADol (ULTRAM) 50 MG tablet SMARTSIG:1-2 Tablet(s) By Mouth 2-3 Times Daily PRN   valsartan-hydrochlorothiazide (DIOVAN-HCT) 160-25 MG tablet Take 1 tablet by mouth daily.   [DISCONTINUED] amLODipine (NORVASC) 10 MG tablet Take 10 mg by mouth daily.     Allergies:    Iodine-131 and Sulfa antibiotics   Social History: Social History   Socioeconomic History   Marital status: Married    Spouse name: Not on file   Number of children: 1   Years of education: Not on file   Highest education level: Not on file  Occupational History   Occupation: retired  Tobacco Use   Smoking status: Never   Smokeless tobacco: Never  Substance and Sexual Activity   Alcohol use: Never   Drug use: Never   Sexual activity: Not on file  Other Topics Concern   Not on file  Social History Narrative   Not on file   Social Determinants of Health   Financial Resource Strain: Not on file  Food Insecurity: Not on file  Transportation Needs: Not on file  Physical Activity: Not on file  Stress: Not on file  Social Connections: Not on file     Family History: The patient's family history includes Stroke in her maternal grandmother.  ROS:   All other ROS reviewed and negative. Pertinent positives noted in the HPI.     EKGs/Labs/Other Studies Reviewed:   The following studies were personally reviewed by me today:   TTE 05/06/2020  1. Left ventricular ejection fraction, by estimation, is 55 to 60%. Left  ventricular ejection fraction by 3D volume is 58 %. The left ventricle has  normal function. The left ventricle has no regional wall motion  abnormalities. There is  mild concentric  left ventricular hypertrophy. Left ventricular diastolic function could  not be evaluated. The average left ventricular global longitudinal strain  is -16.0 %. The global longitudinal strain is normal.   2. Right ventricular systolic function is normal. The right ventricular  size is mildly enlarged.   3. Left atrial size was mildly dilated.   4. Right atrial size was mildly dilated.   5. S/P mitral valve annuloplasty ring. The mitral valve has been  repaired/replaced. Trivial mitral valve regurgitation. Moderate mitral  stenosis. The mean mitral valve gradient is 8.0 mmHg. There is a  prosthetic annuloplasty ring present in the mitral  position.   6. The aortic valve has been  repaired/replaced. Aortic valve  regurgitation is not visualized. No aortic stenosis is present. There is a  26 mm CoreValve-Evolut Pro prosthetic (TAVR) valve present in the aortic  position. Aortic valve area, by VTI measures   1.51 cm. Aortic valve mean gradient measures 11.0 mmHg. Aortic valve  Vmax measures 2.12 m/s. Diminsionless index is 0.5. The AVA is likely  underestimated due to small LVOT.   7. The inferior vena cava is normal in size with greater than 50%  respiratory variability, suggesting right atrial pressure of 3 mmHg.   Recent Labs: 08/22/2021: TSH 3.790   Recent Lipid Panel No results found for: "CHOL", "TRIG", "HDL", "CHOLHDL", "VLDL", "LDLCALC", "LDLDIRECT"  Physical Exam:   VS:  BP (!) 187/78   Pulse 67   Ht $R'5\' 5"'iW$  (1.651 m)   Wt 248 lb 6.4 oz (112.7 kg)   SpO2 97%   BMI 41.34 kg/m    Wt Readings from Last 3 Encounters:  10/13/21 248 lb 6.4 oz (112.7 kg)  08/22/21 249 lb 6.4 oz (113.1 kg)  03/02/21 252 lb 12.8 oz (114.7 kg)    General: Well nourished, well developed, in no acute distress Head: Atraumatic, normal size  Eyes: PEERLA, EOMI  Neck: Supple, no JVD Endocrine: No thryomegaly Cardiac: Normal S1, S2; RRR; no murmurs, rubs, or gallops Lungs: Clear to  auscultation bilaterally, no wheezing, rhonchi or rales  Abd: Soft, nontender, no hepatomegaly  Ext: No edema, pulses 2+ Musculoskeletal: No deformities, BUE and BLE strength normal and equal Skin: Warm and dry, no rashes   Neuro: Alert and oriented to person, place, time, and situation, CNII-XII grossly intact, no focal deficits  Psych: Normal mood and affect   ASSESSMENT:   Destiny Rice is a 76 y.o. female who presents for the following: 1. Carotid artery disease, unspecified laterality, unspecified type ([place])   2. Paroxysmal atrial fibrillation ([place])   3. Acquired thrombophilia ([place])   4. On amiodarone therapy   5. Coronary artery disease involving native coronary artery of native heart without angina pectoris   6. Mixed hyperlipidemia   7. Primary hypertension   8. S/P TAVR (transcatheter aortic valve replacement)   9. S/P mitral valve repair     PLAN:   1. Carotid artery disease, unspecified laterality, unspecified type ([place]) -Status post left CEA.  Patent.  Right ICA is 100% occluded.  She will continue with high intensity statin therapy.  2. Paroxysmal atrial fibrillation ([place]) 3. Acquired thrombophilia ([place]) -Maintained on amiodarone for several years.  Not wanting to drop the dose to 100 mg daily.  Continue 200 mg daily.  She does yearly eye exams.  Needs yearly chest x-ray.  X-ray in June was normal.  Thyroid studies are normal.  We will continue to monitor this.  No signs of lung disease.  4. On amiodarone therapy -Yearly eye exam.  Yearly chest x-ray.  Yearly LFTs and thyroid function.  5. Coronary artery disease involving native coronary artery of native heart without angina pectoris 6. Mixed hyperlipidemia -History of PCI in [place].  Not on aspirin is on Eliquis.  Continue Lipitor 20 mg daily.  7. Primary hypertension -Not wanting to take amlodipine anymore.  Stop amlodipine.  Start hydralazine 50 mg 3 times daily.  Continue carvedilol 25 mg twice daily.   Continue Imdur 30 mg daily.  Continue valsartan-HCTZ 160-25 mg daily.  8. S/P TAVR (transcatheter aortic valve replacement) -Stable on recent echo.  9. S/P mitral valve repair -Stable repair with acceptable gradient.  Disposition: Return in about 6 months (around 04/15/2022).  Medication Adjustments/Labs and Tests Ordered: Current medicines are reviewed at length with the patient today.  Concerns regarding medicines are outlined above.  No orders of the defined types were placed in this encounter.  Meds ordered this encounter  Medications   hydrALAZINE (APRESOLINE) 50 MG tablet    Sig: Take 1 tablet (50 mg total) by mouth 3 (three) times daily.    Dispense:  270 tablet    Refill:  3    Patient Instructions  Medication Instructions:  STOP Amlodipine  START Hydralazine 50 mg three times daily   *If you need a refill on your cardiac medications before your next appointment, please call your pharmacy*   Follow-Up: At [facility], you and your health needs are our priority.  As part of our continuing mission to provide you with exceptional heart care, we have created designated Provider Care Teams.  These Care Teams include your primary Cardiologist (physician) and Advanced Practice Providers (APPs -  Physician Assistants and Nurse Practitioners) who all work together to provide you with the care you need, when you need it.  We recommend signing up for the patient portal called "MyChart".  Sign up information is provided on this After Visit Summary.  MyChart is used to connect with patients for Virtual Visits (Telemedicine).  Patients are able to view lab/test results, encounter notes, upcoming appointments, etc.  Non-urgent messages can be sent to your provider as well.   To learn more about what you can do with MyChart, go to [email or web address].    Your next appointment:   Keep appointment in December  The format for your next appointment:   In Person  Provider:    Evalina Field, MD             Time Spent with Patient: I have spent a total of 25 minutes with patient reviewing hospital notes, telemetry, EKGs, labs and examining the patient as well as establishing an assessment and plan that was discussed with the patient.  > 50% of time was spent in direct patient care.  Signed, Addison Naegeli. Audie Box, MD, [place]  [address], [place], [place] [address] [phone]  10/13/2021 2:46 PM

## 2021-10-13 NOTE — Patient Instructions (Signed)
Medication Instructions:  STOP Amlodipine  START Hydralazine 50 mg three times daily   *If you need a refill on your cardiac medications before your next appointment, please call your pharmacy*   Follow-Up: At [facility], you and your health needs are our priority.  As part of our continuing mission to provide you with exceptional heart care, we have created designated Provider Care Teams.  These Care Teams include your primary Cardiologist (physician) and Advanced Practice Providers (APPs -  Physician Assistants and Nurse Practitioners) who all work together to provide you with the care you need, when you need it.  We recommend signing up for the patient portal called "MyChart".  Sign up information is provided on this After Visit Summary.  MyChart is used to connect with patients for Virtual Visits (Telemedicine).  Patients are able to view lab/test results, encounter notes, upcoming appointments, etc.  Non-urgent messages can be sent to your provider as well.   To learn more about what you can do with MyChart, go to [email or web address].    Your next appointment:   Keep appointment in December  The format for your next appointment:   In Person  Provider:   Evalina Field, MD

## 2021-10-14 IMAGING — CT CT ABD-PELV W/O CM
2 of 4 series · 17 of 46 positions shown, 19 images · non-contrast
Comparison: None.

CLINICAL DATA: Nausea, vomiting

EXAM:
CT ABDOMEN AND PELVIS WITHOUT CONTRAST
TECHNIQUE: Multidetector CT imaging of the abdomen and pelvis was performed
following the standard protocol without IV contrast.

[Series 2: axial st · axial · 0.73mm/px · z∈[+1040,+1405]mm · 14 of 81 slices shown, 16 images]
[im 4/81  soft-tissue]
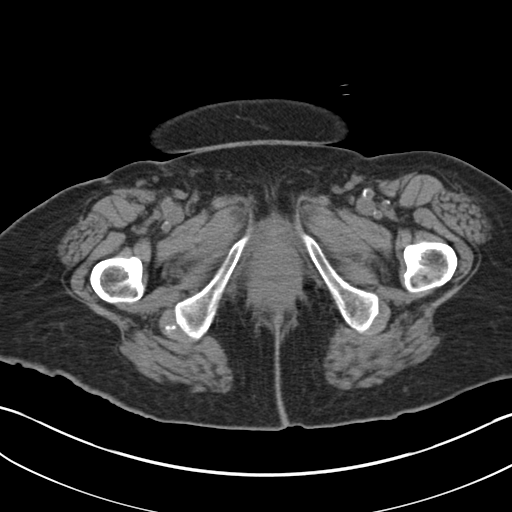
[im 4/81  bone]
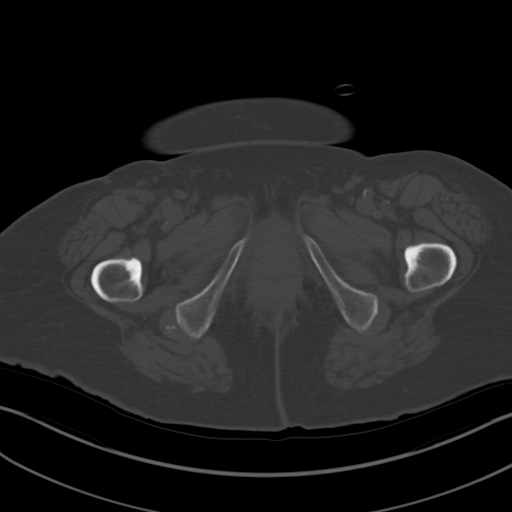
[im 12/81  soft-tissue]
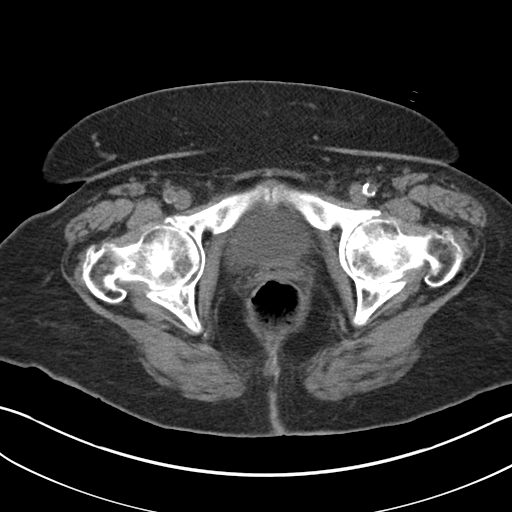
[im 16/81  soft-tissue]
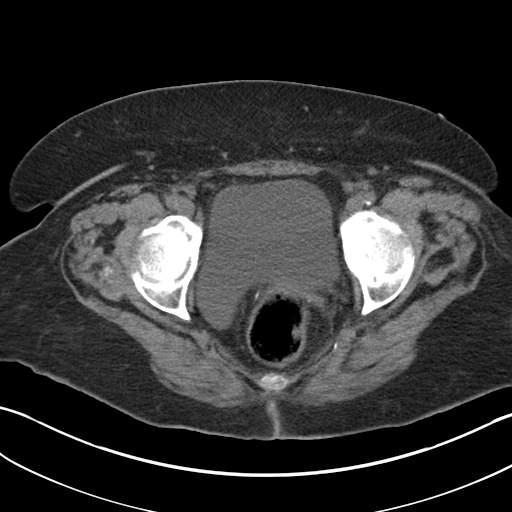
[im 23/81  soft-tissue]
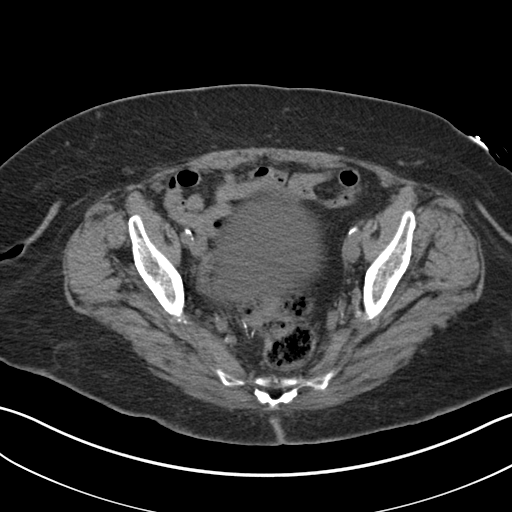
[im 27/81  soft-tissue]
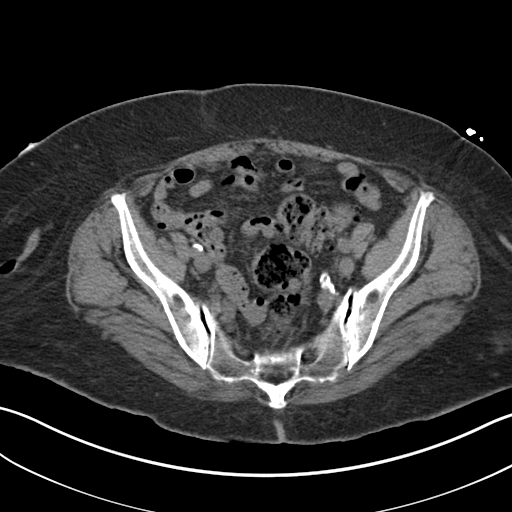
[im 31/81  soft-tissue]
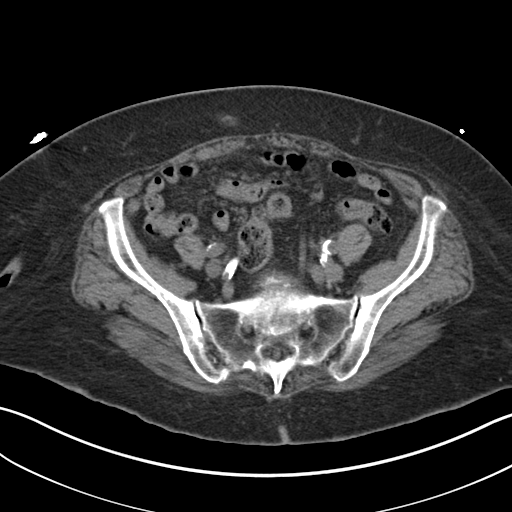
[im 39/81  soft-tissue]
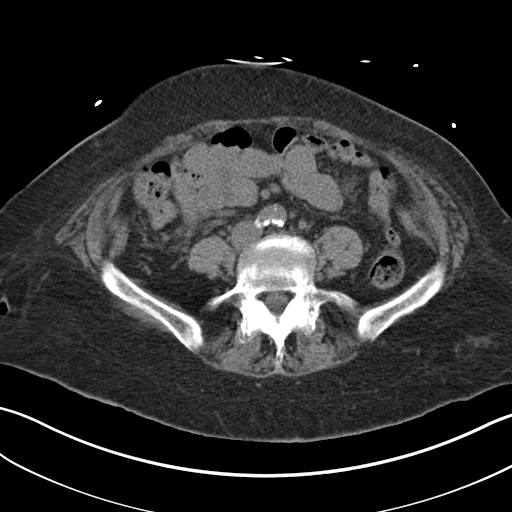
[im 42/81  soft-tissue]
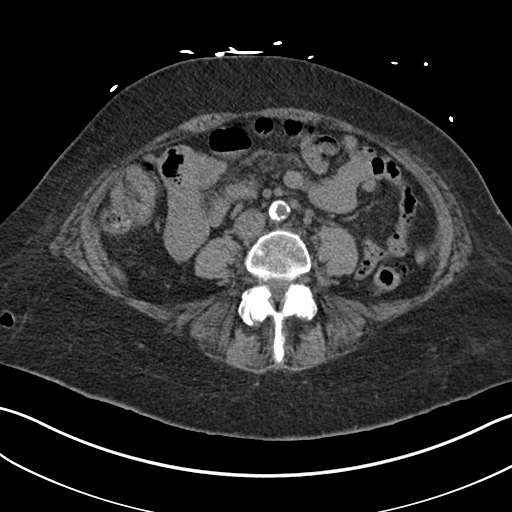
[im 50/81  soft-tissue]
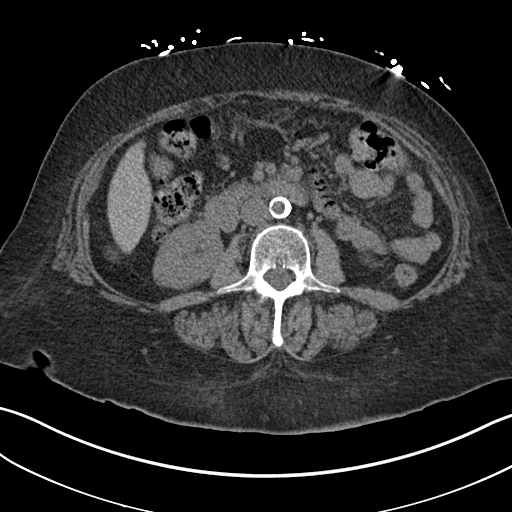
[im 50/81  bone]
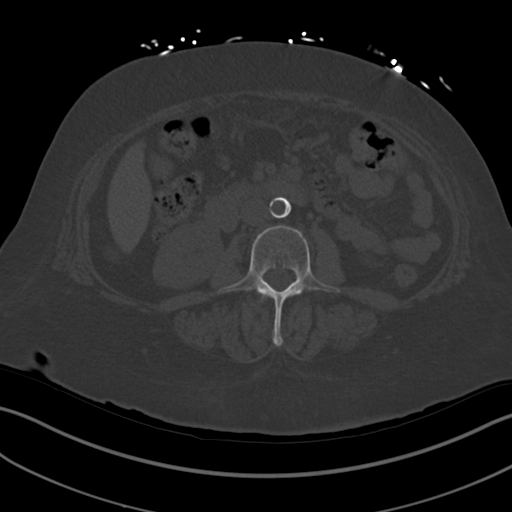
[im 54/81  soft-tissue]
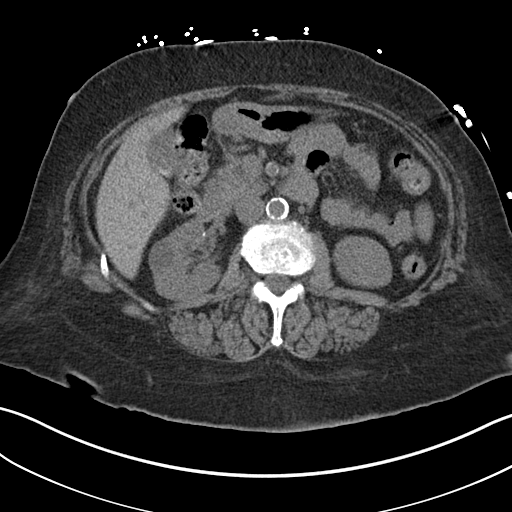
[im 61/81  soft-tissue]
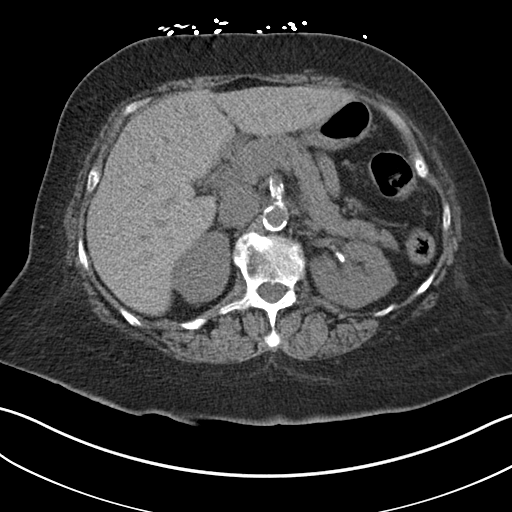
[im 65/81  soft-tissue]
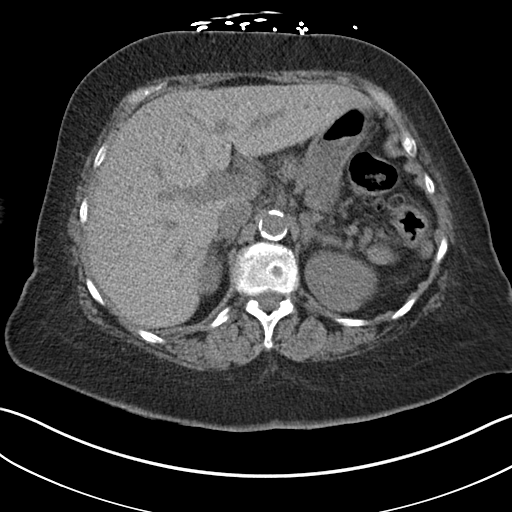
[im 69/81  soft-tissue]
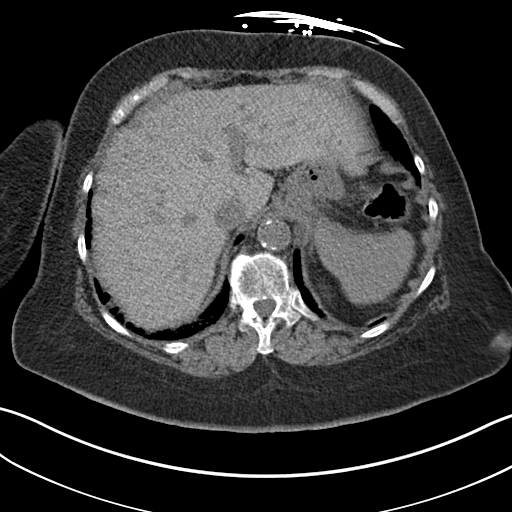
[im 77/81  soft-tissue]
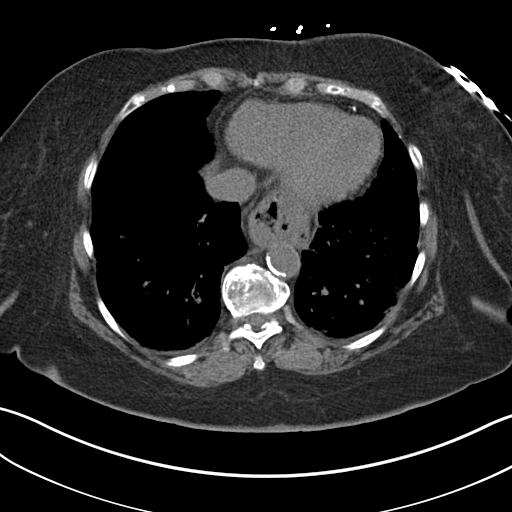

[Series 4: coronal st · coronal · 0.76mm/px · 3 of 143 slices shown]
[im 48/143  soft-tissue]
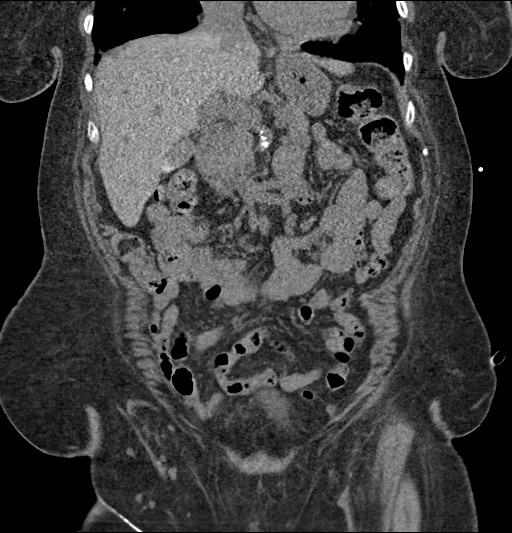
[im 64/143  soft-tissue]
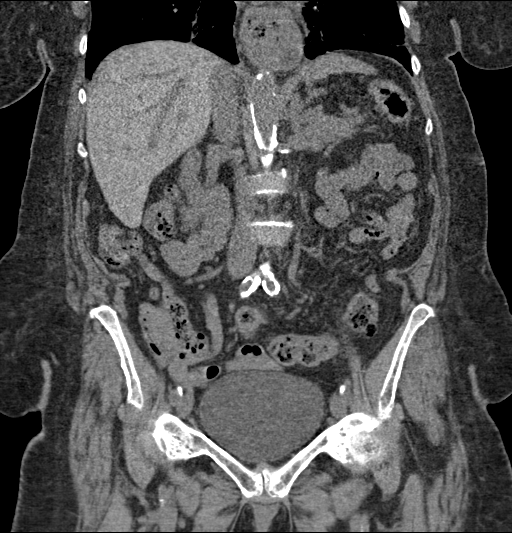
[im 79/143  soft-tissue]
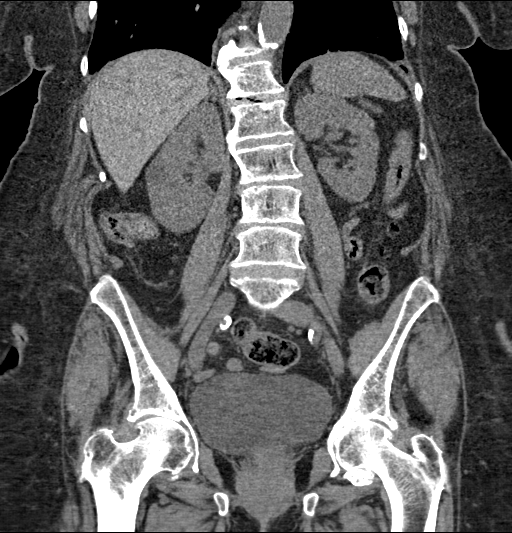

[17 of 46 positions shown; findings below may reference images not displayed]

FINDINGS: Lower chest: Moderate-sized hiatal hernia. Peribronchial thickening
in the lower lobes. Bibasilar opacities, likely atelectasis. No
effusions.

Hepatobiliary: Small layering gallstones within the gallbladder. No
focal hepatic abnormality.

Pancreas: No focal abnormality or ductal dilatation.

Spleen: No focal abnormality.  Normal size.

Adrenals/Urinary Tract: Small bilateral renal cysts. Renovascular
calcifications. No ureteral stones or hydronephrosis. Adrenal glands
and urinary bladder unremarkable.

Stomach/Bowel: Colonic diverticulosis. No active diverticulitis.
Normal appendix. Stomach and small bowel decompressed, unremarkable.

Vascular/Lymphatic: Aortic atherosclerosis. No evidence of aneurysm
or adenopathy.

Reproductive: Prior hysterectomy.  No adnexal masses.

Other: No free fluid or free air.

Musculoskeletal: No acute bony abnormality.
IMPRESSION: Peribronchial thickening in the lower lobes.  Bibasilar atelectasis.

Moderate-sized hiatal hernia.

Cholelithiasis.

Colonic diverticulosis.  No active diverticulitis.

Aortic atherosclerosis.

## 2021-10-16 DIAGNOSIS — G8929 Other chronic pain: Secondary | ICD-10-CM | POA: Diagnosis not present

## 2021-10-16 DIAGNOSIS — M25552 Pain in left hip: Secondary | ICD-10-CM | POA: Diagnosis not present

## 2021-10-17 ENCOUNTER — Telehealth: Payer: Self-pay | Admitting: Cardiovascular Disease

## 2021-10-17 NOTE — Telephone Encounter (Signed)
Patient reports yesterday BP 135/58 and 139/62 with P in low 60s before he hydralazine. Today, after hydralazine, BP 135/48, P 60. She reports dizziness and wants to know if she is taking too much of the hydralazine.

## 2021-10-17 NOTE — Telephone Encounter (Signed)
I called patient, advised of message from MD.  She will try the 25 mg three times daily, and let us know if she has any other issues.   Thanks!

## 2021-10-17 NOTE — Telephone Encounter (Signed)
Pt c/o BP issue: STAT if pt c/o blurred vision, one-sided weakness or slurred speech  1. What are your last 5 BP readings?  136/48 HR 60 135/58 HR 60 139/62 HR 63  2. Are you having any other symptoms (ex. Dizziness, headache, blurred vision, passed out)? Pt states that she feels lightheaded and blurred vision   3. What is your BP issue? Pt states that her medication was changed to hydralazine 50 mg and believes this may be the issues, but would like to speak to someone.

## 2021-10-23 ENCOUNTER — Telehealth: Payer: Self-pay | Admitting: Cardiovascular Disease

## 2021-10-23 NOTE — Telephone Encounter (Signed)
Tamera with [facility] calling to speak with Almyra Free to see if the patient is treated inpatient or outpatient. Phone: [phone] or Fax: [phone]

## 2021-10-23 NOTE — Telephone Encounter (Signed)
Returned call to [employer] regarding patient assistance application. Advised them that patient is being treated outpatient. They stated that they updated the application and will continue processing. Will forward to [place], LPN to make aware.

## 2021-10-24 ENCOUNTER — Telehealth: Payer: Self-pay | Admitting: Cardiovascular Disease

## 2021-10-24 NOTE — Telephone Encounter (Signed)
Noted! Thank you

## 2021-10-24 NOTE — Telephone Encounter (Signed)
I did not need this encounter. °

## 2021-10-31 ENCOUNTER — Telehealth: Payer: Self-pay | Admitting: Cardiovascular Disease

## 2021-10-31 NOTE — Telephone Encounter (Signed)
Pt c/o Shortness Of Breath: STAT if SOB developed within the last 24 hours or pt is noticeably SOB on the phone  1. Are you currently SOB (can you hear that pt is SOB on the phone)?  No   2. How long have you been experiencing SOB? Since 8/05  3. Are you SOB when sitting or when up moving around?  When laying down   4. Are you currently experiencing any other symptoms?  Leg pain

## 2021-10-31 NOTE — Progress Notes (Unsigned)
Cardiology Office Note:   Date:  10/31/2021  NAME:  Destiny Rice    MRN: [id number] DOB:  1945-05-27   PCP:  Carol Ada, MD  Cardiologist:  Evalina Field, MD  Electrophysiologist:  None   Referring MD: Carol Ada, MD   No chief complaint on file. ***  History of Present Illness:   Destiny Rice is a 76 y.o. female with a hx of CAD s/p PCI, MV repair, severe AS s/p TAVR, HTN, DM, CKD III/IV, HLD, carotid disease, HTN who presents for follow-up.   Problem List 1. CAD -prior PCI 2. Cardiac tumor 3. Mitral Valve Repair -26 mm Edwards annuloplasty ring -12/09/2012 4. Severe aortic stenosis s/p 26 mm Evolut Pro -04/24/2018 4. Paroxysmal Afib 5. Carotid Artery Disease s/p L CEA -R ICA 100% 6. HTN 7. DM -A1c 6.9 8. HLD -T chol 215, HDL 104, LDL 92, triglycerides 112 9. CKD III/IV -Cr 1.85 -eGFR 28  Past Medical History: Past Medical History:  Diagnosis Date   AF (paroxysmal atrial fibrillation) ([place]) 03/10/2020   Chronic kidney disease, stage 3b ([place]) 03/10/2020   Coronary artery disease    Hypertension    Hypothyroidism 03/10/2020   Mixed hyperlipidemia due to type 2 diabetes mellitus ([place]) 03/10/2020   PONV (postoperative nausea and vomiting)    Type 2 diabetes mellitus with stage 3b chronic kidney disease, without long-term current use of insulin ([place]) 03/10/2020    Past Surgical History: Past Surgical History:  Procedure Laterality Date   CAROTID ENDARTERECTOMY  2021   CORONARY ANGIOPLASTY WITH STENT PLACEMENT     x10   MITRAL VALVE REPAIR  12/09/2012   TRANSCATHETER AORTIC VALVE REPLACEMENT, 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    Current Medications: No outpatient medications have been marked as taking for the 11/02/21 encounter (Appointment) with Geralynn Rile, MD.     Allergies:    Iodine-131 and Sulfa antibiotics   Social History: Social History   Socioeconomic History   Marital status: Married    Spouse name: Not on file   Number of  children: 1   Years of education: Not on file   Highest education level: Not on file  Occupational History   Occupation: retired  Tobacco Use   Smoking status: Never   Smokeless tobacco: Never  Substance and Sexual Activity   Alcohol use: Never   Drug use: Never   Sexual activity: Not on file  Other Topics Concern   Not on file  Social History Narrative   Not on file   Social Determinants of Health   Financial Resource Strain: Not on file  Food Insecurity: Not on file  Transportation Needs: Not on file  Physical Activity: Not on file  Stress: Not on file  Social Connections: Not on file     Family History: The patient's ***family history includes Stroke in her maternal grandmother.  ROS:   All other ROS reviewed and negative. Pertinent positives noted in the HPI.     EKGs/Labs/Other Studies Reviewed:   The following studies were personally reviewed by me today:  EKG:  EKG is *** ordered today.  The ekg ordered today demonstrates ***, and was personally reviewed by me.   Vasc US Carotid 09/04/2021 Summary:  Right Carotid: Evidence consistent with a total occlusion of the right  ICA.   Left Carotid: The carotid endarterectomy site was well visualize  demonstrating                normal patency with no evidence  of significant diameter  reduction.   Vertebrals:  Left vertebral artery demonstrates antegrade flow. Right  vertebral               artery demonstrates no discernable flow.  Subclavians: Normal flow hemodynamics were seen in bilateral subclavian               arteries.   TTE 05/07/2020  1. Left ventricular ejection fraction, by estimation, is 55 to 60%. Left  ventricular ejection fraction by 3D volume is 58 %. The left ventricle has  normal function. The left ventricle has no regional wall motion  abnormalities. There is mild concentric  left ventricular hypertrophy. Left ventricular diastolic function could  not be evaluated. The average left  ventricular global longitudinal strain  is -16.0 %. The global longitudinal strain is normal.   2. Right ventricular systolic function is normal. The right ventricular  size is mildly enlarged.   3. Left atrial size was mildly dilated.   4. Right atrial size was mildly dilated.   5. S/P mitral valve annuloplasty ring. The mitral valve has been  repaired/replaced. Trivial mitral valve regurgitation. Moderate mitral  stenosis. The mean mitral valve gradient is 8.0 mmHg. There is a  prosthetic annuloplasty ring present in the mitral  position.   6. The aortic valve has been repaired/replaced. Aortic valve  regurgitation is not visualized. No aortic stenosis is present. There is a  26 mm CoreValve-Evolut Pro prosthetic (TAVR) valve present in the aortic  position. Aortic valve area, by VTI measures   1.51 cm. Aortic valve mean gradient measures 11.0 mmHg. Aortic valve  Vmax measures 2.12 m/s. Diminsionless index is 0.5. The AVA is likely  underestimated due to small LVOT.   7. The inferior vena cava is normal in size with greater than 50%  respiratory variability, suggesting right atrial pressure of 3 mmHg.  Recent Labs: 08/22/2021: TSH 3.790   Recent Lipid Panel No results found for: "CHOL", "TRIG", "HDL", "CHOLHDL", "VLDL", "LDLCALC", "LDLDIRECT"  Physical Exam:   VS:  There were no vitals taken for this visit.   Wt Readings from Last 3 Encounters:  10/13/21 248 lb 6.4 oz (112.7 kg)  08/22/21 249 lb 6.4 oz (113.1 kg)  03/02/21 252 lb 12.8 oz (114.7 kg)    General: Well nourished, well developed, in no acute distress Head: Atraumatic, normal size  Eyes: PEERLA, EOMI  Neck: Supple, no JVD Endocrine: No thryomegaly Cardiac: Normal S1, S2; RRR; no murmurs, rubs, or gallops Lungs: Clear to auscultation bilaterally, no wheezing, rhonchi or rales  Abd: Soft, nontender, no hepatomegaly  Ext: No edema, pulses 2+ Musculoskeletal: No deformities, BUE and BLE strength normal and  equal Skin: Warm and dry, no rashes   Neuro: Alert and oriented to person, place, time, and situation, CNII-XII grossly intact, no focal deficits  Psych: Normal mood and affect   ASSESSMENT:   Destiny Rice is a 76 y.o. female who presents for the following: No diagnosis found.  PLAN:   There are no diagnoses linked to this encounter.  {Are you ordering a CV Procedure (e.g. stress test, cath, DCCV, TEE, etc)?   Press F2        :[id number]}  Disposition: No follow-ups on file.  Medication Adjustments/Labs and Tests Ordered: Current medicines are reviewed at length with the patient today.  Concerns regarding medicines are outlined above.  No orders of the defined types were placed in this encounter.  No orders of the defined types were placed  in this encounter.   There are no Patient Instructions on file for this visit.   Time Spent with Patient: I have spent a total of *** minutes with patient reviewing hospital notes, telemetry, EKGs, labs and examining the patient as well as establishing an assessment and plan that was discussed with the patient.  > 50% of time was spent in direct patient care.  Signed, Addison Naegeli. Audie Box, MD, [facility]  [address] [place], [place] [address] [phone]  10/31/2021 5:17 PM   ]

## 2021-10-31 NOTE — Telephone Encounter (Signed)
Spoke to patient . She states she became short of breath  Saturday - experiencing shortness of breath-- only after being out. She states some chest pressure with the episode and once she was outside her home  she states she had to go the the car and cut on the air conditioner and  then put a mask on and it helped.  She had the same episodes on Monday as well.   Today's blood pressure  153/63 pulse 64.  She has not weighed herself she states .she states she has vertigo. Patient is able to talk to RN without difficult.    Appointment made for 11/02/21 with Dr Audie Box. Patient aware  if symptoms become worse to go to Er for evaluation.  Patient states she is taking her medications but has not taken them this morning

## 2021-11-02 ENCOUNTER — Ambulatory Visit: Payer: Medicare Other | Admitting: Cardiovascular Disease

## 2021-11-02 ENCOUNTER — Encounter: Payer: Self-pay | Admitting: Cardiovascular Disease

## 2021-11-02 VITALS — BP 154/86 | HR 63 | Wt 252.4 lb

## 2021-11-02 DIAGNOSIS — D6869 Other thrombophilia: Secondary | ICD-10-CM

## 2021-11-02 DIAGNOSIS — E782 Mixed hyperlipidemia: Secondary | ICD-10-CM

## 2021-11-02 DIAGNOSIS — Z9889 Other specified postprocedural states: Secondary | ICD-10-CM

## 2021-11-02 DIAGNOSIS — I10 Essential (primary) hypertension: Secondary | ICD-10-CM

## 2021-11-02 DIAGNOSIS — Z952 Presence of prosthetic heart valve: Secondary | ICD-10-CM | POA: Diagnosis not present

## 2021-11-02 DIAGNOSIS — I480 Paroxysmal atrial fibrillation: Secondary | ICD-10-CM

## 2021-11-02 DIAGNOSIS — I2510 Atherosclerotic heart disease of native coronary artery without angina pectoris: Secondary | ICD-10-CM

## 2021-11-02 DIAGNOSIS — R0602 Shortness of breath: Secondary | ICD-10-CM

## 2021-11-02 NOTE — Patient Instructions (Signed)
Medication Instructions:  The current medical regimen is effective;  continue present plan and medications.  *If you need a refill on your cardiac medications before your next appointment, please call your pharmacy*   Follow-Up: At [facility], you and your health needs are our priority.  As part of our continuing mission to provide you with exceptional heart care, we have created designated Provider Care Teams.  These Care Teams include your primary Cardiologist (physician) and Advanced Practice Providers (APPs -  Physician Assistants and Nurse Practitioners) who all work together to provide you with the care you need, when you need it.  We recommend signing up for the patient portal called "MyChart".  Sign up information is provided on this After Visit Summary.  MyChart is used to connect with patients for Virtual Visits (Telemedicine).  Patients are able to view lab/test results, encounter notes, upcoming appointments, etc.  Non-urgent messages can be sent to your provider as well.   To learn more about what you can do with MyChart, go to [email or web address].    Your next appointment:   As planned for December  The format for your next appointment:   In Person  Provider:   Evalina Field, MD

## 2021-11-02 NOTE — Addendum Note (Signed)
Addended by: Michelle Nasuti on: 11/02/2021 02:42 PM   Modules accepted: Orders

## 2021-11-14 DIAGNOSIS — M25552 Pain in left hip: Secondary | ICD-10-CM | POA: Diagnosis not present

## 2021-11-14 DIAGNOSIS — G8929 Other chronic pain: Secondary | ICD-10-CM | POA: Diagnosis not present

## 2021-11-14 DIAGNOSIS — M6281 Muscle weakness (generalized): Secondary | ICD-10-CM | POA: Diagnosis not present

## 2021-11-16 DIAGNOSIS — N1832 Chronic kidney disease, stage 3b: Secondary | ICD-10-CM | POA: Diagnosis not present

## 2021-11-28 DIAGNOSIS — M25552 Pain in left hip: Secondary | ICD-10-CM | POA: Diagnosis not present

## 2021-11-28 DIAGNOSIS — G8929 Other chronic pain: Secondary | ICD-10-CM | POA: Diagnosis not present

## 2021-11-30 DIAGNOSIS — N2581 Secondary hyperparathyroidism of renal origin: Secondary | ICD-10-CM | POA: Diagnosis not present

## 2021-11-30 DIAGNOSIS — E785 Hyperlipidemia, unspecified: Secondary | ICD-10-CM | POA: Diagnosis not present

## 2021-11-30 DIAGNOSIS — N1832 Chronic kidney disease, stage 3b: Secondary | ICD-10-CM | POA: Diagnosis not present

## 2021-11-30 DIAGNOSIS — I129 Hypertensive chronic kidney disease with stage 1 through stage 4 chronic kidney disease, or unspecified chronic kidney disease: Secondary | ICD-10-CM | POA: Diagnosis not present

## 2021-11-30 DIAGNOSIS — D631 Anemia in chronic kidney disease: Secondary | ICD-10-CM | POA: Diagnosis not present

## 2021-12-15 ENCOUNTER — Telehealth: Payer: Self-pay | Admitting: Cardiovascular Disease

## 2021-12-15 NOTE — Telephone Encounter (Signed)
Patient states ever since Sunday, 9/17, she has been very congested in her nose and head. Declines additional symptoms. She would like to know what Dr. Audie Box recommends. Please advise.

## 2021-12-15 NOTE — Telephone Encounter (Signed)
Returned call to patient and advised her of Dr. Kathalene Frames recommendations. Patient states that she also had chest pain earlier in the week around Sunday/Monday but that she took something and it went away and has not returned. Advised her that if chest pain returned or any symptoms worsen she should report to the ER or call 911 for evaluation. Patient verbalized understanding.   Geralynn Rile, MD  You; Caprice Beaver, LPN 37 minutes ago (4:85 PM)    Agree with your recommendations. -W

## 2021-12-15 NOTE — Telephone Encounter (Signed)
Returned call to patient who states that she is having a lot of sinus congestion and issues and states she used to take a z pack and that got rid of it for her. Advised patient that is not something that we typically prescribe and advised patient to reach out to her PCP about this. Patient states she asked but they said that she would need a Covid test and she states that she does not have Covid. Patient would like to know if she can take sudafed OTC- advised patient to stay away from cold medications that contain pseudoephedrine per pharmacy. Patient aware and verbalized understanding. Patient states she would still like to see if Dr. Audie Box has any recommendations. Advised patient I would forward message to him.

## 2021-12-21 DIAGNOSIS — I4819 Other persistent atrial fibrillation: Secondary | ICD-10-CM | POA: Diagnosis not present

## 2021-12-21 DIAGNOSIS — E782 Mixed hyperlipidemia: Secondary | ICD-10-CM | POA: Diagnosis not present

## 2021-12-21 DIAGNOSIS — N1832 Chronic kidney disease, stage 3b: Secondary | ICD-10-CM | POA: Diagnosis not present

## 2021-12-21 DIAGNOSIS — E039 Hypothyroidism, unspecified: Secondary | ICD-10-CM | POA: Diagnosis not present

## 2021-12-21 DIAGNOSIS — I10 Essential (primary) hypertension: Secondary | ICD-10-CM | POA: Diagnosis not present

## 2021-12-21 DIAGNOSIS — E1169 Type 2 diabetes mellitus with other specified complication: Secondary | ICD-10-CM | POA: Diagnosis not present

## 2022-01-03 DIAGNOSIS — G8929 Other chronic pain: Secondary | ICD-10-CM | POA: Diagnosis not present

## 2022-01-03 DIAGNOSIS — M25552 Pain in left hip: Secondary | ICD-10-CM | POA: Diagnosis not present

## 2022-01-15 ENCOUNTER — Telehealth: Payer: Self-pay | Admitting: Cardiovascular Disease

## 2022-01-15 NOTE — Telephone Encounter (Signed)
*  STAT* If patient is at the pharmacy, call can be transferred to refill team.   1. Which medications need to be refilled? (please list name of each medication and dose if known)   amiodarone (PACERONE) 200 MG tablet  2. Which pharmacy/location (including street and city if local pharmacy) is medication to be sent to?  [id number] [place], [place] - [address]  3. Do they need a 30 day or 90 day supply? 90 days   Patient stated her medication dosage was reduced to 100 mg but she started having pains and was put back on 200 mg.  Patient stated the pharmacy is requested a new prescription for the 200 mg dosage.  Patient is almost out of this medication.

## 2022-01-16 MED ORDER — AMIODARONE HCL 200 MG PO TABS: 200.0000 mg | ORAL_TABLET | Freq: Every day | ORAL | 10 refills | Status: DC

## 2022-01-17 ENCOUNTER — Encounter: Payer: Self-pay | Admitting: Cardiovascular Disease

## 2022-01-17 NOTE — Telephone Encounter (Signed)
Error

## 2022-01-21 ENCOUNTER — Other Ambulatory Visit: Payer: Self-pay | Admitting: Cardiovascular Disease

## 2022-01-29 ENCOUNTER — Ambulatory Visit (INDEPENDENT_AMBULATORY_CARE_PROVIDER_SITE_OTHER): Payer: Medicare Other | Admitting: Family Medicine

## 2022-01-29 ENCOUNTER — Encounter: Payer: Self-pay | Admitting: Family Medicine

## 2022-01-29 VITALS — BP 160/80 | HR 60 | Temp 98.1°F | Ht 65.0 in | Wt 251.0 lb

## 2022-01-29 DIAGNOSIS — I1310 Hypertensive heart and chronic kidney disease without heart failure, with stage 1 through stage 4 chronic kidney disease, or unspecified chronic kidney disease: Secondary | ICD-10-CM

## 2022-01-29 DIAGNOSIS — I25118 Atherosclerotic heart disease of native coronary artery with other forms of angina pectoris: Secondary | ICD-10-CM | POA: Diagnosis not present

## 2022-01-29 DIAGNOSIS — K219 Gastro-esophageal reflux disease without esophagitis: Secondary | ICD-10-CM | POA: Diagnosis not present

## 2022-01-29 DIAGNOSIS — E038 Other specified hypothyroidism: Secondary | ICD-10-CM

## 2022-01-29 DIAGNOSIS — E1122 Type 2 diabetes mellitus with diabetic chronic kidney disease: Secondary | ICD-10-CM

## 2022-01-29 DIAGNOSIS — I480 Paroxysmal atrial fibrillation: Secondary | ICD-10-CM | POA: Diagnosis not present

## 2022-01-29 DIAGNOSIS — N1832 Chronic kidney disease, stage 3b: Secondary | ICD-10-CM | POA: Diagnosis not present

## 2022-01-29 MED ORDER — CARVEDILOL 25 MG PO TABS: 25.0000 mg | ORAL_TABLET | Freq: Two times a day (BID) | ORAL | 3 refills | Status: DC

## 2022-01-29 MED ORDER — VALSARTAN-HYDROCHLOROTHIAZIDE 160-25 MG PO TABS
1.0000 | ORAL_TABLET | Freq: Every day | ORAL | 3 refills | Status: DC
Start: 2022-01-29 — End: 2023-07-01

## 2022-01-29 MED ORDER — LEVOTHYROXINE SODIUM 25 MCG PO TABS
25.0000 ug | ORAL_TABLET | Freq: Every day | ORAL | 3 refills | Status: DC
Start: 2022-01-29 — End: 2022-05-29

## 2022-01-29 MED ORDER — MECLIZINE HCL 12.5 MG PO TABS
12.5000 mg | ORAL_TABLET | Freq: Three times a day (TID) | ORAL | 1 refills | Status: AC | PRN
Start: 2022-01-29 — End: ?

## 2022-01-29 MED ORDER — PANTOPRAZOLE SODIUM 40 MG PO TBEC: 40.0000 mg | DELAYED_RELEASE_TABLET | Freq: Two times a day (BID) | ORAL | 3 refills | Status: DC

## 2022-01-29 MED ORDER — POTASSIUM CHLORIDE CRYS ER 20 MEQ PO TBCR: 20.0000 meq | EXTENDED_RELEASE_TABLET | Freq: Every day | ORAL | 3 refills | Status: DC

## 2022-01-29 NOTE — Patient Instructions (Addendum)
Welcome to [facility]! It was a pleasure meeting you today.  As discussed, Please schedule a 6 month follow up visit today.  Pantaprazole twice/day for 1-2 months.  If not better, let me know.  If better, try daily again.  Stop famotidine.  PLEASE NOTE:  If you had any LAB tests please let us know if you have not heard back within a few days. You may see your results on MyChart before we have a chance to review them but we will give you a call once they are reviewed by [place]. If we ordered any REFERRALS today, please let us know if you have not heard from their office within the next week.  Let us know through MyChart if you are needing REFILLS, or have your pharmacy send [place] the request. You can also use MyChart to communicate with me or any office staff.  Please try these tips to maintain a healthy lifestyle:  Eat most of your calories during the day when you are active. Eliminate processed foods including packaged sweets (pies, cakes, cookies), reduce intake of potatoes, white bread, white pasta, and white rice. Look for whole grain options, oat flour or almond flour.  Each meal should contain half fruits/vegetables, one quarter protein, and one quarter carbs (no bigger than a computer mouse).  Cut down on sweet beverages. This includes juice, soda, and sweet tea. Also watch fruit intake, though this is a healthier sweet option, it still contains natural sugar! Limit to 3 servings daily.  Drink at least 1 glass of water with each meal and aim for at least 8 glasses per day  Exercise at least 150 minutes every week.

## 2022-01-29 NOTE — Progress Notes (Signed)
New Patient Office Visit  Subjective:  Patient ID: Destiny Rice, female    DOB: Sep 10, 1945  Age: 76 y.o. MRN: [id number]  CC:  Chief Complaint  Patient presents with   Establish Care    Need new pcp, changing primary care doctors     HPI-here w/husband.  Moved from [place] presents for new pt.  From [place].  Insurance changing to [place] and I'm on [employer].   DM type 2 w/CKD stage 3b- sugars fasting 128-191-usu 140's. No meds.  Sees Dr. Henriette [facility].  Diet controlled.  Egg whites on mini bagle and coffee w/hazelnut creamer.  Bakes foods. Drinks 46 oz water/day. Starting to walk again-went to PT-L hip/knee pain.  A1C 2 mo ago per pt-7    Dr. Tobias Alexander wanted to put her on Farxiga, but pt can't afford.   Afib, CAD, valve replacements-card follows.   HTN-125-130/70's.  "White coat".  Hydralazine-50 in am and 25 in pm-per pt, was told to figure out her ideal dose as home bp's so different that office.  Per pt, if cuff on upper arm and too tight, will pass out.  Occ sob(old).  Hypothyroidism-on syn thyroid 0.'025mg'$ .  per pt, labs done by neph  Past Medical History:  Diagnosis Date   AF (paroxysmal atrial fibrillation) ([place]) 03/10/2020   Allergy    Chronic kidney disease, stage 3b ([place]) 03/10/2020   Coronary artery disease    10 stents -started 2014   Hypertension    Hypothyroidism 03/10/2020   Lipoma 2014   heart   Mixed hyperlipidemia due to type 2 diabetes mellitus ([place]) 03/10/2020   PONV (postoperative nausea and vomiting)    Type 2 diabetes mellitus with stage 3b chronic kidney disease, without long-term current use of insulin ([place]) 03/10/2020    Past Surgical History:  Procedure Laterality Date   ABDOMINAL HYSTERECTOMY     and bso   CAROTID ENDARTERECTOMY Left 2021   CORONARY ANGIOPLASTY WITH STENT PLACEMENT     x10   MITRAL VALVE REPAIR  12/09/2012   TRANSCATHETER AORTIC VALVE REPLACEMENT, 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    Family History  Problem  Relation Age of Onset   Hypertension Mother    Alcohol abuse Mother    Alcohol abuse Father    Hypertension Sister    Stroke Maternal Grandmother     Social History   Socioeconomic History   Marital status: Married    Spouse name: Not on file   Number of children: 1   Years of education: Not on file   Highest education level: Not on file  Occupational History   Occupation: retired  Tobacco Use   Smoking status: Never   Smokeless tobacco: Never  Vaping Use   Vaping Use: Never used  Substance and Sexual Activity   Alcohol use: Not Currently   Drug use: Never   Sexual activity: Not Currently  Other Topics Concern   Not on file  Social History Narrative   Son   4 grands, greats 12      Retired housekeeping.   Social Determinants of Health   Financial Resource Strain: Not on file  Food Insecurity: Not on file  Transportation Needs: Not on file  Physical Activity: Not on file  Stress: Not on file  Social Connections: Not on file  Intimate Partner Violence: Not on file    ROS  ROS: Gen: no fever, chills  Skin: no rash, itching ENT: no ear pain, ear drainage, nasal  congestion, rhinorrhea, sinus pressure, sore throat Eyes: no blurry vision, double vision Resp: no cough, wheeze CV: no CP, palpitations, LE edema,  GI: GERD-pantaprazole daily.  Still some GERD after supper. And can wake her up.  No dysphagia. Taking pepcid at night and still symptoms GU: no dysuria, urgency, frequency, hematuria MSK: some arthralgias. L hip-seeing [facility]o at [facility] and now PT[facility]: no  headache.  Gets vertigo at times.  Has been to ER.  Taking meclizine intermitt but more so recently. Marland Kitchen  Psych: no depression, anxiety, insomnia, SI   Objective:   Today's Vitals: BP (!) 160/80 (BP Location: Left Arm, Patient Position: Sitting, Cuff Size: Large)   Pulse 60   Temp 98.1 F (36.7 C) (Temporal)   Ht '5\' 5"'$  (1.651 m)   Wt 251 lb (113.9 kg)   SpO2 95%   BMI 41.77 kg/m   Physical  Exam  Gen: WDWN NAD OAAF HEENT: NCAT, conjunctiva not injected, sclera nonicteric NECK:  supple, no thyromegaly, no nodes, no carotid bruits CARDIAC: RRR, S1S2+, 2/6 murmur. DP 2+B LUNGS: CTAB. No wheezes ABDOMEN:  BS+, soft, NTND, No HSM, no masses EXT: 1+ edema MSK: no gross abnormalities. cane NEURO: A&O x3.  CN II-XII intact.  PSYCH: normal mood. Good eye contact   Spent 42mn w/pt getting history, reviewing meds, renewing. Discussing diet/exercise.   Assessment & Plan:   Problem List Items Addressed This Visit       Cardiovascular and Mediastinum   AF (paroxysmal atrial fibrillation) (HCC)   Relevant Medications   carvedilol (COREG) 25 MG tablet   valsartan-hydrochlorothiazide (DIOVAN-HCT) 160-25 MG tablet   Coronary artery disease   Relevant Medications   carvedilol (COREG) 25 MG tablet   valsartan-hydrochlorothiazide (DIOVAN-HCT) 160-25 MG tablet   Hypertensive heart and renal disease   Relevant Medications   carvedilol (COREG) 25 MG tablet   valsartan-hydrochlorothiazide (DIOVAN-HCT) 160-25 MG tablet     Digestive   Gastroesophageal reflux disease without esophagitis   Relevant Medications   pantoprazole (PROTONIX) 40 MG tablet   meclizine (ANTIVERT) 12.5 MG tablet     Endocrine   Type 2 diabetes mellitus with stage 3b chronic kidney disease, without long-term current use of insulin (HCC) - Primary   Relevant Medications   valsartan-hydrochlorothiazide (DIOVAN-HCT) 160-25 MG tablet   Hypothyroidism   Relevant Medications   levothyroxine (SYNTHROID) 25 MCG tablet   carvedilol (COREG) 25 MG tablet  1.  Type 2 diabetes with CKD stage IIIb-chronic.  Not ideally controlled.  Per patient A1c is 7.  Will get copy of labs from nephrology.  Per patient, nephrologist wanted to start FWilder Glade however financial resources are limited. 2.  Atrial fibrillation-chronic.  Controlled.  Managed by cardiology.  Continue Eliquis 5 mg twice daily (gets patient assistance),  amiodarone 200 mg daily 3.  Coronary artery disease with stable angina-chronic.  Controlled.  Continue atorvastatin 20 mg daily.  Currently on Eliquis for anticoagulation continue isosorbide 20 mg daily 4.  Hypertensive heart disease-chronic.  Controlled outpatient.  Continue carvedilol 25 mg twice daily, hydralazine 50 mg in the a.m. and 25 mg in the p.m. valsartan/HCT 160/25 mg daily. 5.  Hypothyroidism-chronic.  Controlled per patient.  Will get copy of labs from nephrology.  Continue levothyroxine 0.025 mg daily. 6.  GERD-chronic.  Not well controlled the past 2 months.  Stop Pepcid at at bedtime.  Will increase Protonix to 40 mg twice daily.  If not improving in 1 month, let [place].  Follow-up 6 months  Outpatient  Encounter Medications as of 01/29/2022  Medication Sig   ALPRAZolam (XANAX) 0.25 MG tablet Take 0.25 mg by mouth at bedtime as needed for anxiety.   amiodarone (PACERONE) 200 MG tablet Take 1 tablet (200 mg total) by mouth daily.   atorvastatin (LIPITOR) 20 MG tablet Take 20 mg by mouth daily.   Calcium Carb-Cholecalciferol (CALCIUM 600 + D PO) Take 1 tablet by mouth daily.   cholecalciferol (VITAMIN D3) 25 MCG (1000 UNIT) tablet Take 1,000 Units by mouth daily.   Cyanocobalamin (VITAMIN B-12) 5000 MCG TBDP Take 5,000 mcg by mouth daily.   ELIQUIS 5 MG TABS tablet TAKE 1 TABLET BY MOUTH TWICE A DAY   Ferrous Sulfate (IRON PO) Take 2.5 mLs by mouth in the morning and at bedtime. Liquid Iron   fluticasone (FLONASE) 50 MCG/ACT nasal spray Place 1 spray into both nostrils daily as needed for allergies or rhinitis.   folic acid (FOLVITE) 798 MCG tablet Take 400 mcg by mouth daily.   hydrALAZINE (APRESOLINE) 50 MG tablet Take 1 tablet (50 mg total) by mouth 3 (three) times daily.   isosorbide mononitrate (ISMO) 20 MG tablet Take 20 mg by mouth daily.   nitroGLYCERIN (NITROSTAT) 0.4 MG SL tablet Place 0.4 mg under the tongue every 5 (five) minutes as needed for chest pain.    [DISCONTINUED] carvedilol (COREG) 25 MG tablet Take 25 mg by mouth 2 (two) times daily with a meal.   [DISCONTINUED] famotidine (PEPCID) 40 MG tablet Take 40 mg by mouth every evening.   [DISCONTINUED] levothyroxine (SYNTHROID) 25 MCG tablet Take 25 mcg by mouth daily before breakfast.   [DISCONTINUED] meclizine (ANTIVERT) 12.5 MG tablet Take 12.5 mg by mouth 3 (three) times daily as needed for dizziness.   [DISCONTINUED] pantoprazole (PROTONIX) 40 MG tablet TAKE 1 TABLET BY MOUTH EVERY DAY   [DISCONTINUED] Potassium Chloride Crys ER (KLOR-CON M20 PO) Take 20 mEq by mouth daily.   [DISCONTINUED] valsartan-hydrochlorothiazide (DIOVAN-HCT) 160-25 MG tablet Take 1 tablet by mouth daily.   carvedilol (COREG) 25 MG tablet Take 25 mg by mouth 2 (two) times daily with a meal.   carvedilol (COREG) 25 MG tablet Take 1 tablet (25 mg total) by mouth 2 (two) times daily with a meal.   levothyroxine (SYNTHROID) 25 MCG tablet Take 1 tablet (25 mcg total) by mouth daily before breakfast.   meclizine (ANTIVERT) 12.5 MG tablet Take 1 tablet (12.5 mg total) by mouth 3 (three) times daily as needed for dizziness.   pantoprazole (PROTONIX) 40 MG tablet Take 1 tablet (40 mg total) by mouth 2 (two) times daily before a meal.   potassium chloride SA (KLOR-CON M20) 20 MEQ tablet Take 1 tablet (20 mEq total) by mouth daily.   valsartan-hydrochlorothiazide (DIOVAN-HCT) 160-25 MG tablet Take 1 tablet by mouth daily.   [DISCONTINUED] KLOR-CON M20 20 MEQ tablet Take 20 mEq by mouth daily.   [DISCONTINUED] pantoprazole (PROTONIX) 40 MG tablet 1 tablet   [DISCONTINUED] Potassium Chloride ER 20 MEQ TBCR Take 1 tablet by mouth daily.   [DISCONTINUED] traMADol (ULTRAM) 50 MG tablet SMARTSIG:1-2 Tablet(s) By Mouth 2-3 Times Daily PRN (Patient not taking: Reported on 01/29/2022)   No facility-administered encounter medications on file as of 01/29/2022.    Follow-up: Return in about 6 months (around 07/30/2022) for dm, thyroid.    Wellington Hampshire, MD

## 2022-02-12 ENCOUNTER — Telehealth: Payer: Self-pay | Admitting: Cardiovascular Disease

## 2022-02-12 NOTE — Telephone Encounter (Signed)
Patient would like to stop by with forms to have Dr. Audie Box sign so she can fax them for her Eliquis.  She doesn't want them to get lost, so she just want to stop by with them to have them signed. She wants to know when would be the best time for her to do that. Please advise.

## 2022-02-12 NOTE — Telephone Encounter (Signed)
Left detailed message on pt's voicemail (ok per DPR) regarding forms for Dr. Audie Box to sign. Let her know that Dr. Audie Box is out of the office for the next 2 weeks but she can drop off paperwork on Wednesday to his primary nurse. Advised pt to call back with questions or concerns.

## 2022-02-22 NOTE — Progress Notes (Signed)
Cardiology Office Note:   Date:  02/26/2022  NAME:  Destiny Rice    MRN: [id number] DOB:  01-Oct-1945   PCP:  Tawnya Crook, MD  Cardiologist:  Evalina Field, MD  Electrophysiologist:  None   Referring MD: Carol Ada, MD   Chief Complaint  Patient presents with   Follow-up        History of Present Illness:   Destiny Rice is a 76 y.o. female with a hx of paroxysmal atrial fibrillation, mitral valve repair/TAVR, CAD status post PCI, CKD stage IV, hyperlipidemia, diabetes who presents for follow-up.  She presents with her husband.  Blood pressure 208/100.  She did not take her medications this morning.  She takes her blood pressure medication with food but has not had any food this morning.  Blood pressure values per her report seem to be well controlled.  Again, she did not take her medications.  Her kidney function appears to be stable.  We are watching her carotid artery disease.  Most recent echo unremarkable.  No further signs of atrial fibrillation.  She had a chest x-ray this summer.  She reports being diagnosed with cataracts.  I would like for her to see ophthalmology.  She is on chronic amiodarone therapy.  She will need to have this monitored yearly.  She also needs to have her lipids rechecked.  She is on Lipitor.  No signs of congestive heart failure.  She reports she is exercising in a house.  Problem List 1. CAD -prior PCI 2. Cardiac tumor 3. Mitral Valve Repair -26 mm Edwards annuloplasty ring -12/09/2012 4. Severe aortic stenosis s/p 26 mm Evolut Pro -04/24/2018 4. Paroxysmal Afib 5. Carotid Artery Disease s/p L CEA -R ICA 100% 6. HTN 7. DM -A1c 6.9 8. HLD -T chol 215, HDL 104, LDL 92, triglycerides 112 9. 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  Past Medical History: Past Medical History:  Diagnosis Date   AF (paroxysmal atrial fibrillation) ([place]) 03/10/2020   Allergy    Chronic kidney disease, stage 3b ([place]) 03/10/2020   Coronary artery disease    10 stents  -started 2014   Hypertension    Hypothyroidism 03/10/2020   Lipoma 2014   heart   Mixed hyperlipidemia due to type 2 diabetes mellitus ([place]) 03/10/2020   PONV (postoperative nausea and vomiting)    Type 2 diabetes mellitus with stage 3b chronic kidney disease, without long-term current use of insulin ([place]) 03/10/2020    Past Surgical History: Past Surgical History:  Procedure Laterality Date   ABDOMINAL HYSTERECTOMY     and bso   CAROTID ENDARTERECTOMY Left 2021   CORONARY ANGIOPLASTY WITH STENT PLACEMENT     x10   MITRAL VALVE REPAIR  12/09/2012   TRANSCATHETER AORTIC VALVE REPLACEMENT, 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    Current Medications: Current Meds  Medication Sig   ALPRAZolam (XANAX) 0.25 MG tablet Take 0.25 mg by mouth at bedtime as needed for anxiety.   amiodarone (PACERONE) 200 MG tablet Take 1 tablet (200 mg total) by mouth daily.   atorvastatin (LIPITOR) 20 MG tablet Take 20 mg by mouth daily.   Calcium Carb-Cholecalciferol (CALCIUM 600 + D PO) Take 1 tablet by mouth daily.   carvedilol (COREG) 25 MG tablet Take 1 tablet (25 mg total) by mouth 2 (two) times daily with a meal.   cholecalciferol (VITAMIN D3) 25 MCG (1000 UNIT) tablet Take 1,000 Units by mouth daily.   Cyanocobalamin (VITAMIN B-12) 5000 MCG TBDP Take 5,000 mcg by  mouth daily.   ELIQUIS 5 MG TABS tablet TAKE 1 TABLET BY MOUTH TWICE A DAY   Ferrous Sulfate (IRON PO) Take 2.5 mLs by mouth in the morning and at bedtime. Liquid Iron   fluticasone (FLONASE) 50 MCG/ACT nasal spray Place 1 spray into both nostrils daily as needed for allergies or rhinitis.   folic acid (FOLVITE) 169 MCG tablet Take 400 mcg by mouth daily.   hydrALAZINE (APRESOLINE) 50 MG tablet Take 1 tablet (50 mg total) by mouth 3 (three) times daily.   levothyroxine (SYNTHROID) 25 MCG tablet Take 1 tablet (25 mcg total) by mouth daily before breakfast.   meclizine (ANTIVERT) 12.5 MG tablet Take 1 tablet (12.5 mg total) by mouth 3 (three) times  daily as needed for dizziness.   nitroGLYCERIN (NITROSTAT) 0.4 MG SL tablet Place 0.4 mg under the tongue every 5 (five) minutes as needed for chest pain.   pantoprazole (PROTONIX) 40 MG tablet Take 1 tablet (40 mg total) by mouth 2 (two) times daily before a meal.   potassium chloride SA (KLOR-CON M20) 20 MEQ tablet Take 1 tablet (20 mEq total) by mouth daily.   valsartan-hydrochlorothiazide (DIOVAN-HCT) 160-25 MG tablet Take 1 tablet by mouth daily.     Allergies:    Iodine-131 and Sulfa antibiotics   Social History: Social History   Socioeconomic History   Marital status: Married    Spouse name: Not on file   Number of children: 1   Years of education: Not on file   Highest education level: Not on file  Occupational History   Occupation: retired  Tobacco Use   Smoking status: Never   Smokeless tobacco: Never  Vaping Use   Vaping Use: Never used  Substance and Sexual Activity   Alcohol use: Not Currently   Drug use: Never   Sexual activity: Not Currently  Other Topics Concern   Not on file  Social History Narrative   Son   4 grands, greats 12      Retired housekeeping.   Social Determinants of Health   Financial Resource Strain: Not on file  Food Insecurity: Not on file  Transportation Needs: Not on file  Physical Activity: Not on file  Stress: Not on file  Social Connections: Not on file     Family History: The patient's family history includes Alcohol abuse in her father and mother; Hypertension in her mother and sister; Stroke in her maternal grandmother.  ROS:   All other ROS reviewed and negative. Pertinent positives noted in the HPI.     EKGs/Labs/Other Studies Reviewed:   The following studies were personally reviewed by me today:  TTE 05/07/2020  1. Left ventricular ejection fraction, by estimation, is 55 to 60%. Left  ventricular ejection fraction by 3D volume is 58 %. The left ventricle has  normal function. The left ventricle has no regional  wall motion  abnormalities. There is mild concentric  left ventricular hypertrophy. Left ventricular diastolic function could  not be evaluated. The average left ventricular global longitudinal strain  is -16.0 %. The global longitudinal strain is normal.   2. Right ventricular systolic function is normal. The right ventricular  size is mildly enlarged.   3. Left atrial size was mildly dilated.   4. Right atrial size was mildly dilated.   5. S/P mitral valve annuloplasty ring. The mitral valve has been  repaired/replaced. Trivial mitral valve regurgitation. Moderate mitral  stenosis. The mean mitral valve gradient is 8.0 mmHg. There is a  prosthetic annuloplasty ring present in the mitral  position.   6. The aortic valve has been repaired/replaced. Aortic valve  regurgitation is not visualized. No aortic stenosis is present. There is a  26 mm CoreValve-Evolut Pro prosthetic (TAVR) valve present in the aortic  position. Aortic valve area, by VTI measures   1.51 cm. Aortic valve mean gradient measures 11.0 mmHg. Aortic valve  Vmax measures 2.12 m/s. Diminsionless index is 0.5. The AVA is likely  underestimated due to small LVOT.   7. The inferior vena cava is normal in size with greater than 50%  respiratory variability, suggesting right atrial pressure of 3 mmHg.   Recent Labs: 08/22/2021: TSH 3.790   Recent Lipid Panel No results found for: "CHOL", "TRIG", "HDL", "CHOLHDL", "VLDL", "LDLCALC", "LDLDIRECT"  Physical Exam:   VS:  BP (!) 208/100   Pulse 74   Ht '5\' 5"'$  (1.651 m)   Wt 248 lb (112.5 kg)   SpO2 95%   BMI 41.27 kg/m    Wt Readings from Last 3 Encounters:  02/26/22 248 lb (112.5 kg)  01/29/22 251 lb (113.9 kg)  11/02/21 252 lb 6.4 oz (114.5 kg)    General: Well nourished, well developed, in no acute distress Head: Atraumatic, normal size  Eyes: PEERLA, EOMI  Neck: Supple, no JVD Endocrine: No thryomegaly Cardiac: Normal S1, S2; RRR; no murmurs, rubs, or  gallops Lungs: Clear to auscultation bilaterally, no wheezing, rhonchi or rales  Abd: Soft, nontender, no hepatomegaly  Ext: No edema, pulses 2+ Musculoskeletal: No deformities, BUE and BLE strength normal and equal Skin: Warm and dry, no rashes   Neuro: Alert and oriented to person, place, time, and situation, CNII-XII grossly intact, no focal deficits  Psych: Normal mood and affect   ASSESSMENT:   SHAMONE WINZER is a 76 y.o. female who presents for the following: 1. Paroxysmal atrial fibrillation (HCC)   2. Acquired thrombophilia ([place])   3. Coronary artery disease involving native coronary artery of native heart without angina pectoris   4. Mixed hyperlipidemia   5. S/P TAVR (transcatheter aortic valve replacement)   6. S/P mitral valve repair   7. Primary hypertension   8. Carotid artery disease, unspecified laterality, unspecified type ([place])   9. Cataract of both eyes, unspecified cataract type     PLAN:   1. Paroxysmal atrial fibrillation (HCC) 2. Acquired thrombophilia ([place]) -Standing history of atrial fibrillation.  She has been on amiodarone for years.  Continue 200 mg daily.  She has not wanted to wean this dose.  She is also on Eliquis 5 mg twice daily.  We filled out financial assistance forms with her today. -Chest x-ray earlier this summer normal. -Thyroid studies this year normal. -She does report cataracts being diagnosed by an optometrist.  I would like her to see an ophthalmologist for an eye exam.  I would like to make sure there is no sign of amiodarone toxicity.  Otherwise she is stable on amiodarone.  3. Coronary artery disease involving native coronary artery of native heart without angina pectoris 4. Mixed hyperlipidemia -History of prior PCI.  Not on aspirin due to Eliquis therapy.  She is on Lipitor 20 mg daily.  Recheck lipids today.  5. S/P TAVR (transcatheter aortic valve replacement) 6. S/P mitral valve repair -History of mitral valve repair as well as  TAVR.  Most recent echo shows stable gradients.  Not on aspirin as she is on Eliquis.  She understands she needs antibiotics before dental work.  7. Primary hypertension -She did not take her medications today.  Instructed to continue to take her medications as prescribed and to keep a log.  BP values are well controlled per her report.  8. Carotid artery disease, unspecified laterality, unspecified type ([place]) -Right ICA 100% stenosis.  No disease in the left.  Continues yearly exam.  9. Cataract of both eyes, unspecified cataract type -Yearly follow-up with ophthalmology recommended.  She reports that she was diagnosed with this by a optometrist.  Plan to have her see ophthalmology given amiodarone use.  Disposition: Return in about 6 months (around 08/28/2022).  Medication Adjustments/Labs and Tests Ordered: Current medicines are reviewed at length with the patient today.  Concerns regarding medicines are outlined above.  Orders Placed This Encounter  Procedures   Lipid panel   Ambulatory referral to Ophthalmology   No orders of the defined types were placed in this encounter.   Patient Instructions  Medication Instructions:  The current medical regimen is effective;  continue present plan and medications.  *If you need a refill on your cardiac medications before your next appointment, please call your pharmacy*   Lab Work: LIPID today   If you have labs (blood work) drawn today and your tests are completely normal, you will receive your results only by: [place] (if you have MyChart) OR A paper copy in the mail If you have any lab test that is abnormal or we need to change your treatment, we will call you to review the results.   Follow-Up: At [facility], you and your health needs are our priority.  As part of our continuing mission to provide you with exceptional heart care, we have created designated Provider Care Teams.  These Care Teams include your  primary Cardiologist (physician) and Advanced Practice Providers (APPs -  Physician Assistants and Nurse Practitioners) who all work together to provide you with the care you need, when you need it.  We recommend signing up for the patient portal called "MyChart".  Sign up information is provided on this After Visit Summary.  MyChart is used to connect with patients for Virtual Visits (Telemedicine).  Patients are able to view lab/test results, encounter notes, upcoming appointments, etc.  Non-urgent messages can be sent to your provider as well.   To learn more about what you can do with MyChart, go to [email or web address].    Your next appointment:   6 month(s)  The format for your next appointment:   In Person  Provider:   Evalina Field, MD       We have sent a referral to [facility] - they will cal you for an appointment.      Time Spent with Patient: I have spent a total of 25 minutes with patient reviewing hospital notes, telemetry, EKGs, labs and examining the patient as well as establishing an assessment and plan that was discussed with the patient.  > 50% of time was spent in direct patient care.  Signed, Addison Naegeli. Audie Box, MD, [place]  [address], [place], [place] [address] [phone]  02/26/2022 4:55 PM

## 2022-02-26 ENCOUNTER — Ambulatory Visit: Payer: Medicare Other | Attending: Cardiovascular Disease | Admitting: Cardiovascular Disease

## 2022-02-26 ENCOUNTER — Encounter: Payer: Self-pay | Admitting: Cardiovascular Disease

## 2022-02-26 VITALS — BP 208/100 | HR 74 | Ht 65.0 in | Wt 248.0 lb

## 2022-02-26 DIAGNOSIS — I2510 Atherosclerotic heart disease of native coronary artery without angina pectoris: Secondary | ICD-10-CM | POA: Diagnosis not present

## 2022-02-26 DIAGNOSIS — Z952 Presence of prosthetic heart valve: Secondary | ICD-10-CM

## 2022-02-26 DIAGNOSIS — E782 Mixed hyperlipidemia: Secondary | ICD-10-CM | POA: Diagnosis not present

## 2022-02-26 DIAGNOSIS — D6869 Other thrombophilia: Secondary | ICD-10-CM

## 2022-02-26 DIAGNOSIS — Z9889 Other specified postprocedural states: Secondary | ICD-10-CM | POA: Diagnosis not present

## 2022-02-26 DIAGNOSIS — I779 Disorder of arteries and arterioles, unspecified: Secondary | ICD-10-CM

## 2022-02-26 DIAGNOSIS — H269 Unspecified cataract: Secondary | ICD-10-CM | POA: Diagnosis not present

## 2022-02-26 DIAGNOSIS — I480 Paroxysmal atrial fibrillation: Secondary | ICD-10-CM

## 2022-02-26 DIAGNOSIS — I10 Essential (primary) hypertension: Secondary | ICD-10-CM

## 2022-02-26 NOTE — Patient Instructions (Addendum)
Medication Instructions:  The current medical regimen is effective;  continue present plan and medications.  *If you need a refill on your cardiac medications before your next appointment, please call your pharmacy*   Lab Work: LIPID today   If you have labs (blood work) drawn today and your tests are completely normal, you will receive your results only by: [place] (if you have MyChart) OR A paper copy in the mail If you have any lab test that is abnormal or we need to change your treatment, we will call you to review the results.   Follow-Up: At [facility], you and your health needs are our priority.  As part of our continuing mission to provide you with exceptional heart care, we have created designated Provider Care Teams.  These Care Teams include your primary Cardiologist (physician) and Advanced Practice Providers (APPs -  Physician Assistants and Nurse Practitioners) who all work together to provide you with the care you need, when you need it.  We recommend signing up for the patient portal called "MyChart".  Sign up information is provided on this After Visit Summary.  MyChart is used to connect with patients for Virtual Visits (Telemedicine).  Patients are able to view lab/test results, encounter notes, upcoming appointments, etc.  Non-urgent messages can be sent to your provider as well.   To learn more about what you can do with MyChart, go to [email or web address].    Your next appointment:   6 month(s)  The format for your next appointment:   In Person  Provider:   Evalina Field, MD       We have sent a referral to [facility] - they will cal you for an appointment.

## 2022-02-27 ENCOUNTER — Encounter: Payer: Self-pay | Admitting: *Deleted

## 2022-02-27 LAB — LIPID PANEL
Chol/HDL Ratio: 2 ratio (ref 0.0–4.4)
Cholesterol, Total: 208 mg/dL — ABNORMAL HIGH (ref 100–199)
HDL: 105 mg/dL (ref >39)
LDL Chol Calc (NIH): 91 mg/dL (ref 0–99)
Triglycerides: 66 mg/dL (ref 0–149)
VLDL Cholesterol Cal: 12 mg/dL (ref 5–40)

## 2022-03-23 ENCOUNTER — Telehealth: Payer: Self-pay | Admitting: Family Medicine

## 2022-03-23 ENCOUNTER — Telehealth: Payer: Self-pay | Admitting: Cardiovascular Disease

## 2022-03-23 NOTE — Telephone Encounter (Signed)
Patient states on 03/22/22 Patient's blood pressure was running 126/40 Heart Rate-62 in the morning, 115/52 -Heart Rate 61 evening.  Today blood pressure is 121/49 - Heart Rate 63.  Patient states she has been having chills and nausea for approx. 1 month.that has progressed  Transferred to Triage Janett Billow).

## 2022-03-23 NOTE — Telephone Encounter (Signed)
Please see message below and advise.

## 2022-03-23 NOTE — Telephone Encounter (Signed)
Patient stated she is concerned about her DBP: 122/45; 132/44, 130/56; 126/40, P 62; 115/52; 121/49, P 63. While on phone 142/75  Patient is taking: Amiodarone '200mg'$  daily Carvedilol '25mg'$  twice daily Eliquis '5mg'$  twice daily Imdur '20mg'$  daily Diovan 160-25 daily Hydralazine '50mg'$  in the AM and '25mg'$  in the PM  Patient reports having chills for several months, no fever Reports headache and lightheadedness. She uses a walker  She drinks 46 oz water per day  Dr. Gwenlyn Found (DOD) made aware. No new orders. Patient advised to continue with medications.

## 2022-03-23 NOTE — Telephone Encounter (Signed)
  Pt c/o BP issue: STAT if pt c/o blurred vision, one-sided weakness or slurred speech  1. What are your last 5 BP readings?    12/25: 122/45 12/26: 132/44  12/27: 130/56  12/28 morning - 126/40 hr 62           evening : 115/52 hr61 12/29 morning: 121/49  hr63   2. Are you having any other symptoms (ex. Dizziness, headache, blurred vision, passed out)? Headaches, she states yesterday it was bad but she didn't take anything for it. She states she just lays down and it will go away.   3. What is your BP issue? Pt states she spoke to her PCP about her bp this morning and they suggested she ask Dr. Audie Box

## 2022-03-27 ENCOUNTER — Telehealth: Payer: Self-pay | Admitting: Family Medicine

## 2022-03-27 NOTE — Telephone Encounter (Signed)
Patient notified of message below. Patient stated that she haven't had a headache since Friday. Patient has an appointment on 03/29/22 and will bring both of her cuffs to make sure they are working correctly. Patient stated that the HR and top number have been okay, she was just concerned with the bottom number being in the 40's sometimes.

## 2022-03-27 NOTE — Telephone Encounter (Signed)
Apt with dr Cherlynn Kaiser on 03/29/22   Patient Name: Destiny Rice Gender: Female DOB: 08-14-1945 Age: 77 Y 11 M 8 D Return Phone Number: [phone] (Primary), [phone] (Secondary) Address: City/ State/ Zip: [place]  [address] Client [facility] Client Site [facility] [place] [place] Contact Type Call Who Is Calling Patient / Member / Family / Caregiver Call Type Triage / Clinical Relationship To Patient Self Return Phone Number [phone] (Primary) Chief Complaint Dizziness Reason for Call Symptomatic / Request for [place] states she has high blood pressure of 121/49 with heartrate of 63 and she has been having nausea and chills for a month now that has been progressing. Dr. Cherlynn Kaiser is not listed but office location was confirmed. She also states she is dizzy. Translation No Nurse Assessment Nurse: Patsey Berthold, RN, Roma Kayser Date/Time Eilene Ghazi Time): 03/23/2022 11:29:33 AM Confirm and document reason for call. If symptomatic, describe symptoms. ---Caller states she has high blood pressure of 121/49 with heartrate of 63 and she has been having nausea and chills for a month now that has been progressing. Dr. Cherlynn Kaiser is not listed but office location was confirmed. She also states she is dizzy. BPs have been in the 150-130s/ 50-40s lately this past week. Does the patient have any new or worsening symptoms? ---Yes Will a triage be completed? ---Yes Related visit to physician within the last 2 weeks? ---No Does the PT have any chronic conditions? (i.e. diabetes, asthma, this includes High risk factors for pregnancy, etc.) ---Yes List chronic conditions. ---HTN Is this a behavioral health or substance abuse call? ---No Guidelines Guideline Title Affirmed Question Affirmed Notes Nurse Date/Time (Eastern Time) Blood Pressure - Low Diastolic BP < 50 mm Hg Patsey Berthold,  RN, Roma Kayser 03/23/2022 11:32:17 AM PLEASE NOTE: All timestamps contained within this report are represented as [place] Standard Time. CONFIDENTIALTY NOTICE: This fax transmission is intended only for the addressee. It contains information that is legally privileged, confidential or otherwise protected from use or disclosure. If you are not the intended recipient, you are strictly prohibited from reviewing, disclosing, copying using or disseminating any of this information or taking any action in reliance on or regarding this information. If you have received this fax in error, please notify us immediately by telephone so that we can arrange for its return to [place]. Phone: [phone], Toll-Free: [phone], Fax: [phone] Page: 2 of 2 Call Id: [id number] [place]. Time Eilene Ghazi Time) Disposition Final User 03/23/2022 11:52:55 AM SEE PCP WITHIN 3 DAYS Yes Patsey Berthold RN, Roma Kayser Final Disposition 03/23/2022 11:52:55 AM SEE PCP WITHIN 3 DAYS Yes Patsey Berthold, RN, Melvyn Novas Disagree/Comply Comply Caller Understands Yes PreDisposition Call Doctor Care Advice Given Per Guideline SEE PCP WITHIN 3 DAYS: * You need to be seen within 2 or 3 days. CALL BACK IF: * Lightheadedness, weakness, or dizziness occurs * You feel sick * You become worse CARE ADVICE given per Low Blood Pressure (Adult) guideline. Comments User: Diana Eves, RN Date/Time Eilene Ghazi Time): 03/23/2022 11:32:15 AM Dizziness and nausea, come and go, blood pressures have been dropping everyday in the evening. User: Diana Eves, RN Date/Time Eilene Ghazi Time): 03/23/2022 11:35:57 AM carvedilol '25mg'$  BID User: Diana Eves, RN Date/Time Eilene Ghazi Time): 03/23/2022 11:37:29 AM hydroxyzine, User: Diana Eves, RN Date/Time Eilene Ghazi Time): 03/23/2022 11:38:10 AM amiodarone User: Diana Eves, RN Date/Time Eilene Ghazi Time): 03/23/2022 11:45:32 AM Has instructions from cardiology to titrate one of medications to where  she takes '50mg'$  and '25mg'$  in  evening. User: Diana Eves, RN Date/Time Eilene Ghazi Time): 03/23/2022 11:48:31 AM Caller states she has appointment on Thursday with PCP. No appointment yet with cardiology. User: Diana Eves, RN Date/Time Eilene Ghazi Time): 03/23/2022 11:52:53 AM Caller will try to follow up with cardiology, states that she was given instructions from cardiology on how to titrate her medications due to symptoms. She will try to reduce intake on one of her medications to see if that helps resolve symptoms and refer to cardiology. Explains she has PCP appointment with office outside of time frame will refer back for any worsening symptoms. Referrals REFERRED TO PCP OFFIC

## 2022-03-27 NOTE — Telephone Encounter (Signed)
Patient declined feeling dizzy and stated that she has been drinking more water since last week.

## 2022-03-27 NOTE — Telephone Encounter (Signed)
Contacted patient, advised of message below- patient is scheduled to see PCP on 01/04- she will keep this appointment, but scheduled her to see Dr.[name] on 01/26.  Patient thankful for call back.

## 2022-03-27 NOTE — Telephone Encounter (Signed)
Patient notified of message below. Patient stated that she haven't had a headache since Friday. Patient has an appointment on 03/29/22 and will bring both of her cuffs to make sure they are working correctly. Patient stated that the HR and top number have been okay, she was just concerned with the bottom number being in the 40's sometimes.       Note

## 2022-03-27 NOTE — Telephone Encounter (Signed)
Patient Name: Destiny Rice Gender: Female DOB: [birth year] Age: 77 Y 11 M 8 D Return Phone Number: [phone] (Primary), [phone] (Secondary) Address: City/ State/ Zip: [place]  [address] Client [place] [facility] [place] Client Site [place] [facility] [place] [facility] Contact Type Call Who Is Calling Patient / Member / Family / Caregiver Call Type Triage / Clinical Relationship To Patient Self Return Phone Number [phone] (Primary) Chief Complaint Dizziness Reason for Call Symptomatic / Request for Zeba states she has high blood pressure of 121/49 with heartrate of 63 and she has been having nausea and chills for a month now that has been progressing. Dr. Cherlynn Kaiser is not listed but office location was confirmed. She also states she is dizzy. Translation No Nurse Assessment Nurse: Patsey Berthold, RN, Roma Kayser Date/Time Eilene Ghazi Time): 03/23/2022 11:29:33 AM Confirm and document reason for call. If symptomatic, describe symptoms. ---Caller states she has high blood pressure of 121/49 with heartrate of 63 and she has been having nausea and chills for a month now that has been progressing. Dr. Cherlynn Kaiser is not listed but office location was confirmed. She also states she is dizzy. BPs have been in the 150-130s/ 50-40s lately this past week. Does the patient have any new or worsening symptoms? ---Yes Will a triage be completed? ---Yes Related visit to physician within the last 2 weeks? ---No Does the PT have any chronic conditions? (i.e. diabetes, asthma, this includes High risk factors for pregnancy, etc.) ---Yes List chronic conditions. ---HTN Is this a behavioral health or substance abuse call? ---No Guidelines Guideline Title Affirmed Question Affirmed Notes Nurse Date/Time (Eastern Time) Blood Pressure - Low Diastolic BP < 50 mm Hg Patsey Berthold, RN, Roma Kayser 03/23/2022 11:32:17 AM PLEASE  NOTE: All timestamps contained within this report are represented as [place] Standard Time. CONFIDENTIALTY NOTICE: This fax transmission is intended only for the addressee. It contains information that is legally privileged, confidential or otherwise protected from use or disclosure. If you are not the intended recipient, you are strictly prohibited from reviewing, disclosing, copying using or disseminating any of this information or taking any action in reliance on or regarding this information. If you have received this fax in error, please notify us immediately by telephone so that we can arrange for its return to [place]. Phone: [phone], Toll-Free: [phone], Fax: [phone] Page: 2 of 2 Call Id: [id number] [place]. Time Eilene Ghazi Time) Disposition Final User 03/23/2022 11:52:55 AM SEE PCP WITHIN 3 DAYS Yes Patsey Berthold RN, Roma Kayser Final Disposition 03/23/2022 11:52:55 AM SEE PCP WITHIN 3 DAYS Yes Patsey Berthold, RN, Melvyn Novas Disagree/Comply Comply Caller Understands Yes PreDisposition Call Doctor Care Advice Given Per Guideline SEE PCP WITHIN 3 DAYS: * You need to be seen within 2 or 3 days. CALL BACK IF: * Lightheadedness, weakness, or dizziness occurs * You feel sick * You become worse CARE ADVICE given per Low Blood Pressure (Adult) guideline. Comments User: Diana Eves, RN Date/Time Eilene Ghazi Time): 03/23/2022 11:32:15 AM Dizziness and nausea, come and go, blood pressures have been dropping everyday in the evening. User: Diana Eves, RN Date/Time Eilene Ghazi Time): 03/23/2022 11:35:57 AM carvedilol '25mg'$  BID User: Diana Eves, RN Date/Time Eilene Ghazi Time): 03/23/2022 11:37:29 AM hydroxyzine, User: Diana Eves, RN Date/Time Eilene Ghazi Time): 03/23/2022 11:38:10 AM amiodarone User: Diana Eves, RN Date/Time Eilene Ghazi Time): 03/23/2022 11:45:32 AM Has instructions from cardiology to titrate one of medications to where she takes '50mg'$  and '25mg'$  in evening. User:  Diana Eves, RN Date/Time (  Eastern Time): 03/23/2022 11:48:31 AM Caller states she has appointment on Thursday with PCP. No appointment yet with cardiology. User: Diana Eves, RN Date/Time Eilene Ghazi Time): 03/23/2022 11:52:53 AM Caller will try to follow up with cardiology, states that she was given instructions from cardiology on how to titrate her medications due to symptoms. She will try to reduce intake on one of her medications to see if that helps resolve symptoms and refer to cardiology. Explains she has PCP appointment with office outside of time frame will refer back for any worsening symptoms. Referrals REFERRED TO PCP OFFIC

## 2022-03-29 ENCOUNTER — Encounter: Payer: Self-pay | Admitting: Family Medicine

## 2022-03-29 ENCOUNTER — Ambulatory Visit (INDEPENDENT_AMBULATORY_CARE_PROVIDER_SITE_OTHER): Payer: Medicare Other | Admitting: Family Medicine

## 2022-03-29 VITALS — BP 160/70 | HR 65 | Temp 97.8°F | Ht 65.0 in | Wt 252.4 lb

## 2022-03-29 DIAGNOSIS — R110 Nausea: Secondary | ICD-10-CM | POA: Diagnosis not present

## 2022-03-29 DIAGNOSIS — K219 Gastro-esophageal reflux disease without esophagitis: Secondary | ICD-10-CM

## 2022-03-29 DIAGNOSIS — N1832 Chronic kidney disease, stage 3b: Secondary | ICD-10-CM

## 2022-03-29 DIAGNOSIS — I1310 Hypertensive heart and chronic kidney disease without heart failure, with stage 1 through stage 4 chronic kidney disease, or unspecified chronic kidney disease: Secondary | ICD-10-CM

## 2022-03-29 DIAGNOSIS — E1122 Type 2 diabetes mellitus with diabetic chronic kidney disease: Secondary | ICD-10-CM

## 2022-03-29 NOTE — Progress Notes (Signed)
Subjective:     Patient ID: Destiny Rice, female    DOB: [birth year], 77 y.o.   MRN: [id number]  Chief Complaint  Patient presents with   Blood Pressure Issue    Blood pressure has been fluctuating, elevated at times and then low at times Has been taking medication as directed    HPI-here w/husb  HTN-Pt is on coreg 71m bid and diovan hct 160/25 and hydralazine 572mam and 2542m pm  Bp's running 142/75, 109/46, 114/51, 91/43.  HR 57-62 .    Also has a fib-on amiodorone.  Checked pt wrist cuff and same as ours  GERD-a lot of  belching, no heartburn, no vomit.  +gas below as well.. On pantoprazole 63m73md.  Belching can wake her up.   Cough intermitt for 1-2 days.  Gone.   Bad chills for long time. At times, really hot and turns on fan-not sweats.  Lasts 5-10mi74mout once/day about 5pm and bp decreasing then and eats and OJ. Doesn't eat lunch. Breakfast egg whites and roll.  Vertigo intermitt-was bad in Nov, dec about once/wk.  Has been hosp in 2021 for it.  In general "just not feel good".  Some is nausea, some just not feel good. Neph dr. SingeLoel Dubonnet/8  Health Maintenance Due  Topic Date Due   HEMOGLOBIN A1C  Never done   OPHTHALMOLOGY EXAM  Never done   Diabetic kidney evaluation - Urine ACR  Never done   Hepatitis C Screening  Never done   Zoster Vaccines- Shingrix (1 of 2) Never done   DEXA SCAN  Never done   Diabetic kidney evaluation - eGFR measurement  03/11/2021   Medicare Annual Wellness (AWV)  12/12/2021    Past Medical History:  Diagnosis Date   AF (paroxysmal atrial fibrillation) (HCC) [place]16/2021   Allergy    Chronic kidney disease, stage 3b (HCC) [place]16/2021   Coronary artery disease    10 stents -started 2014   Hypertension    Hypothyroidism 03/10/2020   Lipoma 2014   heart   Mixed hyperlipidemia due to type 2 diabetes mellitus (HCC) [place]16/2021   PONV (postoperative nausea and vomiting)    Type 2 diabetes mellitus with stage 3b chronic kidney  disease, without long-term current use of insulin (HCC) [place]16/2021    Past Surgical History:  Procedure Laterality Date   ABDOMINAL HYSTERECTOMY     and bso   CAROTID ENDARTERECTOMY Left 2021   CORONARY ANGIOPLASTY WITH STENT PLACEMENT     x10   MITRAL VALVE REPAIR  12/09/2012   TRANSCATHETER AORTIC VALVE REPLACEMENT, 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    Outpatient Medications Prior to Visit  Medication Sig Dispense Refill   ALPRAZolam (XANAX) 0.25 MG tablet Take 0.25 mg by mouth at bedtime as needed for anxiety.     amiodarone (PACERONE) 200 MG tablet Take 1 tablet (200 mg total) by mouth daily. 30 tablet 10   atorvastatin (LIPITOR) 20 MG tablet Take 20 mg by mouth daily.     Calcium Carb-Cholecalciferol (CALCIUM 600 + D PO) Take 1 tablet by mouth daily.     carvedilol (COREG) 25 MG tablet Take 25 mg by mouth 2 (two) times daily with a meal.     carvedilol (COREG) 25 MG tablet Take 1 tablet (25 mg total) by mouth 2 (two) times daily with a meal. 180 tablet 3   cholecalciferol (VITAMIN D3) 25 MCG (1000 UNIT) tablet Take 1,000 Units by mouth daily.     Cyanocobalamin (VITAMIN  B-12) 5000 MCG TBDP Take 5,000 mcg by mouth daily.     ELIQUIS 5 MG TABS tablet TAKE 1 TABLET BY MOUTH TWICE A DAY 60 tablet 5   Ferrous Sulfate (IRON PO) Take 2.5 mLs by mouth in the morning and at bedtime. Liquid Iron     fluticasone (FLONASE) 50 MCG/ACT nasal spray Place 1 spray into both nostrils daily as needed for allergies or rhinitis.     folic acid (FOLVITE) 010 MCG tablet Take 400 mcg by mouth daily.     hydrALAZINE (APRESOLINE) 50 MG tablet Take 1 tablet (50 mg total) by mouth 3 (three) times daily. 270 tablet 3   levothyroxine (SYNTHROID) 25 MCG tablet Take 1 tablet (25 mcg total) by mouth daily before breakfast. 90 tablet 3   meclizine (ANTIVERT) 12.5 MG tablet Take 1 tablet (12.5 mg total) by mouth 3 (three) times daily as needed for dizziness. 180 tablet 1   nitroGLYCERIN (NITROSTAT) 0.4 MG SL tablet Place  0.4 mg under the tongue every 5 (five) minutes as needed for chest pain.     pantoprazole (PROTONIX) 40 MG tablet Take 1 tablet (40 mg total) by mouth 2 (two) times daily before a meal. 180 tablet 3   potassium chloride SA (KLOR-CON M20) 20 MEQ tablet Take 1 tablet (20 mEq total) by mouth daily. 90 tablet 3   valsartan-hydrochlorothiazide (DIOVAN-HCT) 160-25 MG tablet Take 1 tablet by mouth daily. 90 tablet 3   isosorbide mononitrate (ISMO) 20 MG tablet Take 20 mg by mouth daily.     No facility-administered medications prior to visit.    Allergies  Allergen Reactions   Iodine-131 Nausea Only   Sulfa Antibiotics Hives   ROS neg/noncontributory except as noted HPI/below      Objective:     BP (!) 160/70 (BP Location: Left Arm, Patient Position: Sitting, Cuff Size: Large)   Pulse 65   Temp 97.8 F (36.6 C) (Temporal)   Ht _0  (1.651 m)   Wt 252 lb 6 oz (114.5 kg)   SpO2 96%   BMI 42.00 kg/m  Wt Readings from Last 3 Encounters:  03/29/22 252 lb 6 oz (114.5 kg)  02/26/22 248 lb (112.5 kg)  01/29/22 251 lb (113.9 kg)    Physical Exam   Gen: WDWN NAD  belching HEENT: NCAT, conjunctiva not injected, sclera nonicteric NECK:  supple, no thyromegaly, no nodes, +B carotid bruits vs murmur CARDIAC: brady RRR, S1S2+, 2/6 murmur. DP 1+B LUNGS: CTAB. No wheezes ABDOMEN:  BS+, soft, NTND, No HSM, no masses EXT:  no edema MSK: no gross abnormalities.  NEURO: A&O x3.  CN II-XII intact.  PSYCH: normal mood. Good eye contact  Diabetic Foot Exam - Simple   Simple Foot Form Diabetic Foot exam was performed with the following findings: Yes 03/29/2022 12:10 PM  Visual Inspection See comments: Yes Sensation Testing Intact to touch and monofilament testing bilaterally: Yes Pulse Check Posterior Tibialis and Dorsalis pulse intact bilaterally: Yes Comments Thickened toenails.  Sees pod         Assessment & Plan:   Problem List Items Addressed This Visit       Cardiovascular  and Mediastinum   Hypertensive heart and renal disease     Digestive   Gastroesophageal reflux disease without esophagitis - Primary   Relevant Orders   Ambulatory referral to Gastroenterology     Endocrine   Type 2 diabetes mellitus with stage 3b chronic kidney disease, without long-term current use of insulin ([place])  Other Visit Diagnoses     Nausea       Relevant Orders   Ambulatory referral to Gastroenterology      HTN-chronic.  Well controlled at home.  In fact, running low lately w/diastolics 72-62 and HR 03-55.  Not feeling well.  Checked pt cuff and ok.  She sees Card on 1/26.  Will continue meds as is but decrease carvedilol to 12.73m bid rather than 282mbid.  Monitor bp and pulse.   GERD/belching-no change w/bid protonix.  Refer GI Chronic nausea-refer GI DM type 2-pt only eating bid and having what sounds like hypoglycemic events around 5pm.  Advised to eat snack at 3:30pm and see if improves Spent 504mw/pt and husb reviewing bp's, discussing etiology, plan, etc.   No orders of the defined types were placed in this encounter.   Ann[place]ellington HampshireD

## 2022-03-29 NOTE — Patient Instructions (Addendum)
Have a snack at 3:30pm  Decrease the carvedilol to 1/2 tab twice/day-monitor blood pressures and pulse

## 2022-04-04 ENCOUNTER — Telehealth: Payer: Self-pay | Admitting: Family Medicine

## 2022-04-04 NOTE — Telephone Encounter (Signed)
Patient requests to be called when Patients records are received from Gastroenterology office (records request so that Patient can be scheduled at [facility] who is waiting for records per Referral to [facility]) before they will schedule Patient.

## 2022-04-06 DIAGNOSIS — N1832 Chronic kidney disease, stage 3b: Secondary | ICD-10-CM | POA: Diagnosis not present

## 2022-04-06 DIAGNOSIS — E1151 Type 2 diabetes mellitus with diabetic peripheral angiopathy without gangrene: Secondary | ICD-10-CM | POA: Diagnosis not present

## 2022-04-06 DIAGNOSIS — I739 Peripheral vascular disease, unspecified: Secondary | ICD-10-CM | POA: Diagnosis not present

## 2022-04-06 DIAGNOSIS — L84 Corns and callosities: Secondary | ICD-10-CM | POA: Diagnosis not present

## 2022-04-06 DIAGNOSIS — E1142 Type 2 diabetes mellitus with diabetic polyneuropathy: Secondary | ICD-10-CM | POA: Diagnosis not present

## 2022-04-06 DIAGNOSIS — L603 Nail dystrophy: Secondary | ICD-10-CM | POA: Diagnosis not present

## 2022-04-19 NOTE — Progress Notes (Signed)
Cardiology Office Note:   Date:  04/20/2022  NAME:  Destiny Rice    MRN: [id number] DOB:  [birth year]   PCP:  Tawnya Crook, MD  Cardiologist:  Evalina Field, MD  Electrophysiologist:  None   Referring MD: Tawnya Crook, MD   Chief Complaint  Patient presents with   Follow-up        History of Present Illness:   Destiny Rice is a 77 y.o. female with a hx of HTN, TAVR, MV repair, pAF, HLD, DM, CKD who presents for follow-up.  She reports for the past 1 to 2 months she is more short of breath with activity.  No weight gain.  No lower extremity edema.  Her blood pressure is elevated today but value seems to be controlled at home.  She is diabetic but on no medication.  Denies any chest pain or pressure.  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  She reports having prior heart surgery.  She also needs follow-up carotid ultrasounds later this year.  She does need a repeat echocardiogram.  She has a systolic murmur on examination.  This is new.  She has been stable otherwise.  Problem List 1. CAD -prior PCI 2. Cardiac tumor 3. Mitral Valve Repair -26 mm Edwards annuloplasty ring -12/09/2012 4. Severe aortic stenosis s/p 26 mm Evolut Pro -04/24/2018 4. Paroxysmal Afib 5. Carotid Artery Disease s/p L CEA -R ICA 100% 6. HTN 7. DM -A1c 7.0 8. HLD -T chol 208, HDL 105, LDL 91, TG 66 9. 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  Past Medical History: Past Medical History:  Diagnosis Date   AF (paroxysmal atrial fibrillation) ([place]) 03/10/2020   Allergy    Chronic kidney disease, stage 3b ([place]) 03/10/2020   Coronary artery disease    10 stents -started 2014   Hypertension    Hypothyroidism 03/10/2020   Lipoma 2014   heart   Mixed hyperlipidemia due to type 2 diabetes mellitus ([place]) 03/10/2020   PONV (postoperative nausea and vomiting)    Type 2 diabetes mellitus with stage 3b chronic kidney disease, without long-term current use of insulin ([place])  03/10/2020    Past Surgical History: Past Surgical History:  Procedure Laterality Date   ABDOMINAL HYSTERECTOMY     and bso   CAROTID ENDARTERECTOMY Left 2021   CORONARY ANGIOPLASTY WITH STENT PLACEMENT     x10   MITRAL VALVE REPAIR  12/09/2012   TRANSCATHETER AORTIC VALVE REPLACEMENT, 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    Current Medications: Current Meds  Medication Sig   ALPRAZolam (XANAX) 0.25 MG tablet Take 0.25 mg by mouth at bedtime as needed for anxiety.   amiodarone (PACERONE) 200 MG tablet Take 1 tablet (200 mg total) by mouth daily.   atorvastatin (LIPITOR) 20 MG tablet Take 20 mg by mouth daily.   Calcium Carb-Cholecalciferol (CALCIUM 600 + D PO) Take 1 tablet by mouth daily.   carvedilol (COREG) 25 MG tablet Take 25 mg by mouth 2 (two) times daily with a meal.   cholecalciferol (VITAMIN D3) 25 MCG (1000 UNIT) tablet Take 1,000 Units by mouth daily.   Cyanocobalamin (VITAMIN B-12) 5000 MCG TBDP Take 5,000 mcg by mouth daily.   ELIQUIS 5 MG TABS tablet TAKE 1 TABLET BY MOUTH TWICE A DAY   Ferrous Sulfate (IRON PO) Take 2.5 mLs by mouth in the morning and at bedtime. Liquid Iron   fluticasone (FLONASE) 50 MCG/ACT nasal spray Place 1 spray into  both nostrils daily as needed for allergies or rhinitis.   folic acid (FOLVITE) 678 MCG tablet Take 400 mcg by mouth daily.   hydrALAZINE (APRESOLINE) 50 MG tablet Take 1 tablet (50 mg total) by mouth 3 (three) times daily.   levothyroxine (SYNTHROID) 25 MCG tablet Take 1 tablet (25 mcg total) by mouth daily before breakfast.   meclizine (ANTIVERT) 12.5 MG tablet Take 1 tablet (12.5 mg total) by mouth 3 (three) times daily as needed for dizziness.   nitroGLYCERIN (NITROSTAT) 0.4 MG SL tablet Place 0.4 mg under the tongue every 5 (five) minutes as needed for chest pain.   pantoprazole (PROTONIX) 40 MG tablet Take 1 tablet (40 mg total) by mouth 2 (two) times daily before a meal.   potassium chloride SA (KLOR-CON M20) 20 MEQ tablet Take 1 tablet  (20 mEq total) by mouth daily.   valsartan-hydrochlorothiazide (DIOVAN-HCT) 160-25 MG tablet Take 1 tablet by mouth daily.     Allergies:    Iodine-131 and Sulfa antibiotics   Social History: Social History   Socioeconomic History   Marital status: Married    Spouse name: Not on file   Number of children: 1   Years of education: Not on file   Highest education level: Not on file  Occupational History   Occupation: retired  Tobacco Use   Smoking status: Never   Smokeless tobacco: Never  Vaping Use   Vaping Use: Never used  Substance and Sexual Activity   Alcohol use: Not Currently   Drug use: Never   Sexual activity: Not Currently  Other Topics Concern   Not on file  Social History Narrative   Son   4 grands, greats 12      Retired housekeeping.   Social Determinants of Health   Financial Resource Strain: Not on file  Food Insecurity: Not on file  Transportation Needs: Not on file  Physical Activity: Not on file  Stress: Not on file  Social Connections: Not on file     Family History: The patient's family history includes Alcohol abuse in her father and mother; Hypertension in her mother and sister; Stroke in her maternal grandmother.  ROS:   All other ROS reviewed and negative. Pertinent positives noted in the HPI.     EKGs/Labs/Other Studies Reviewed:   The following studies were personally reviewed by me today:  Carotid US 09/04/2021 Summary:  Right Carotid: Evidence consistent with a total occlusion of the right  ICA.   Left Carotid: The carotid endarterectomy site was well visualize  demonstrating               normal patency with no evidence of significant diameter  reduction.   Recent Labs: 08/22/2021: TSH 3.790   Recent Lipid Panel    Component Value Date/Time   CHOL 208 (H) 02/26/2022 1026   TRIG 66 02/26/2022 1026   HDL 105 02/26/2022 1026   CHOLHDL 2.0 02/26/2022 1026   LDLCALC 91 02/26/2022 1026    Physical Exam:   VS:  BP (!)  169/71   Pulse (!) 56   Ht '5\' 5"'$  (1.651 m)   Wt 251 lb 12.8 oz (114.2 kg)   SpO2 98%   BMI 41.90 kg/m    Wt Readings from Last 3 Encounters:  04/20/22 251 lb 12.8 oz (114.2 kg)  03/29/22 252 lb 6 oz (114.5 kg)  02/26/22 248 lb (112.5 kg)    General: Well nourished, well developed, in no acute distress Head: Atraumatic, normal size  Eyes: PEERLA, EOMI  Neck: Supple, no JVD Endocrine: No thryomegaly Cardiac: Normal S1, S2; regular rate and rhythm, 2 out of 6 systolic ejection murmur Lungs: Clear to auscultation bilaterally, no wheezing, rhonchi or rales  Abd: Soft, nontender, no hepatomegaly  Ext: No edema, pulses 2+ Musculoskeletal: No deformities, BUE and BLE strength normal and equal Skin: Warm and dry, no rashes   Neuro: Alert and oriented to person, place, time, and situation, CNII-XII grossly intact, no focal deficits  Psych: Normal mood and affect   ASSESSMENT:   GABRIANA WILMOTT is a 77 y.o. female who presents for the following: 1. Murmur, cardiac   2. SOB (shortness of breath) on exertion   3. Paroxysmal atrial fibrillation (HCC)   4. Acquired thrombophilia ([place])   5. Coronary artery disease involving native coronary artery of native heart without angina pectoris   6. Mixed hyperlipidemia   7. S/P TAVR (transcatheter aortic valve replacement)   8. S/P mitral valve repair   9. Primary hypertension   10. Carotid artery disease, unspecified laterality, unspecified type ([place])     PLAN:   1. Murmur, cardiac 2. SOB (shortness of breath) on exertion -Reports shortness of breath.  She does have a systolic murmur on examination which is new.  I would like to recheck an echo cardiogram.  She does have a history of mitral valve repair as well as TAVR.  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  We need to make sure there is no new change.  She is without signs of heart failure.  She denies chest pain or pressure.  3. Paroxysmal  atrial fibrillation (HCC) 4. Acquired thrombophilia ([place]) -History of paroxysmal A-fib.  On amiodarone.  She is not wanting to change this medication.  Continue with 200 mg daily.  She needs yearly chest x-ray, TSH and yearly eye exam.  She is on Eliquis as well.  No recurrence.  5. Coronary artery disease involving native coronary artery of native heart without angina pectoris 6. Mixed hyperlipidemia -Denies angina.  Not on aspirin as she is on Eliquis.  Continue Lipitor 20 mg daily.  7. S/P TAVR (transcatheter aortic valve replacement) 8. S/P mitral valve repair -Repeat echo as above.  9. Primary hypertension -BP elevated today however in the majority of the time she is controlled.  I really do not want to change her medication.  Will continue current dose.  I did inform her that she should check her blood pressure once daily.  Continue Coreg 25 mg twice daily, hydralazine 50 mg 3 times daily, Imdur 20 mg daily, valsartan 160 mg daily, HCTZ 25 mg daily.  10. Carotid artery disease, unspecified laterality, unspecified type ([place]) -Status post left CEA.  Right ICA 100%.  Needs yearly carotid ultrasounds.  Disposition: Return in about 4 months (around 08/19/2022).  Medication Adjustments/Labs and Tests Ordered: Current medicines are reviewed at length with the patient today.  Concerns regarding medicines are outlined above.  Orders Placed This Encounter  Procedures   ECHOCARDIOGRAM COMPLETE   No orders of the defined types were placed in this encounter.   Patient Instructions  Medication Instructions:  The current medical regimen is effective;  continue present plan and medications.  *If you need a refill on your cardiac medications before your next appointment, please call your pharmacy*   Testing/Procedures:  Echocardiogram - Your physician has requested that you have an echocardiogram. Echocardiography is a painless test that  uses sound waves to create images of your heart. It  provides your doctor with information about the size and shape of your heart and how well your heart's chambers and valves are working. This procedure takes approximately one hour. There are no restrictions for this procedure.    Follow-Up: At [facility], you and your health needs are our priority.  As part of our continuing mission to provide you with exceptional heart care, we have created designated Provider Care Teams.  These Care Teams include your primary Cardiologist (physician) and Advanced Practice Providers (APPs -  Physician Assistants and Nurse Practitioners) who all work together to provide you with the care you need, when you need it.  We recommend signing up for the patient portal called "MyChart".  Sign up information is provided on this After Visit Summary.  MyChart is used to connect with patients for Virtual Visits (Telemedicine).  Patients are able to view lab/test results, encounter notes, upcoming appointments, etc.  Non-urgent messages can be sent to your provider as well.   To learn more about what you can do with MyChart, go to [email or web address].    Your next appointment:   3 month(s)  Provider:   Evalina Field, MD       Time Spent with Patient: I have spent a total of 35 minutes with patient reviewing hospital notes, telemetry, EKGs, labs and examining the patient as well as establishing an assessment and plan that was discussed with the patient.  > 50% of time was spent in direct patient care.  Signed, Addison Naegeli. Audie Box, MD, Deputy  [address] [place], [place] [address] [phone]  04/20/2022 2:59 PM

## 2022-04-20 ENCOUNTER — Ambulatory Visit: Payer: Medicare Other | Attending: Cardiovascular Disease | Admitting: Cardiovascular Disease

## 2022-04-20 ENCOUNTER — Encounter: Payer: Self-pay | Admitting: Cardiovascular Disease

## 2022-04-20 VITALS — BP 169/71 | HR 56 | Ht 65.0 in | Wt 251.8 lb

## 2022-04-20 DIAGNOSIS — I480 Paroxysmal atrial fibrillation: Secondary | ICD-10-CM

## 2022-04-20 DIAGNOSIS — I10 Essential (primary) hypertension: Secondary | ICD-10-CM

## 2022-04-20 DIAGNOSIS — I2510 Atherosclerotic heart disease of native coronary artery without angina pectoris: Secondary | ICD-10-CM

## 2022-04-20 DIAGNOSIS — Z9889 Other specified postprocedural states: Secondary | ICD-10-CM

## 2022-04-20 DIAGNOSIS — Z952 Presence of prosthetic heart valve: Secondary | ICD-10-CM

## 2022-04-20 DIAGNOSIS — D6869 Other thrombophilia: Secondary | ICD-10-CM

## 2022-04-20 DIAGNOSIS — R0602 Shortness of breath: Secondary | ICD-10-CM | POA: Diagnosis not present

## 2022-04-20 DIAGNOSIS — R011 Cardiac murmur, unspecified: Secondary | ICD-10-CM | POA: Diagnosis not present

## 2022-04-20 DIAGNOSIS — I779 Disorder of arteries and arterioles, unspecified: Secondary | ICD-10-CM

## 2022-04-20 DIAGNOSIS — E782 Mixed hyperlipidemia: Secondary | ICD-10-CM

## 2022-04-20 NOTE — Patient Instructions (Signed)
Medication Instructions:  The current medical regimen is effective;  continue present plan and medications.  *If you need a refill on your cardiac medications before your next appointment, please call your pharmacy*   Testing/Procedures:  Echocardiogram - Your physician has requested that you have an echocardiogram. Echocardiography is a painless test that uses sound waves to create images of your heart. It provides your doctor with information about the size and shape of your heart and how well your heart's chambers and valves are working. This procedure takes approximately one hour. There are no restrictions for this procedure.    Follow-Up: At [facility], you and your health needs are our priority.  As part of our continuing mission to provide you with exceptional heart care, we have created designated Provider Care Teams.  These Care Teams include your primary Cardiologist (physician) and Advanced Practice Providers (APPs -  Physician Assistants and Nurse Practitioners) who all work together to provide you with the care you need, when you need it.  We recommend signing up for the patient portal called "MyChart".  Sign up information is provided on this After Visit Summary.  MyChart is used to connect with patients for Virtual Visits (Telemedicine).  Patients are able to view lab/test results, encounter notes, upcoming appointments, etc.  Non-urgent messages can be sent to your provider as well.   To learn more about what you can do with MyChart, go to [email or web address].    Your next appointment:   3 month(s)  Provider:   Evalina Field, MD

## 2022-04-23 ENCOUNTER — Ambulatory Visit (INDEPENDENT_AMBULATORY_CARE_PROVIDER_SITE_OTHER): Payer: Medicare Other

## 2022-04-23 DIAGNOSIS — R0602 Shortness of breath: Secondary | ICD-10-CM | POA: Diagnosis not present

## 2022-04-23 DIAGNOSIS — R011 Cardiac murmur, unspecified: Secondary | ICD-10-CM | POA: Diagnosis not present

## 2022-04-23 LAB — ECHOCARDIOGRAM COMPLETE
AR max vel: 0.59 cm2
AV Area VTI: 0.66 cm2
AV Area mean vel: 0.59 cm2
AV Mean grad: 12 mmHg
AV Peak grad: 23.2 mmHg
Ao pk vel: 2.41 m/s
Area-P 1/2: 2.99 cm2
MV VTI: 0.53 cm2
S' Lateral: 2.37 cm

## 2022-04-24 ENCOUNTER — Telehealth: Payer: Self-pay | Admitting: Family Medicine

## 2022-04-24 ENCOUNTER — Telehealth: Payer: Self-pay | Admitting: Pharmacist

## 2022-04-24 NOTE — Telephone Encounter (Signed)
Pt would like a call back with Echo results. Please advise.

## 2022-04-24 NOTE — Telephone Encounter (Signed)
Updated Eliquis patient assistance form with dosage and directions and faxed back to company

## 2022-04-24 NOTE — Telephone Encounter (Signed)
We have not received echo results, once received we give a call

## 2022-04-26 ENCOUNTER — Other Ambulatory Visit: Payer: Self-pay | Admitting: Family Medicine

## 2022-04-26 ENCOUNTER — Telehealth: Payer: Self-pay | Admitting: Cardiovascular Disease

## 2022-04-26 MED ORDER — ISOSORBIDE MONONITRATE 20 MG PO TABS: 20.0000 mg | ORAL_TABLET | Freq: Every day | ORAL | 3 refills | Status: DC

## 2022-04-26 NOTE — Telephone Encounter (Signed)
Called pt, went over he echo results. No further questions at this time.

## 2022-04-26 NOTE — Telephone Encounter (Signed)
Would like a refill on her isosorbide mononitrate (ISMO) 20 MG tablet. Please send this to [facility]. She is changing medication. She normally receives 90 days.She has 3 days of meds left.

## 2022-04-26 NOTE — Telephone Encounter (Signed)
Patient called to follow-up on test results. 

## 2022-04-26 NOTE — Telephone Encounter (Signed)
Dr. Cherlynn Kaiser, please see message.

## 2022-04-26 NOTE — Telephone Encounter (Signed)
Spoke to pt told her Dr. Cherlynn Kaiser sent her Rx to new pharmacy as requested and need to contact Cardiology for test results. Pt verbalized understanding and said she knows that Cardiology has to give her results. She was just letting Dr. Cherlynn Kaiser know she had it done. Told her okay.

## 2022-05-08 DIAGNOSIS — N1832 Chronic kidney disease, stage 3b: Secondary | ICD-10-CM | POA: Diagnosis not present

## 2022-05-08 DIAGNOSIS — N2581 Secondary hyperparathyroidism of renal origin: Secondary | ICD-10-CM | POA: Diagnosis not present

## 2022-05-08 DIAGNOSIS — D631 Anemia in chronic kidney disease: Secondary | ICD-10-CM | POA: Diagnosis not present

## 2022-05-08 DIAGNOSIS — I129 Hypertensive chronic kidney disease with stage 1 through stage 4 chronic kidney disease, or unspecified chronic kidney disease: Secondary | ICD-10-CM | POA: Diagnosis not present

## 2022-05-22 LAB — BASIC METABOLIC PANEL
BUN: 28 — AB (ref 4–21)
CO2: 26 — AB (ref 13–22)
Chloride: 104 (ref 99–108)
Creatinine: 1.8 — AB (ref 0.5–1.1)
Glucose: 138
Potassium: 4.2 mEq/L (ref 3.5–5.1)
Sodium: 139 (ref 137–147)

## 2022-05-22 LAB — COMPREHENSIVE METABOLIC PANEL
Albumin: 4.1 (ref 3.5–5.0)
Calcium: 9.7 (ref 8.7–10.7)
eGFR: 28

## 2022-05-29 ENCOUNTER — Other Ambulatory Visit: Payer: Self-pay | Admitting: Family Medicine

## 2022-05-29 ENCOUNTER — Telehealth: Payer: Self-pay | Admitting: Family Medicine

## 2022-05-29 ENCOUNTER — Telehealth: Payer: Self-pay | Admitting: Cardiovascular Disease

## 2022-05-29 ENCOUNTER — Other Ambulatory Visit: Payer: Self-pay

## 2022-05-29 DIAGNOSIS — I160 Hypertensive urgency: Secondary | ICD-10-CM

## 2022-05-29 DIAGNOSIS — R0602 Shortness of breath: Secondary | ICD-10-CM

## 2022-05-29 MED ORDER — LEVOTHYROXINE SODIUM 25 MCG PO TABS: 25.0000 ug | ORAL_TABLET | Freq: Every day | ORAL | 3 refills | Status: AC

## 2022-05-29 MED ORDER — PANTOPRAZOLE SODIUM 40 MG PO TBEC: 40.0000 mg | DELAYED_RELEASE_TABLET | Freq: Two times a day (BID) | ORAL | 3 refills | Status: DC

## 2022-05-29 MED ORDER — AMIODARONE HCL 200 MG PO TABS: 200.0000 mg | ORAL_TABLET | Freq: Every day | ORAL | 10 refills | Status: DC

## 2022-05-29 MED ORDER — NITROGLYCERIN 0.4 MG SL SUBL: 0.4000 mg | SUBLINGUAL_TABLET | SUBLINGUAL | 0 refills | Status: DC | PRN

## 2022-05-29 MED ORDER — CARVEDILOL 25 MG PO TABS: 25.0000 mg | ORAL_TABLET | Freq: Two times a day (BID) | ORAL | 3 refills | Status: AC

## 2022-05-29 MED ORDER — ATORVASTATIN CALCIUM 20 MG PO TABS: 20.0000 mg | ORAL_TABLET | Freq: Every day | ORAL | 3 refills | Status: DC

## 2022-05-29 MED ORDER — NITROGLYCERIN 0.4 MG SL SUBL: 0.4000 mg | SUBLINGUAL_TABLET | SUBLINGUAL | 0 refills | Status: AC | PRN

## 2022-05-29 NOTE — Telephone Encounter (Signed)
  Patient has new Preferred Pharmacy (listed below and in chart)-no longer uses [facility]  LAST APPOINTMENT DATE: 03/29/22  NEXT APPOINTMENT DATE: 07/30/22  MEDICATION:  levothyroxine (SYNTHROID) 25 MCG tablet  carvedilol (COREG) 25 MG tablet   atorvastatin (LIPITOR) 20 MG tablet   pantoprazole (PROTONIX) 40 MG tablet   Is the patient out of medication? 4 days left  PHARMACY:  [facility] [place], [place] [facility] [place] Phone: [phone]  Fax: [phone]

## 2022-05-29 NOTE — Telephone Encounter (Signed)
*  STAT* If patient is at the pharmacy, call can be transferred to refill team.   1. Which medications need to be refilled? (please list name of each medication and dose if known) amiodarone (PACERONE) 200 MG tablet and nitroGLYCERIN (NITROSTAT) 0.4 MG SL tablet   2. Which pharmacy/location (including street and city if local pharmacy) is medication to be sent to? [place], [place] - [address] AT [place]   3. Do they need a 30 day or 90 day supply? 90 day supply  Pt stated they only have one NitroGlycerin tablet left and they only have a week left for the Amiodarone

## 2022-05-30 ENCOUNTER — Encounter: Payer: Self-pay | Admitting: Family Medicine

## 2022-05-30 NOTE — Progress Notes (Signed)
Phosphorus-3.5

## 2022-06-07 ENCOUNTER — Telehealth: Payer: Self-pay | Admitting: Family Medicine

## 2022-06-07 NOTE — Telephone Encounter (Signed)
Contacted Mont Dutton to schedule their annual wellness visit. Appointment made for 06/12/2022.  [place] Direct Dial [phone]

## 2022-06-12 ENCOUNTER — Ambulatory Visit (INDEPENDENT_AMBULATORY_CARE_PROVIDER_SITE_OTHER): Payer: Medicare Other

## 2022-06-12 VITALS — Wt 251.0 lb

## 2022-06-12 DIAGNOSIS — Z0000 Encounter for general adult medical examination without abnormal findings: Secondary | ICD-10-CM

## 2022-06-12 NOTE — Progress Notes (Signed)
I connected with  Mont Dutton on 06/12/22 by a audio enabled telemedicine application and verified that I am speaking with the correct person using two identifiers.  Patient Location: Home  Provider Location: Office/Clinic  I discussed the limitations of evaluation and management by telemedicine. The patient expressed understanding and agreed to proceed.   Subjective:   Destiny Rice is a 77 y.o. female who presents for an Initial Medicare Annual Wellness Visit.  Review of Systems     Cardiac Risk Factors include: advanced age (>34men, >23 women);obesity (BMI >30kg/m2);diabetes mellitus;dyslipidemia;hypertension;sedentary lifestyle     Objective:    Today's Vitals   06/12/22 1506  Weight: 251 lb (113.9 kg)   Body mass index is 41.77 kg/m.     06/12/2022    3:25 PM 01/11/2021    2:49 PM 03/10/2020    7:59 AM  Advanced Directives  Does Patient Have a Medical Advance Directive? Yes No No  Type of Paramedic of [place];Living will    Copy of [place] in Chart? No - copy requested    Would patient like information on creating a medical advance directive?   No - Patient declined    Current Medications (verified) Outpatient Encounter Medications as of 06/12/2022  Medication Sig   ALPRAZolam (XANAX) 0.25 MG tablet Take 0.25 mg by mouth at bedtime as needed for anxiety.   amiodarone (PACERONE) 200 MG tablet Take 1 tablet (200 mg total) by mouth daily.   atorvastatin (LIPITOR) 20 MG tablet Take 1 tablet (20 mg total) by mouth daily.   Calcium Carb-Cholecalciferol (CALCIUM 600 + D PO) Take 1 tablet by mouth daily.   carvedilol (COREG) 25 MG tablet Take 1 tablet (25 mg total) by mouth 2 (two) times daily with a meal.   cholecalciferol (VITAMIN D3) 25 MCG (1000 UNIT) tablet Take 1,000 Units by mouth daily.   Cyanocobalamin (VITAMIN B-12) 5000 MCG TBDP Take 5,000 mcg by mouth daily.   ELIQUIS 5 MG TABS tablet TAKE 1 TABLET BY MOUTH  TWICE A DAY   Ferrous Sulfate (IRON PO) Take 2.5 mLs by mouth in the morning and at bedtime. Liquid Iron   fluticasone (FLONASE) 50 MCG/ACT nasal spray Place 1 spray into both nostrils daily as needed for allergies or rhinitis.   folic acid (FOLVITE) A999333 MCG tablet Take 400 mcg by mouth daily.   hydrALAZINE (APRESOLINE) 50 MG tablet Take 1 tablet (50 mg total) by mouth 3 (three) times daily.   isosorbide mononitrate (ISMO) 20 MG tablet Take 1 tablet (20 mg total) by mouth daily.   levothyroxine (SYNTHROID) 25 MCG tablet Take 1 tablet (25 mcg total) by mouth daily before breakfast.   meclizine (ANTIVERT) 12.5 MG tablet Take 1 tablet (12.5 mg total) by mouth 3 (three) times daily as needed for dizziness.   nitroGLYCERIN (NITROSTAT) 0.4 MG SL tablet Place 1 tablet (0.4 mg total) under the tongue every 5 (five) minutes as needed for chest pain.   pantoprazole (PROTONIX) 40 MG tablet Take 1 tablet (40 mg total) by mouth 2 (two) times daily before a meal.   potassium chloride SA (KLOR-CON M20) 20 MEQ tablet Take 1 tablet (20 mEq total) by mouth daily.   valsartan-hydrochlorothiazide (DIOVAN-HCT) 160-25 MG tablet Take 1 tablet by mouth daily.   No facility-administered encounter medications on file as of 06/12/2022.    Allergies (verified) Iodine-131 and Sulfa antibiotics   History: Past Medical History:  Diagnosis Date   AF (paroxysmal atrial fibrillation) (  [place]) 03/10/2020   Allergy    Chronic kidney disease, stage 3b ([place]) 03/10/2020   Coronary artery disease    10 stents -started 2014   Hypertension    Hypothyroidism 03/10/2020   Lipoma 2014   heart   Mixed hyperlipidemia due to type 2 diabetes mellitus ([place]) 03/10/2020   PONV (postoperative nausea and vomiting)    Type 2 diabetes mellitus with stage 3b chronic kidney disease, without long-term current use of insulin ([place]) 03/10/2020   Past Surgical History:  Procedure Laterality Date   ABDOMINAL HYSTERECTOMY     and bso   CAROTID  ENDARTERECTOMY Left 2021   CORONARY ANGIOPLASTY WITH STENT PLACEMENT     x10   MITRAL VALVE REPAIR  12/09/2012   TRANSCATHETER AORTIC VALVE REPLACEMENT, 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   Family History  Problem Relation Age of Onset   Hypertension Mother    Alcohol abuse Mother    Alcohol abuse Father    Hypertension Sister    Stroke Maternal Grandmother    Social History   Socioeconomic History   Marital status: Married    Spouse name: Not on file   Number of children: 1   Years of education: Not on file   Highest education level: Not on file  Occupational History   Occupation: retired  Tobacco Use   Smoking status: Never   Smokeless tobacco: Never  Vaping Use   Vaping Use: Never used  Substance and Sexual Activity   Alcohol use: Not Currently   Drug use: Never   Sexual activity: Not Currently  Other Topics Concern   Not on file  Social History Narrative   Son   4 grands, greats 12      Retired housekeeping.   Social Determinants of Health   Financial Resource Strain: Low Risk  (06/12/2022)   Overall Financial Resource Strain (CARDIA)    Difficulty of Paying Living Expenses: Not hard at all  Food Insecurity: No Food Insecurity (06/12/2022)   Hunger Vital Sign    Worried About Running Out of Food in the Last Year: Never true    Ran Out of Food in the Last Year: Never true  Transportation Needs: No Transportation Needs (06/12/2022)   PRAPARE - Hydrologist (Medical): No    Lack of Transportation (Non-Medical): No  Physical Activity: Insufficiently Active (06/12/2022)   Exercise Vital Sign    Days of Exercise per Week: 5 days    Minutes of Exercise per Session: 20 min  Stress: No Stress Concern Present (06/12/2022)   [place]    Feeling of Stress : Not at all  Social Connections: [place] (06/12/2022)   Social Connection and Isolation Panel [NHANES]    Frequency  of Communication with Friends and Family: More than three times a week    Frequency of Social Gatherings with Friends and Family: Once a week    Attends Religious Services: More than 4 times per year    Active Member of [employer] or Organizations: Yes    Attends Archivist Meetings: 1 to 4 times per year    Marital Status: Married    Tobacco Counseling Counseling given: Not Answered   Clinical Intake:  Pre-visit preparation completed: Yes  Pain : No/denies pain     BMI - recorded: 41.77 Nutritional Status: BMI > 30  Obese Nutritional Risks: None Diabetes: Yes CBG done?: Yes (114 per pt) CBG resulted in Enter/ Edit results?:  No Did pt. bring in CBG monitor from home?: No  How often do you need to have someone help you when you read instructions, pamphlets, or other written materials from your doctor or pharmacy?: 1 - Never  Diabetic?Nutrition Risk Assessment:  Has the patient had any N/V/D within the last 2 months?  No  Does the patient have any non-healing wounds?  No  Has the patient had any unintentional weight loss or weight gain?  No   Diabetes:  Is the patient diabetic?  Yes  If diabetic, was a CBG obtained today?  Yes  Did the patient bring in their glucometer from home?  No  How often do you monitor your CBG's? daily.   Financial Strains and Diabetes Management:  Are you having any financial strains with the device, your supplies or your medication? No .  Does the patient want to be seen by Chronic Care Management for management of their diabetes?  No  Would the patient like to be referred to a Nutritionist or for Diabetic Management?  No   Diabetic Exams:  Diabetic Eye Exam: Overdue for diabetic eye exam. Pt has been advised about the importance in completing this exam. Patient advised to call and schedule an eye exam. Diabetic Foot Exam: Completed 03/29/22   Interpreter Needed?: No  Information entered by :: Charlott Rakes, LPN   Activities of  Daily Living    06/12/2022    3:26 PM  In your present state of health, do you have any difficulty performing the following activities:  Hearing? 0  Vision? 0  Difficulty concentrating or making decisions? 0  Walking or climbing stairs? 0  Comment no stairs  Dressing or bathing? 0  Doing errands, shopping? 0  Preparing Food and eating ? N  Using the Toilet? N  In the past six months, have you accidently leaked urine? N  Do you have problems with loss of bowel control? N  Managing your Medications? N  Managing your Finances? N  Housekeeping or managing your Housekeeping? N    Patient Care Team: Tawnya Crook, MD as PCP - General (Family Medicine) [name], Cassie Freer, MD as PCP - Cardiology (Cardiology)  Indicate any recent Medical Services you may have received from other than [facility] providers in the past year (date may be approximate).     Assessment:   This is a routine wellness examination for [place].  Hearing/Vision screen Hearing Screening - Comments:: Pt denies any hearing issues  Vision Screening - Comments:: Pt follows up with my eye dr for annual eye exams   Dietary issues and exercise activities discussed: Current Exercise Habits: Home exercise routine, Type of exercise: Other - see comments;stretching, Time (Minutes): 20, Frequency (Times/Week): 5, Weekly Exercise (Minutes/Week): 100   Goals Addressed             This Visit's Progress    Patient Stated       Lose weight       Depression Screen    06/12/2022    3:21 PM 01/29/2022    3:08 PM  PHQ 2/9 Scores  PHQ - 2 Score 0 0  PHQ- 9 Score  0    Fall Risk    06/12/2022    3:26 PM 01/29/2022    3:04 PM  [place] in the past year?  0  Number falls in past yr:  0  Injury with Fall?  0  Risk for fall due to : Impaired mobility;Impaired balance/gait;Impaired vision  No Fall Risks  Follow up Falls prevention discussed Falls evaluation completed    FALL RISK PREVENTION PERTAINING TO THE  HOME:  Any stairs in or around the home? No  If so, are there any without handrails? No  Home free of loose throw rugs in walkways, pet beds, electrical cords, etc? Yes  Adequate lighting in your home to reduce risk of falls? Yes   ASSISTIVE DEVICES UTILIZED TO PREVENT FALLS:  Life alert? No  Use of a cane, walker or w/c? Yes  Grab bars in the bathroom? Yes  Shower chair or bench in shower? Yes  Elevated toilet seat or a handicapped toilet? yes  TIMED UP AND GO:  Was the test performed? No .   Cognitive Function:        06/12/2022    3:28 PM  6CIT Screen  What Year? 0 points  What month? 0 points  What time? 0 points  Count back from 20 2 points  Months in reverse 4 points  Repeat phrase 10 points  Total Score 16 points    Immunizations Immunization History  Administered Date(s) Administered   Fluad Quad(high Dose 65+) 12/12/2020   PFIZER(Purple Top)SARS-COV-2 Vaccination 07/04/2019, 07/25/2019   Pneumococcal Polysaccharide-23 08/09/2020   Tdap 02/15/2020    TDAP status: Due, Education has been provided regarding the importance of this vaccine. Advised may receive this vaccine at local pharmacy or Health Dept. Aware to provide a copy of the vaccination record if obtained from local pharmacy or Health Dept. Verbalized acceptance and understanding.  Flu Vaccine status: Declined, Education has been provided regarding the importance of this vaccine but patient still declined. Advised may receive this vaccine at local pharmacy or Health Dept. Aware to provide a copy of the vaccination record if obtained from local pharmacy or Health Dept. Verbalized acceptance and understanding.  Pneumococcal vaccine status: Due, Education has been provided regarding the importance of this vaccine. Advised may receive this vaccine at local pharmacy or Health Dept. Aware to provide a copy of the vaccination record if obtained from local pharmacy or Health Dept. Verbalized acceptance and  understanding.  Covid-19 vaccine status: Completed vaccines  Qualifies for Shingles Vaccine? Yes   Zostavax completed No   Shingrix Completed?: No.    Education has been provided regarding the importance of this vaccine. Patient has been advised to call insurance company to determine out of pocket expense if they have not yet received this vaccine. Advised may also receive vaccine at local pharmacy or Health Dept. Verbalized acceptance and understanding.  Screening Tests Health Maintenance  Topic Date Due   HEMOGLOBIN A1C  Never done   OPHTHALMOLOGY EXAM  Never done   Diabetic kidney evaluation - Urine ACR  Never done   Hepatitis C Screening  Never done   Zoster Vaccines- Shingrix (1 of 2) Never done   DEXA SCAN  Never done   COVID-19 Vaccine (3 - Pfizer risk series) 08/22/2019   Pneumonia Vaccine 61+ Years old (2 of 2 - PCV) 06/14/2022 (Originally 08/09/2021)   INFLUENZA VACCINE  06/24/2022 (Originally 10/24/2021)   FOOT EXAM  03/30/2023   Diabetic kidney evaluation - eGFR measurement  05/23/2023   Medicare Annual Wellness (AWV)  06/12/2023   DTaP/Tdap/Td (2 - Td or Tdap) 02/14/2030   HPV VACCINES  Aged Out    Health Maintenance  Health Maintenance Due  Topic Date Due   HEMOGLOBIN A1C  Never done   OPHTHALMOLOGY EXAM  Never done   Diabetic kidney evaluation -  Urine ACR  Never done   Hepatitis C Screening  Never done   Zoster Vaccines- Shingrix (1 of 2) Never done   DEXA SCAN  Never done   COVID-19 Vaccine (3 - Pfizer risk series) 08/22/2019    Colorectal cancer screening: No longer required.   Mammogram status: No longer required due to per pt .     Additional Screening:  Hepatitis C Screening: does qualify;   Vision Screening: Recommended annual ophthalmology exams for early detection of glaucoma and other disorders of the eye. Is the patient up to date with their annual eye exam?  No  Who is the provider or what is the name of the office in which the patient  attends annual eye exams? Pt will find a new provider  If pt is not established with a provider, would they like to be referred to a provider to establish care? No .   Dental Screening: Recommended annual dental exams for proper oral hygiene  Community Resource Referral / Chronic Care Management: CRR required this visit?  No   CCM required this visit?  No      Plan:     I have personally reviewed and noted the following in the patient's chart:   Medical and social history Use of alcohol, tobacco or illicit drugs  Current medications and supplements including opioid prescriptions. Patient is not currently taking opioid prescriptions. Functional ability and status Nutritional status Physical activity Advanced directives List of other physicians Hospitalizations, surgeries, and ER visits in previous 12 months Vitals Screenings to include cognitive, depression, and falls Referrals and appointments  In addition, I have reviewed and discussed with patient certain preventive protocols, quality metrics, and best practice recommendations. A written personalized care plan for preventive services as well as general preventive health recommendations were provided to patient.     Willette Brace, LPN   [id number]   Nurse Notes: Pt is stating that she needs a referral for a gastro doctor and that her blood pressures are running low in the diastolic. However after she drinks a glass of juice it goes up a little higher. Pt stated she didn't say this at appt please advise.

## 2022-06-12 NOTE — Patient Instructions (Signed)
Ms. Destiny Rice , Thank you for taking time to come for your Medicare Wellness Visit. I appreciate your ongoing commitment to your health goals. Please review the following plan we discussed and let me know if I can assist you in the future.   These are the goals we discussed:  Goals   None     This is a list of the screening recommended for you and due dates:  Health Maintenance  Topic Date Due   Hemoglobin A1C  Never done   Eye exam for diabetics  Never done   Yearly kidney health urinalysis for diabetes  Never done   Hepatitis C Screening: USPSTF Recommendation to screen - Ages 71-79 yo.  Never done   Zoster (Shingles) Vaccine (1 of 2) Never done   DEXA scan (bone density measurement)  Never done   COVID-19 Vaccine (3 - Pfizer risk series) 08/22/2019   Pneumonia Vaccine (2 of 2 - PCV) 06/14/2022*   Flu Shot  06/24/2022*   Complete foot exam   03/30/2023   Yearly kidney function blood test for diabetes  05/23/2023   Medicare Annual Wellness Visit  06/12/2023   DTaP/Tdap/Td vaccine (2 - Td or Tdap) 02/14/2030   HPV Vaccine  Aged Out  *Topic was postponed. The date shown is not the original due date.    Advanced directives: Advance directive discussed with you today. Even though you declined this today please call our office should you change your mind and we can give you the proper paperwork for you to fill out.   Conditions/risks identified: lose some weight   Next appointment: Follow up in one year for your annual wellness visit    Preventive Care 65 Years and Older, Female Preventive care refers to lifestyle choices and visits with your health care provider that can promote health and wellness. What does preventive care include? A yearly physical exam. This is also called an annual well check. Dental exams once or twice a year. Routine eye exams. Ask your health care provider how often you should have your eyes checked. Personal lifestyle choices, including: Daily care of  your teeth and gums. Regular physical activity. Eating a healthy diet. Avoiding tobacco and drug use. Limiting alcohol use. Practicing safe sex. Taking low-dose aspirin every day. Taking vitamin and mineral supplements as recommended by your health care provider. What happens during an annual well check? The services and screenings done by your health care provider during your annual well check will depend on your age, overall health, lifestyle risk factors, and family history of disease. Counseling  Your health care provider may ask you questions about your: Alcohol use. Tobacco use. Drug use. Emotional well-being. Home and relationship well-being. Sexual activity. Eating habits. History of falls. Memory and ability to understand (cognition). Work and work Statistician. Reproductive health. Screening  You may have the following tests or measurements: Height, weight, and BMI. Blood pressure. Lipid and cholesterol levels. These may be checked every 5 years, or more frequently if you are over 24 years old. Skin check. Lung cancer screening. You may have this screening every year starting at age 7 if you have a 30-pack-year history of smoking and currently smoke or have quit within the past 15 years. Fecal occult blood test (FOBT) of the stool. You may have this test every year starting at age 64. Flexible sigmoidoscopy or colonoscopy. You may have a sigmoidoscopy every 5 years or a colonoscopy every 10 years starting at age 23. Hepatitis C blood test. Hepatitis  B blood test. Sexually transmitted disease (STD) testing. Diabetes screening. This is done by checking your blood sugar (glucose) after you have not eaten for a while (fasting). You may have this done every 1-3 years. Bone density scan. This is done to screen for osteoporosis. You may have this done starting at age 13. Mammogram. This may be done every 1-2 years. Talk to your health care provider about how often you should  have regular mammograms. Talk with your health care provider about your test results, treatment options, and if necessary, the need for more tests. Vaccines  Your health care provider may recommend certain vaccines, such as: Influenza vaccine. This is recommended every year. Tetanus, diphtheria, and acellular pertussis (Tdap, Td) vaccine. You may need a Td booster every 10 years. Zoster vaccine. You may need this after age 44. Pneumococcal 13-valent conjugate (PCV13) vaccine. One dose is recommended after age 78. Pneumococcal polysaccharide (PPSV23) vaccine. One dose is recommended after age 17. Talk to your health care provider about which screenings and vaccines you need and how often you need them. This information is not intended to replace advice given to you by your health care provider. Make sure you discuss any questions you have with your health care provider. Document Released: 04/08/2015 Document Revised: 11/30/2015 Document Reviewed: 01/11/2015 Elsevier Interactive Patient Education  2017 [place] Prevention in the Home Falls can cause injuries. They can happen to people of all ages. There are many things you can do to make your home safe and to help prevent falls. What can I do on the outside of my home? Regularly fix the edges of walkways and driveways and fix any cracks. Remove anything that might make you trip as you walk through a door, such as a raised step or threshold. Trim any bushes or trees on the path to your home. Use bright outdoor lighting. Clear any walking paths of anything that might make someone trip, such as rocks or tools. Regularly check to see if handrails are loose or broken. Make sure that both sides of any steps have handrails. Any raised decks and porches should have guardrails on the edges. Have any leaves, snow, or ice cleared regularly. Use sand or salt on walking paths during winter. Clean up any spills in your garage right away. This  includes oil or grease spills. What can I do in the bathroom? Use night lights. Install grab bars by the toilet and in the tub and shower. Do not use towel bars as grab bars. Use non-skid mats or decals in the tub or shower. If you need to sit down in the shower, use a plastic, non-slip stool. Keep the floor dry. Clean up any water that spills on the floor as soon as it happens. Remove soap buildup in the tub or shower regularly. Attach bath mats securely with double-sided non-slip rug tape. Do not have throw rugs and other things on the floor that can make you trip. What can I do in the bedroom? Use night lights. Make sure that you have a light by your bed that is easy to reach. Do not use any sheets or blankets that are too big for your bed. They should not hang down onto the floor. Have a firm chair that has side arms. You can use this for support while you get dressed. Do not have throw rugs and other things on the floor that can make you trip. What can I do in the kitchen? Clean up any  spills right away. Avoid walking on wet floors. Keep items that you use a lot in easy-to-reach places. If you need to reach something above you, use a strong step stool that has a grab bar. Keep electrical cords out of the way. Do not use floor polish or wax that makes floors slippery. If you must use wax, use non-skid floor wax. Do not have throw rugs and other things on the floor that can make you trip. What can I do with my stairs? Do not leave any items on the stairs. Make sure that there are handrails on both sides of the stairs and use them. Fix handrails that are broken or loose. Make sure that handrails are as long as the stairways. Check any carpeting to make sure that it is firmly attached to the stairs. Fix any carpet that is loose or worn. Avoid having throw rugs at the top or bottom of the stairs. If you do have throw rugs, attach them to the floor with carpet tape. Make sure that you  have a light switch at the top of the stairs and the bottom of the stairs. If you do not have them, ask someone to add them for you. What else can I do to help prevent falls? Wear shoes that: Do not have high heels. Have rubber bottoms. Are comfortable and fit you well. Are closed at the toe. Do not wear sandals. If you use a stepladder: Make sure that it is fully opened. Do not climb a closed stepladder. Make sure that both sides of the stepladder are locked into place. Ask someone to hold it for you, if possible. Clearly mark and make sure that you can see: Any grab bars or handrails. First and last steps. Where the edge of each step is. Use tools that help you move around (mobility aids) if they are needed. These include: Canes. Walkers. Scooters. Crutches. Turn on the lights when you go into a dark area. Replace any light bulbs as soon as they burn out. Set up your furniture so you have a clear path. Avoid moving your furniture around. If any of your floors are uneven, fix them. If there are any pets around you, be aware of where they are. Review your medicines with your doctor. Some medicines can make you feel dizzy. This can increase your chance of falling. Ask your doctor what other things that you can do to help prevent falls. This information is not intended to replace advice given to you by your health care provider. Make sure you discuss any questions you have with your health care provider. Document Released: 01/06/2009 Document Revised: 08/18/2015 Document Reviewed: 04/16/2014 Elsevier Interactive Patient Education  2017 [employer].

## 2022-06-27 ENCOUNTER — Telehealth: Payer: Self-pay | Admitting: Cardiovascular Disease

## 2022-06-27 ENCOUNTER — Other Ambulatory Visit: Payer: Self-pay

## 2022-06-27 MED ORDER — HYDRALAZINE HCL 50 MG PO TABS: ORAL_TABLET | ORAL | 3 refills | Status: DC

## 2022-06-27 MED ORDER — APIXABAN 5 MG PO TABS: 5.0000 mg | ORAL_TABLET | Freq: Two times a day (BID) | ORAL | 5 refills | Status: DC

## 2022-06-27 NOTE — Telephone Encounter (Signed)
Pt c/o medication issue:  1. Name of Medication: ELIQUIS 5 MG TABS tablet   2. How are you currently taking this medication (dosage and times per day)? As prescribed   3. Are you having a reaction (difficulty breathing--STAT)?   4. What is your medication issue? Patient states that she is no longer receiving assistance for this medication with BM and would like a call back to discuss this and other options she would have for this medication.  She states she only has a few more pills left of this medication.

## 2022-06-27 NOTE — Telephone Encounter (Signed)
Called patient, she states she is taking Hydralazine 50 mg in the morning and 25 mg in the evening.   Spoke to Dr.[name]- agreed to have patient take 25 mg in the morning and 25 mg in the evening.   Med list updated.   Patient made aware.

## 2022-06-27 NOTE — Telephone Encounter (Signed)
Called patient- advised that she was not able to get her Eliquis any longer from patient assistance, she did not meet criteria for this year- she states she is able to get it from her pharmacy for $45- she may have to use her insurance, and provide what she pays out for medication and then she may be able to reapply to the program. I sent it over to her pharmacy as requested- she will let me know if she is not able to get it.   Patient also states she is having issues with her blood pressure readings again, she states for the last few weeks her blood pressure has been getting lower and lower.  Yesterday morning- 118/43 HR 62, yesterday evening 115/47 HR 59, and then right before bed 120/43 HR 60. She states she is not feeling well, tired. Patient is taking her medications as prescribed.  Patient has upcoming appointment on 04/30 which she is aware of. Advised I would route to MD to review and I would call her back with any recommendations.

## 2022-07-06 DIAGNOSIS — I739 Peripheral vascular disease, unspecified: Secondary | ICD-10-CM | POA: Diagnosis not present

## 2022-07-06 DIAGNOSIS — E1142 Type 2 diabetes mellitus with diabetic polyneuropathy: Secondary | ICD-10-CM | POA: Diagnosis not present

## 2022-07-06 DIAGNOSIS — L97312 Non-pressure chronic ulcer of right ankle with fat layer exposed: Secondary | ICD-10-CM | POA: Diagnosis not present

## 2022-07-06 DIAGNOSIS — M25571 Pain in right ankle and joints of right foot: Secondary | ICD-10-CM | POA: Diagnosis not present

## 2022-07-06 DIAGNOSIS — E1151 Type 2 diabetes mellitus with diabetic peripheral angiopathy without gangrene: Secondary | ICD-10-CM | POA: Diagnosis not present

## 2022-07-06 DIAGNOSIS — L84 Corns and callosities: Secondary | ICD-10-CM | POA: Diagnosis not present

## 2022-07-06 DIAGNOSIS — L603 Nail dystrophy: Secondary | ICD-10-CM | POA: Diagnosis not present

## 2022-07-13 ENCOUNTER — Telehealth: Payer: Self-pay | Admitting: Family Medicine

## 2022-07-13 NOTE — Telephone Encounter (Signed)
Noted  

## 2022-07-13 NOTE — Telephone Encounter (Signed)
FYI: This call has been transferred to Access Nurse. Once the result note has been entered staff can address the message at that time.  Patient called in with the following symptoms:  Red Word:dizziness and vomiting   Please advise at Mobile [phone] (mobile)  Message is routed to Provider Pool and [facility] Triage

## 2022-07-16 NOTE — Telephone Encounter (Signed)
Patient advised Go to ED Now   Patient Name: Destiny Rice Gender: Female DOB: Oct 16, 1945 Age: 77 Y 2 M 29 D Return Phone Number: [phone] (Primary), [phone] (Secondary) Address: City/ State/ Zip: [place]  [address] Client [facility] - [facility], [facility] Provider Ruthine Dose, Ann Contact Type Call Who Is Calling Patient / Member / Family / Caregiver Call Type Triage / Clinical Caller Name Atiana Levier Relationship To Patient Spouse Return Phone Number [phone] (Primary) Chief Complaint Dizziness Reason for Call Symptomatic / Request for Health Information Initial Comment Caller states he is calling regarding his spouse who has been dizzy and vomiting. She has also been passing bowels frequently. She has urinated in the last 8 hours. Translation No Nurse Assessment Nurse: Tori Milks, RN, Marchelle Folks Date/Time (Eastern Time): 07/13/2022 4:22:26 PM Confirm and document reason for call. If symptomatic, describe symptoms. ---Caller states that her blood pressure was 207/97 and is now 218/94; Dizzy, vomiting, and diarrhea past 2 hours Does the patient have any new or worsening symptoms? ---Yes Will a triage be completed? ---Yes Related visit to physician within the last 2 weeks? ---No Does the PT have any chronic conditions? (i.e. diabetes, asthma, this includes High risk factors for pregnancy, etc.) ---Yes List chronic conditions. ---Vertigo, Heart condition, HTN Is this a behavioral health or substance abuse call? ---No Guidelines Guideline Title Affirmed Question Affirmed Notes Nurse Date/Time (Eastern Time) Vomiting [1] MODERATE vomiting (e.g., 3 - 5 times/day) AND [2] age > 60 years Tori Milks, RN, Marchelle Folks 07/13/2022 4:24:54 PM  Disp. Time Lamount Cohen Time) Disposition Final User 07/13/2022 4:26:59 PM Go to ED Now (or PCP triage) Yes Tori Milks, RN, Marchelle Folks Final Disposition 07/13/2022 4:26:59  PM Go to ED Now (or PCP triage) Yes Titlow, RN, Earnestine Leys Disagree/Comply Comply Caller Understands Yes PreDisposition InappropriateToAsk Care Advice Given Per Guideline GO TO ED NOW (OR PCP TRIAGE): * IF NO PCP (PRIMARY CARE PROVIDER) SECOND-LEVEL TRIAGE: You need to be seen within the next hour. Go to the ED/UCC at _____________ Hospital. Leave as soon as you can. CARE ADVICE per Vomiting (Adult) guideline. * It is also a good idea to bring the pill bottles too. This will help the doctor to make certain you are taking the right medicines and the right dose. * Please bring a list of your current medicines when you go to see the doctor. BRING MEDICINES: BRING A BUCKET IN CASE OF VOMITING: * You may wish to bring a bucket, pan, or plastic bag with you in case there is more vomiting during the drive. Referrals [facility] - ED

## 2022-07-16 NOTE — Telephone Encounter (Signed)
FYI

## 2022-07-20 DIAGNOSIS — L97311 Non-pressure chronic ulcer of right ankle limited to breakdown of skin: Secondary | ICD-10-CM | POA: Diagnosis not present

## 2022-07-23 NOTE — Progress Notes (Unsigned)
Cardiology Office Note:   Date:  07/24/2022  NAME:  Destiny Rice    MRN: [id number] DOB:  1946-03-05   PCP:  Jeani Sow, MD  Cardiologist:  Reatha Harps, MD  Electrophysiologist:  None   Referring MD: Jeani Sow, MD   Chief Complaint  Patient presents with   Follow-up   History of Present Illness:   Destiny Rice is a 77 y.o. female with a hx of TAVR, pAF, carotid artery disease, CKD, HLD, DM who presents for follow-up.  Blood pressure elevated today.  She reports values are well-controlled at home.  Currently on hydralazine 50 mg twice daily, carvedilol 25 mg twice daily, Imdur 30 mg daily, valsartan 160 mg daily, HCTZ 25 mg daily.  Most recent A1c 7.0.  LDL 91.  She does have a right lower extremity ulcer on her foot.  Noted on the lateral malleolus.  Reports she had a callus removed.  Not healing.  Poor pulses in the right lower extremity.  2+ pulses in the left lower extremity.  She does have a known history of carotid artery disease but has never had lower extremity PAD.  Most recent echo shows normal LV function.  CV exam unremarkable except for poor pulses in lower extremity wound.  Denies chest pains or trouble breathing.  Kidney function is stable.  Problem List 1. CAD -prior PCI 2. Cardiac tumor 3. Mitral Valve Repair -26 mm Edwards annuloplasty ring -12/09/2012 4. Severe aortic stenosis s/p 26 mm Evolut Pro -04/24/2018 4. Paroxysmal Afib 5. Carotid Artery Disease s/p L CEA -R ICA 100% 6. HTN 7. DM -A1c 7.0 8. HLD -T chol 208, HDL 105, LDL 91, TG 66 9. 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  Past Medical History: Past Medical History:  Diagnosis Date   AF (paroxysmal atrial fibrillation) (HCC) 03/10/2020   Allergy    Chronic kidney disease, stage 3b (HCC) 03/10/2020   Coronary artery disease    10 stents -started 2014   Hypertension    Hypothyroidism 03/10/2020   Lipoma 2014   heart   Mixed hyperlipidemia due to type 2 diabetes mellitus (HCC) 03/10/2020    PONV (postoperative nausea and vomiting)    Type 2 diabetes mellitus with stage 3b chronic kidney disease, without long-term current use of insulin (HCC) 03/10/2020    Past Surgical History: Past Surgical History:  Procedure Laterality Date   ABDOMINAL HYSTERECTOMY     and bso   CAROTID ENDARTERECTOMY Left 2021   CORONARY ANGIOPLASTY WITH STENT PLACEMENT     x10   MITRAL VALVE REPAIR  12/09/2012   TRANSCATHETER AORTIC VALVE REPLACEMENT, 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    Current Medications: Current Meds  Medication Sig   amiodarone (PACERONE) 200 MG tablet Take 1 tablet (200 mg total) by mouth daily.   apixaban (ELIQUIS) 5 MG TABS tablet Take 1 tablet (5 mg total) by mouth 2 (two) times daily.   atorvastatin (LIPITOR) 20 MG tablet Take 1 tablet (20 mg total) by mouth daily.   Calcium Carb-Cholecalciferol (CALCIUM 600 + D PO) Take 1 tablet by mouth daily.   carvedilol (COREG) 25 MG tablet Take 1 tablet (25 mg total) by mouth 2 (two) times daily with a meal.   cholecalciferol (VITAMIN D3) 25 MCG (1000 UNIT) tablet Take 1,000 Units by mouth daily.   Cyanocobalamin (VITAMIN B-12) 5000 MCG TBDP Take 5,000 mcg by mouth daily.   Ferrous Sulfate (IRON PO) Take 2.5 mLs by mouth in the morning and at bedtime. Liquid  Iron   fluticasone (FLONASE) 50 MCG/ACT nasal spray Place 1 spray into both nostrils daily as needed for allergies or rhinitis.   folic acid (FOLVITE) 400 MCG tablet Take 400 mcg by mouth daily.   hydrALAZINE (APRESOLINE) 50 MG tablet Take 0.5 tablet (25 mg) in the morning and 0.5 tablet (25 mg) in the evening.   isosorbide mononitrate (ISMO) 20 MG tablet Take 1 tablet (20 mg total) by mouth daily.   levothyroxine (SYNTHROID) 25 MCG tablet Take 1 tablet (25 mcg total) by mouth daily before breakfast.   meclizine (ANTIVERT) 12.5 MG tablet Take 1 tablet (12.5 mg total) by mouth 3 (three) times daily as needed for dizziness.   pantoprazole (PROTONIX) 40 MG tablet Take 1 tablet (40 mg total)  by mouth 2 (two) times daily before a meal.   potassium chloride SA (KLOR-CON M20) 20 MEQ tablet Take 1 tablet (20 mEq total) by mouth daily.   valsartan-hydrochlorothiazide (DIOVAN-HCT) 160-25 MG tablet Take 1 tablet by mouth daily.     Allergies:    Iodine-131 and Sulfa antibiotics   Social History: Social History   Socioeconomic History   Marital status: Married    Spouse name: Not on file   Number of children: 1   Years of education: Not on file   Highest education level: Not on file  Occupational History   Occupation: retired  Tobacco Use   Smoking status: Never   Smokeless tobacco: Never  Vaping Use   Vaping Use: Never used  Substance and Sexual Activity   Alcohol use: Not Currently   Drug use: Never   Sexual activity: Not Currently  Other Topics Concern   Not on file  Social History Narrative   Son   4 grands, greats 12      Retired housekeeping.   Social Determinants of Health   Financial Resource Strain: Low Risk  (06/12/2022)   Overall Financial Resource Strain (CARDIA)    Difficulty of Paying Living Expenses: Not hard at all  Food Insecurity: No Food Insecurity (06/12/2022)   Hunger Vital Sign    Worried About Running Out of Food in the Last Year: Never true    Ran Out of Food in the Last Year: Never true  Transportation Needs: No Transportation Needs (06/12/2022)   PRAPARE - Administrator, Civil Service (Medical): No    Lack of Transportation (Non-Medical): No  Physical Activity: Insufficiently Active (06/12/2022)   Exercise Vital Sign    Days of Exercise per Week: 5 days    Minutes of Exercise per Session: 20 min  Stress: No Stress Concern Present (06/12/2022)   [employer] of Occupational Health - Occupational Stress Questionnaire    Feeling of Stress : Not at all  Social Connections: Socially Integrated (06/12/2022)   Social Connection and Isolation Panel [NHANES]    Frequency of Communication with Friends and Family: More than  three times a week    Frequency of Social Gatherings with Friends and Family: Once a week    Attends Religious Services: More than 4 times per year    Active Member of [employer] or Organizations: Yes    Attends Banker Meetings: 1 to 4 times per year    Marital Status: Married     Family History: The patient's family history includes Alcohol abuse in her father and mother; Hypertension in her mother and sister; Stroke in her maternal grandmother.  ROS:   All other ROS reviewed and negative. Pertinent positives noted in  the HPI.     EKGs/Labs/Other Studies Reviewed:   The following studies were personally reviewed by me today:  Recent Labs: 08/22/2021: TSH 3.790 05/22/2022: BUN 28; Creatinine 1.8; Potassium 4.2; Sodium 139   Recent Lipid Panel    Component Value Date/Time   CHOL 208 (H) 02/26/2022 1026   TRIG 66 02/26/2022 1026   HDL 105 02/26/2022 1026   CHOLHDL 2.0 02/26/2022 1026   LDLCALC 91 02/26/2022 1026    Physical Exam:   VS:  BP (!) 154/92 (BP Location: Left Arm, Patient Position: Sitting, Cuff Size: Large)   Pulse 67   Ht 5\' 5"  (1.651 m)   Wt 250 lb 3.2 oz (113.5 kg)   SpO2 93%   BMI 41.64 kg/m    Wt Readings from Last 3 Encounters:  07/24/22 250 lb 3.2 oz (113.5 kg)  06/12/22 251 lb (113.9 kg)  04/20/22 251 lb 12.8 oz (114.2 kg)    General: Well nourished, well developed, in no acute distress Head: Atraumatic, normal size  Eyes: PEERLA, EOMI  Neck: Supple, no JVD Endocrine: No thryomegaly Cardiac: Normal S1, S2; RRR; no murmurs, rubs, or gallops Lungs: Clear to auscultation bilaterally, no wheezing, rhonchi or rales  Abd: Soft, nontender, no hepatomegaly  Ext: Poor pulses in right lower extremity, ulceration noted over the right lateral malleolus, 2+ pulses in the left lower extremity Musculoskeletal: No deformities, BUE and BLE strength normal and equal Skin: Warm and dry, no rashes   Neuro: Alert and oriented to person, place, time, and  situation, CNII-XII grossly intact, no focal deficits  Psych: Normal mood and affect   ASSESSMENT:   LEANORA MURIN is a 77 y.o. female who presents for the following: 1. Ulcer of right foot, unspecified ulcer stage (HCC)   2. Paroxysmal atrial fibrillation (HCC)   3. Acquired thrombophilia (HCC)   4. Coronary artery disease involving native coronary artery of native heart without angina pectoris   5. Mixed hyperlipidemia   6. S/P TAVR (transcatheter aortic valve replacement)   7. S/P mitral valve repair   8. Primary hypertension   9. Carotid artery disease, unspecified laterality, unspecified type (HCC)     PLAN:   1. Ulcer of right foot, unspecified ulcer stage (HCC) -Poor healing wound noted in the right lower extremity.  Absent pulses.  I would like her to get ABIs.  Suspect she may have significant PAD as this wound is not healing.  2. Paroxysmal atrial fibrillation (HCC) 3. Acquired thrombophilia (HCC) -No recurrence on amiodarone.  She wishes to continue this.  Thyroid studies within limits.  Chest x-ray negative.  She gets yearly eye exams.  She will continue on Eliquis for now.  No significant bruising or bleeding.  4. Coronary artery disease involving native coronary artery of native heart without angina pectoris 5. Mixed hyperlipidemia -History of coronary artery disease as well as CAD.  Not on aspirin as he is on Eliquis.  She is on Lipitor 20 mg daily.  LDL not quite at goal.  High HDL noted.  We will titrate therapy as we are able.  No symptoms of angina.  6. S/P TAVR (transcatheter aortic valve replacement) -Status post TAVR in the past.  Understands she needs antibiotics before dental work.  Stable gradients based on recent echo.  7. S/P mitral valve repair -Stable repair based on recent echo.  8. Primary hypertension -Reports values are well-controlled at home.  Continue carvedilol 25 mg twice daily, hydralazine 50 mg twice daily, Imdur 20  mg daily, valsartan 160  mg daily, HCTZ 25 mg daily.  She will continue to keep a log of her blood pressure.  9. Carotid artery disease, unspecified laterality, unspecified type (HCC) -Status post left CEA.  Right ICA occluded.  Stable ultrasound recently.  We will continue with yearly surveillance.      Disposition: Return in about 6 months (around 01/23/2023).  Medication Adjustments/Labs and Tests Ordered: Current medicines are reviewed at length with the patient today.  Concerns regarding medicines are outlined above.  Orders Placed This Encounter  Procedures   VAS [place] ABI WITH/WO TBI   No orders of the defined types were placed in this encounter.   Patient Instructions  Medication Instructions:  The current medical regimen is effective;  continue present plan and medications.  *If you need a refill on your cardiac medications before your next appointment, please call your pharmacy*  Testing:  Your physician has requested that you have an ankle brachial index (ABI). During this test an ultrasound and blood pressure cuff are used to evaluate the arteries that supply the arms and legs with blood. Allow thirty minutes for this exam. There are no restrictions or special instructions.   Follow-Up: At [facility], you and your health needs are our priority.  As part of our continuing mission to provide you with exceptional heart care, we have created designated Provider Care Teams.  These Care Teams include your primary Cardiologist (physician) and Advanced Practice Providers (APPs -  Physician Assistants and Nurse Practitioners) who all work together to provide you with the care you need, when you need it.  We recommend signing up for the patient portal called "MyChart".  Sign up information is provided on this After Visit Summary.  MyChart is used to connect with patients for Virtual Visits (Telemedicine).  Patients are able to view lab/test results, encounter notes, upcoming appointments, etc.   Non-urgent messages can be sent to your provider as well.   To learn more about what you can do with MyChart, go to [email or web address].    Your next appointment:   6 month(s)  Provider:   Reatha Harps, MD        Time Spent with Patient: I have spent a total of 35 minutes with patient reviewing hospital notes, telemetry, EKGs, labs and examining the patient as well as establishing an assessment and plan that was discussed with the patient.  > 50% of time was spent in direct patient care.  Signed, Lenna Gilford. Flora Lipps, MD, [facility]  [facility]  [address], [address] [place], [place] [address] [phone]  07/24/2022 8:01 PM

## 2022-07-24 ENCOUNTER — Ambulatory Visit: Payer: Medicare Other | Attending: Cardiovascular Disease | Admitting: Cardiovascular Disease

## 2022-07-24 ENCOUNTER — Encounter: Payer: Self-pay | Admitting: Cardiovascular Disease

## 2022-07-24 VITALS — BP 154/92 | HR 67 | Ht 65.0 in | Wt 250.2 lb

## 2022-07-24 DIAGNOSIS — Z952 Presence of prosthetic heart valve: Secondary | ICD-10-CM

## 2022-07-24 DIAGNOSIS — I480 Paroxysmal atrial fibrillation: Secondary | ICD-10-CM | POA: Diagnosis not present

## 2022-07-24 DIAGNOSIS — D6869 Other thrombophilia: Secondary | ICD-10-CM | POA: Diagnosis not present

## 2022-07-24 DIAGNOSIS — I779 Disorder of arteries and arterioles, unspecified: Secondary | ICD-10-CM

## 2022-07-24 DIAGNOSIS — Z9889 Other specified postprocedural states: Secondary | ICD-10-CM

## 2022-07-24 DIAGNOSIS — L97519 Non-pressure chronic ulcer of other part of right foot with unspecified severity: Secondary | ICD-10-CM | POA: Diagnosis not present

## 2022-07-24 DIAGNOSIS — E782 Mixed hyperlipidemia: Secondary | ICD-10-CM

## 2022-07-24 DIAGNOSIS — I2510 Atherosclerotic heart disease of native coronary artery without angina pectoris: Secondary | ICD-10-CM | POA: Diagnosis not present

## 2022-07-24 DIAGNOSIS — I10 Essential (primary) hypertension: Secondary | ICD-10-CM

## 2022-07-24 NOTE — Patient Instructions (Addendum)
Medication Instructions:  The current medical regimen is effective;  continue present plan and medications.  *If you need a refill on your cardiac medications before your next appointment, please call your pharmacy*  Testing:  Your physician has requested that you have an ankle brachial index (ABI). During this test an ultrasound and blood pressure cuff are used to evaluate the arteries that supply the arms and legs with blood. Allow thirty minutes for this exam. There are no restrictions or special instructions.   Follow-Up: At [facility], you and your health needs are our priority.  As part of our continuing mission to provide you with exceptional heart care, we have created designated Provider Care Teams.  These Care Teams include your primary Cardiologist (physician) and Advanced Practice Providers (APPs -  Physician Assistants and Nurse Practitioners) who all work together to provide you with the care you need, when you need it.  We recommend signing up for the patient portal called "MyChart".  Sign up information is provided on this After Visit Summary.  MyChart is used to connect with patients for Virtual Visits (Telemedicine).  Patients are able to view lab/test results, encounter notes, upcoming appointments, etc.  Non-urgent messages can be sent to your provider as well.   To learn more about what you can do with MyChart, go to [email or web address].    Your next appointment:   6 month(s)  Provider:   Reatha Harps, MD

## 2022-07-30 ENCOUNTER — Ambulatory Visit (INDEPENDENT_AMBULATORY_CARE_PROVIDER_SITE_OTHER): Payer: Medicare Other | Admitting: Family Medicine

## 2022-07-30 ENCOUNTER — Encounter: Payer: Self-pay | Admitting: Family Medicine

## 2022-07-30 ENCOUNTER — Other Ambulatory Visit: Payer: Self-pay | Admitting: Cardiovascular Disease

## 2022-07-30 VITALS — BP 160/90 | HR 64 | Temp 97.8°F | Resp 18 | Ht 65.0 in | Wt 250.2 lb

## 2022-07-30 DIAGNOSIS — N1832 Chronic kidney disease, stage 3b: Secondary | ICD-10-CM | POA: Diagnosis not present

## 2022-07-30 DIAGNOSIS — I1310 Hypertensive heart and chronic kidney disease without heart failure, with stage 1 through stage 4 chronic kidney disease, or unspecified chronic kidney disease: Secondary | ICD-10-CM

## 2022-07-30 DIAGNOSIS — E038 Other specified hypothyroidism: Secondary | ICD-10-CM | POA: Diagnosis not present

## 2022-07-30 DIAGNOSIS — E1169 Type 2 diabetes mellitus with other specified complication: Secondary | ICD-10-CM | POA: Diagnosis not present

## 2022-07-30 DIAGNOSIS — I480 Paroxysmal atrial fibrillation: Secondary | ICD-10-CM

## 2022-07-30 DIAGNOSIS — E1122 Type 2 diabetes mellitus with diabetic chronic kidney disease: Secondary | ICD-10-CM | POA: Diagnosis not present

## 2022-07-30 DIAGNOSIS — E782 Mixed hyperlipidemia: Secondary | ICD-10-CM

## 2022-07-30 DIAGNOSIS — L97519 Non-pressure chronic ulcer of other part of right foot with unspecified severity: Secondary | ICD-10-CM

## 2022-07-30 MED ORDER — ONDANSETRON 4 MG PO TBDP: 4.0000 mg | ORAL_TABLET | Freq: Three times a day (TID) | ORAL | 0 refills | Status: DC | PRN

## 2022-07-30 NOTE — Patient Instructions (Signed)
It was very nice to see you today!  Can 2 meclizine if needed if vertigo severe and 1 not working   PLEASE NOTE:  If you had any lab tests please let us know if you have not heard back within a few days. You may see your results on MyChart before we have a chance to review them but we will give you a call once they are reviewed by [place]. If we ordered any referrals today, please let us know if you have not heard from their office within the next week.   Please try these tips to maintain a healthy lifestyle:  Eat most of your calories during the day when you are active. Eliminate processed foods including packaged sweets (pies, cakes, cookies), reduce intake of potatoes, white bread, white pasta, and white rice. Look for whole grain options, oat flour or almond flour.  Each meal should contain half fruits/vegetables, one quarter protein, and one quarter carbs (no bigger than a computer mouse).  Cut down on sweet beverages. This includes juice, soda, and sweet tea. Also watch fruit intake, though this is a healthier sweet option, it still contains natural sugar! Limit to 3 servings daily.  Drink at least 1 glass of water with each meal and aim for at least 8 glasses per day  Exercise at least 150 minutes every week.

## 2022-07-30 NOTE — Progress Notes (Signed)
Subjective:     Patient ID: Destiny Rice, female    DOB: November 16, 1945, 77 y.o.   MRN: [id number]  Chief Complaint  Patient presents with   Medical Management of Chronic Issues    6 month follow-up on dm and thyroid    HPI-here w/husband  DM type 2-diet controlled-no medications.  Did DM teaching.  Metformin in past.  Sugar 131 this am. Stays 90-130.  Fair diet. HYPERTENSION-Pt is on hydralazine 50bid, coreg 25 mg twice daily, valsartan/hct 160/25 .  Bp's running 120-140/50's .  No ha/dizziness/cp/palp/edema.  Occasional cough, occasional sob Hypothyroid-doing ok on synthroid 25.  Doing well.  HLD-taking atorvastatin 20 mg  Chronic kidney disease-seeing neph q 6 month(s).  Stable Peripheral arterial disease-has appointment this month Atrial fibrillation-doing well on amiodorone 200 mg and eliquis 5 mg twice daily  no bleeding  Health Maintenance Due  Topic Date Due   HEMOGLOBIN A1C  Never done   OPHTHALMOLOGY EXAM  Never done   Diabetic kidney evaluation - Urine ACR  Never done   Hepatitis C Screening  Never done   DEXA SCAN  Never done    Past Medical History:  Diagnosis Date   AF (paroxysmal atrial fibrillation) (HCC) 03/10/2020   Allergy    Chronic kidney disease, stage 3b (HCC) 03/10/2020   Coronary artery disease    10 stents -started 2014   Hypertension    Hypothyroidism 03/10/2020   Lipoma 2014   heart   Mixed hyperlipidemia due to type 2 diabetes mellitus (HCC) 03/10/2020   PONV (postoperative nausea and vomiting)    Type 2 diabetes mellitus with stage 3b chronic kidney disease, without long-term current use of insulin (HCC) 03/10/2020    Past Surgical History:  Procedure Laterality Date   ABDOMINAL HYSTERECTOMY     and bso   CAROTID ENDARTERECTOMY Left 2021   CORONARY ANGIOPLASTY WITH STENT PLACEMENT     x10   MITRAL VALVE REPAIR  12/09/2012   TRANSCATHETER AORTIC VALVE REPLACEMENT, 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     Current Outpatient Medications:     ALPRAZolam (XANAX) 0.25 MG tablet, Take 0.25 mg by mouth at bedtime as needed for anxiety., Disp: , Rfl:    amiodarone (PACERONE) 200 MG tablet, Take 1 tablet (200 mg total) by mouth daily., Disp: 30 tablet, Rfl: 10   apixaban (ELIQUIS) 5 MG TABS tablet, Take 1 tablet (5 mg total) by mouth 2 (two) times daily., Disp: 60 tablet, Rfl: 5   atorvastatin (LIPITOR) 20 MG tablet, Take 1 tablet (20 mg total) by mouth daily., Disp: 90 tablet, Rfl: 3   Calcium Carb-Cholecalciferol (CALCIUM 600 + D PO), Take 1 tablet by mouth daily., Disp: , Rfl:    carvedilol (COREG) 25 MG tablet, Take 1 tablet (25 mg total) by mouth 2 (two) times daily with a meal., Disp: 180 tablet, Rfl: 3   cholecalciferol (VITAMIN D3) 25 MCG (1000 UNIT) tablet, Take 1,000 Units by mouth daily., Disp: , Rfl:    Cyanocobalamin (VITAMIN B-12) 5000 MCG TBDP, Take 5,000 mcg by mouth daily., Disp: , Rfl:    Ferrous Sulfate (IRON PO), Take 2.5 mLs by mouth in the morning and at bedtime. Liquid Iron, Disp: , Rfl:    fluticasone (FLONASE) 50 MCG/ACT nasal spray, Place 1 spray into both nostrils daily as needed for allergies or rhinitis., Disp: , Rfl:    folic acid (FOLVITE) 400 MCG tablet, Take 400 mcg by mouth daily., Disp: , Rfl:    hydrALAZINE (APRESOLINE) 50 MG  tablet, Take 0.5 tablet (25 mg) in the morning and 0.5 tablet (25 mg) in the evening., Disp: 270 tablet, Rfl: 3   isosorbide mononitrate (ISMO) 20 MG tablet, Take 1 tablet (20 mg total) by mouth daily., Disp: 90 tablet, Rfl: 3   levothyroxine (SYNTHROID) 25 MCG tablet, Take 1 tablet (25 mcg total) by mouth daily before breakfast., Disp: 90 tablet, Rfl: 3   meclizine (ANTIVERT) 12.5 MG tablet, Take 1 tablet (12.5 mg total) by mouth 3 (three) times daily as needed for dizziness., Disp: 180 tablet, Rfl: 1   nitroGLYCERIN (NITROSTAT) 0.4 MG SL tablet, Place 1 tablet (0.4 mg total) under the tongue every 5 (five) minutes as needed for chest pain., Disp: 25 tablet, Rfl: 0   ondansetron  (ZOFRAN-ODT) 4 MG disintegrating tablet, Take 1 tablet (4 mg total) by mouth every 8 (eight) hours as needed for nausea or vomiting., Disp: 10 tablet, Rfl: 0   pantoprazole (PROTONIX) 40 MG tablet, Take 1 tablet (40 mg total) by mouth 2 (two) times daily before a meal., Disp: 180 tablet, Rfl: 3   potassium chloride SA (KLOR-CON M20) 20 MEQ tablet, Take 1 tablet (20 mEq total) by mouth daily., Disp: 90 tablet, Rfl: 3   valsartan-hydrochlorothiazide (DIOVAN-HCT) 160-25 MG tablet, Take 1 tablet by mouth daily., Disp: 90 tablet, Rfl: 3  Allergies  Allergen Reactions   Iodine-131 Nausea Only   Sulfa Antibiotics Hives   ROS neg/noncontributory except as noted HPI/below Vertigo-bad.  About 2 weeks ago. Vomiting and diarrhea.  Blood pressure was over 200.  Was finally able to get some food into herself.  Lasted 1 week(s).  Intermitt throughout day. Meclizine didn't help this time. Didn't go to ER.   Got Gatorade.       Objective:     BP (!) 160/90 (BP Location: Left Arm, Patient Position: Sitting, Cuff Size: Large)   Pulse 64   Temp 97.8 F (36.6 C) (Temporal)   Resp 18   Ht 5\' 5"  (1.651 m)   Wt 250 lb 3.2 oz (113.5 kg)   SpO2 97%   BMI 41.64 kg/m  Wt Readings from Last 3 Encounters:  07/30/22 250 lb 3.2 oz (113.5 kg)  07/24/22 250 lb 3.2 oz (113.5 kg)  06/12/22 251 lb (113.9 kg)    Physical Exam   Gen: WDWN NAD HEENT: NCAT, conjunctiva not injected, sclera nonicteric NECK:  supple, no thyromegaly, no nodes, no carotid bruits CARDIAC: RRR, S1S2+, 2/6 murmur. LUNGS: CTAB. No wheezes ABDOMEN:  BS+, soft, NTND, No HSM, no masses EXT: tr edema MSK: cane  NEURO: A&O x3.  CN II-XII intact.  PSYCH: normal mood. Good eye contact     Assessment & Plan:  Type 2 diabetes mellitus with stage 3b chronic kidney disease, without long-term current use of insulin (HCC) Assessment & Plan: Chronic.  Has been well-controlled with diet.  Check A1c, CMP, microalbumin creatinine  ratio  Orders: -     Hemoglobin A1c -     Comprehensive metabolic panel -     Microalbumin / creatinine urine ratio  Mixed hyperlipidemia due to type 2 diabetes mellitus (HCC) Assessment & Plan: Chronic.  Controlled.  Continue atorvastatin 20 mg daily.  Check lipids, CMP  Orders: -     Comprehensive metabolic panel -     Lipid panel -     CBC with Differential/Platelet  Other specified hypothyroidism Assessment & Plan: Neck.  Well-controlled.  Continue Synthroid 0.025.  Check TSH  Orders: -  TSH  Hypertensive heart and renal disease with renal failure, stage 1 through stage 4 or unspecified chronic kidney disease, without heart failure Assessment & Plan: Chronic.  Not controlled today.  Per patient, well-controlled at home and cardiology just adjusted her meds again.  Advised that she needs to get her cuff checked.  Continue hydralazine 50 mg twice daily, valsartan HCT 160/25 twice daily, carvedilol 25 mg twice daily   AF (paroxysmal atrial fibrillation) (HCC) Assessment & Plan: Chronic.  Well-controlled.  Continue amiodarone 200 mg daily, Eliquis 5 mg twice daily.  Check CBC.  Care per cardiology   Chronic kidney disease, stage 3b [facility]) Assessment & Plan: Chronic.  Stable.  Managed by nephrology   Other orders -     Ondansetron; Take 1 tablet (4 mg total) by mouth every 8 (eight) hours as needed for nausea or vomiting.  Dispense: 10 tablet; Refill: 0    Angelena Sole, MD

## 2022-07-30 NOTE — Assessment & Plan Note (Signed)
Chronic.  Stable.  Managed by nephrology

## 2022-07-30 NOTE — Assessment & Plan Note (Signed)
Chronic.  Not controlled today.  Per patient, well-controlled at home and cardiology just adjusted her meds again.  Advised that she needs to get her cuff checked.  Continue hydralazine 50 mg twice daily, valsartan HCT 160/25 twice daily, carvedilol 25 mg twice daily

## 2022-07-30 NOTE — Assessment & Plan Note (Signed)
Chronic.  Controlled.  Continue atorvastatin 20 mg daily.  Check lipids, CMP

## 2022-07-30 NOTE — Assessment & Plan Note (Signed)
Neck.  Well-controlled.  Continue Synthroid 0.025.  Check TSH

## 2022-07-30 NOTE — Assessment & Plan Note (Signed)
Chronic.  Has been well-controlled with diet.  Check A1c, CMP, microalbumin creatinine ratio

## 2022-07-30 NOTE — Assessment & Plan Note (Signed)
Chronic.  Well-controlled.  Continue amiodarone 200 mg daily, Eliquis 5 mg twice daily.  Check CBC.  Care per cardiology

## 2022-07-31 LAB — CBC WITH DIFFERENTIAL/PLATELET
Basophils Absolute: 0.1 10*3/uL (ref 0.0–0.1)
Basophils Relative: 1.5 % (ref 0.0–3.0)
Eosinophils Absolute: 0.1 10*3/uL (ref 0.0–0.7)
Eosinophils Relative: 1.5 % (ref 0.0–5.0)
HCT: 35 % — ABNORMAL LOW (ref 36.0–46.0)
Hemoglobin: 11.6 g/dL — ABNORMAL LOW (ref 12.0–15.0)
Lymphocytes Relative: 15.6 % (ref 12.0–46.0)
Lymphs Abs: 1 10*3/uL (ref 0.7–4.0)
MCHC: 33 g/dL (ref 30.0–36.0)
MCV: 83.2 fl (ref 78.0–100.0)
Monocytes Absolute: 0.6 10*3/uL (ref 0.1–1.0)
Monocytes Relative: 9.6 % (ref 3.0–12.0)
Neutro Abs: 4.5 10*3/uL (ref 1.4–7.7)
Neutrophils Relative %: 71.8 % (ref 43.0–77.0)
Platelets: 302 10*3/uL (ref 150.0–400.0)
RBC: 4.2 Mil/uL (ref 3.87–5.11)
RDW: 15.9 % — ABNORMAL HIGH (ref 11.5–15.5)
WBC: 6.2 10*3/uL (ref 4.0–10.5)

## 2022-07-31 LAB — MICROALBUMIN / CREATININE URINE RATIO
Creatinine,U: 103.6 mg/dL
Microalb Creat Ratio: 1.6 mg/g (ref 0.0–30.0)
Microalb, Ur: 1.7 mg/dL (ref 0.0–1.9)

## 2022-07-31 LAB — LIPID PANEL
Cholesterol: 189 mg/dL (ref 0–200)
HDL: 91.4 mg/dL (ref >39.00)
LDL Cholesterol: 80 mg/dL (ref 0–99)
NonHDL: 97.36
Total CHOL/HDL Ratio: 2
Triglycerides: 85 mg/dL (ref 0.0–149.0)
VLDL: 17 mg/dL (ref 0.0–40.0)

## 2022-07-31 LAB — COMPREHENSIVE METABOLIC PANEL
ALT: 10 U/L (ref 0–35)
AST: 16 U/L (ref 0–37)
Albumin: 3.9 g/dL (ref 3.5–5.2)
Alkaline Phosphatase: 68 U/L (ref 39–117)
BUN: 25 mg/dL — ABNORMAL HIGH (ref 6–23)
CO2: 24 mEq/L (ref 19–32)
Calcium: 8.9 mg/dL (ref 8.4–10.5)
Chloride: 101 mEq/L (ref 96–112)
Creatinine, Ser: 1.82 mg/dL — ABNORMAL HIGH (ref 0.40–1.20)
GFR: 26.34 mL/min — ABNORMAL LOW (ref >60.00)
Glucose, Bld: 107 mg/dL — ABNORMAL HIGH (ref 70–99)
Potassium: 4.1 mEq/L (ref 3.5–5.1)
Sodium: 135 mEq/L (ref 135–145)
Total Bilirubin: 0.6 mg/dL (ref 0.2–1.2)
Total Protein: 6.9 g/dL (ref 6.0–8.3)

## 2022-07-31 LAB — HEMOGLOBIN A1C: Hgb A1c MFr Bld: 7.1 % — ABNORMAL HIGH (ref 4.6–6.5)

## 2022-07-31 LAB — TSH: TSH: 3.51 u[IU]/mL (ref 0.35–5.50)

## 2022-08-01 NOTE — Progress Notes (Signed)
1.  Anemia stable.  Continue supplements 2.  Sugars acceptable but needs to work on diet more. 3.  Kidney function stable Rest of labs stable

## 2022-08-07 ENCOUNTER — Ambulatory Visit (HOSPITAL_COMMUNITY)
Admission: RE | Admit: 2022-08-07 | Discharge: 2022-08-07 | Disposition: A | Discharge status: Home / Self Care | Payer: Medicare Other | Source: Ambulatory Visit | Attending: Cardiology | Admitting: Cardiology

## 2022-08-07 DIAGNOSIS — L97519 Non-pressure chronic ulcer of other part of right foot with unspecified severity: Secondary | ICD-10-CM | POA: Diagnosis not present

## 2022-08-07 DIAGNOSIS — L97518 Non-pressure chronic ulcer of other part of right foot with other specified severity: Secondary | ICD-10-CM | POA: Diagnosis not present

## 2022-08-07 LAB — VAS US ABI WITH/WO TBI

## 2022-08-14 ENCOUNTER — Ambulatory Visit: Payer: Medicare Other | Attending: Cardiovascular Disease | Admitting: Cardiovascular Disease

## 2022-08-14 VITALS — HR 67 | Ht 65.0 in | Wt 247.0 lb

## 2022-08-14 DIAGNOSIS — I10 Essential (primary) hypertension: Secondary | ICD-10-CM

## 2022-08-14 DIAGNOSIS — I2510 Atherosclerotic heart disease of native coronary artery without angina pectoris: Secondary | ICD-10-CM

## 2022-08-14 DIAGNOSIS — I480 Paroxysmal atrial fibrillation: Secondary | ICD-10-CM | POA: Diagnosis not present

## 2022-08-14 DIAGNOSIS — I739 Peripheral vascular disease, unspecified: Secondary | ICD-10-CM | POA: Diagnosis not present

## 2022-08-14 DIAGNOSIS — E785 Hyperlipidemia, unspecified: Secondary | ICD-10-CM | POA: Diagnosis not present

## 2022-08-14 DIAGNOSIS — I779 Disorder of arteries and arterioles, unspecified: Secondary | ICD-10-CM

## 2022-08-14 NOTE — Patient Instructions (Addendum)
Medication Instructions:  No changes *If you need a refill on your cardiac medications before your next appointment, please call your pharmacy*   Lab Work: None ordered If you have labs (blood work) drawn today and your tests are completely normal, you will receive your results only by: MyChart Message (if you have MyChart) OR A paper copy in the mail If you have any lab test that is abnormal or we need to change your treatment, we will call you to review the results.   Testing/Procedures: Your physician has requested that you have a peripheral vascular angiogram. This exam is performed at the hospital. During this exam IV contrast is used to look at arterial blood flow. Please review the information sheet given for details.  Follow-Up: At [facility], you and your health needs are our priority.  As part of our continuing mission to provide you with exceptional heart care, we have created designated Provider Care Teams.  These Care Teams include your primary Cardiologist (physician) and Advanced Practice Providers (APPs -  Physician Assistants and Nurse Practitioners) who all work together to provide you with the care you need, when you need it.  We recommend signing up for the patient portal called "MyChart".  Sign up information is provided on this After Visit Summary.  MyChart is used to connect with patients for Virtual Visits (Telemedicine).  Patients are able to view lab/test results, encounter notes, upcoming appointments, etc.  Non-urgent messages can be sent to your provider as well.   To learn more about what you can do with MyChart, go to [email or web address].    Your next appointment:   Keep your post procedure follow up on 09/14/22  Other Instructions  [facility] A DEPT OF [facility] AT [facility] [id number] [place] [address] Dept: [phone] Loc:  [phone]  Destiny Rice  08/14/2022  You are scheduled for a Peripheral Angiogram on Wednesday, May 29 with Dr. Lorine Bears.  1. Please arrive at the [facility] (Main Entrance A) at [facility]: [address] [place], [place] [address] at 6:30 AM (This time is 4 hour(s) before your procedure to ensure your preparation). Free valet parking service is available. You will check in at ADMITTING. The support person will be asked to wait in the waiting room.  It is OK to have someone drop you off and come back when you are ready to be discharged.    Special note: Every effort is made to have your procedure done on time. Please understand that emergencies sometimes delay scheduled procedures.  2. Diet: Do not eat solid foods after midnight.  The patient may have clear liquids until 5am upon the day of the procedure.  3. Labs: Already completed  4. Medication instructions in preparation for your procedure: Hold the Eliquis two days prior to the procedure and the morning of (hold 5/27 and 5/28) Hold the Valsartan-Hydrochlorothiazide the morning of the procedure  On the morning of your procedure, take your Aspirin 81 mg and any morning medicines NOT listed above.  You may use sips of water.  5. Plan to go home the same day, you will only stay overnight if medically necessary. 6. Bring a current list of your medications and current insurance cards. 7. You MUST have a responsible person to drive you home. 8. Someone MUST be with you the first 24 hours after you arrive home or your discharge will be  delayed. 9. Please wear clothes that are easy to get on and off and wear slip-on shoes.  Thank you for allowing [place] to care for you!   -- [facility]

## 2022-08-14 NOTE — Progress Notes (Signed)
Cardiology Office Note   Date:  08/14/2022   ID:  JAILENNE NAM, DOB Aug 06, 1945, MRN [id number]  PCP:  Jeani Sow, MD  Cardiologist: Dr. Flora Lipps  No chief complaint on file.     History of Present Illness: Destiny Rice is a 77 y.o. female who was referred by Dr. Flora Lipps for evaluation management of peripheral arterial disease. She has known history of aortic valve disease status post TAVR, paroxysmal atrial fibrillation, carotid artery disease, chronic kidney disease, hyperlipidemia and diabetes mellitus. She developed a wound on the lateral right ankle about 1 month ago and it has not healed.  She reports having an old ankle fracture with some scarring in that area.  There was no trauma this time.  She has significant discomfort in that area but has no right calf claudication. She underwent recent noninvasive vascular evaluation which showed noncompressible vessels with abnormal TBI.  Duplex on the right showed severe distal right SFA stenosis and moderate stenosis in the TP trunk.  In addition, the anterior tibial artery was occluded. She denies chest pain or worsening dyspnea.  She is not a smoker.  Past Medical History:  Diagnosis Date   AF (paroxysmal atrial fibrillation) (HCC) 03/10/2020   Allergy    Chronic kidney disease, stage 3b (HCC) 03/10/2020   Coronary artery disease    10 stents -started 2014   Hypertension    Hypothyroidism 03/10/2020   Lipoma 2014   heart   Mixed hyperlipidemia due to type 2 diabetes mellitus (HCC) 03/10/2020   PONV (postoperative nausea and vomiting)    Type 2 diabetes mellitus with stage 3b chronic kidney disease, without long-term current use of insulin (HCC) 03/10/2020    Past Surgical History:  Procedure Laterality Date   ABDOMINAL HYSTERECTOMY     and bso   CAROTID ENDARTERECTOMY Left 2021   CORONARY ANGIOPLASTY WITH STENT PLACEMENT     x10   MITRAL VALVE REPAIR  12/09/2012   TRANSCATHETER AORTIC VALVE REPLACEMENT,  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     Current Outpatient Medications  Medication Sig Dispense Refill   ALPRAZolam (XANAX) 0.25 MG tablet Take 0.25 mg by mouth at bedtime as needed for anxiety.     amiodarone (PACERONE) 200 MG tablet Take 1 tablet (200 mg total) by mouth daily. 30 tablet 10   apixaban (ELIQUIS) 5 MG TABS tablet Take 1 tablet (5 mg total) by mouth 2 (two) times daily. 60 tablet 5   atorvastatin (LIPITOR) 20 MG tablet Take 1 tablet (20 mg total) by mouth daily. 90 tablet 3   Calcium Carb-Cholecalciferol (CALCIUM 600 + D PO) Take 1 tablet by mouth daily.     carvedilol (COREG) 25 MG tablet Take 1 tablet (25 mg total) by mouth 2 (two) times daily with a meal. 180 tablet 3   cholecalciferol (VITAMIN D3) 25 MCG (1000 UNIT) tablet Take 1,000 Units by mouth daily.     Cyanocobalamin (VITAMIN B-12) 5000 MCG TBDP Take 5,000 mcg by mouth daily.     Ferrous Sulfate (IRON PO) Take 2.5 mLs by mouth in the morning and at bedtime. Liquid Iron     fluticasone (FLONASE) 50 MCG/ACT nasal spray Place 1 spray into both nostrils daily as needed for allergies or rhinitis.     folic acid (FOLVITE) 400 MCG tablet Take 400 mcg by mouth daily.     hydrALAZINE (APRESOLINE) 50 MG tablet Take 0.5 tablet (25 mg) in the morning and 0.5 tablet (25 mg) in the evening.  270 tablet 3   isosorbide mononitrate (ISMO) 20 MG tablet Take 1 tablet (20 mg total) by mouth daily. 90 tablet 3   levothyroxine (SYNTHROID) 25 MCG tablet Take 1 tablet (25 mcg total) by mouth daily before breakfast. 90 tablet 3   meclizine (ANTIVERT) 12.5 MG tablet Take 1 tablet (12.5 mg total) by mouth 3 (three) times daily as needed for dizziness. 180 tablet 1   nitroGLYCERIN (NITROSTAT) 0.4 MG SL tablet Place 1 tablet (0.4 mg total) under the tongue every 5 (five) minutes as needed for chest pain. 25 tablet 0   ondansetron (ZOFRAN-ODT) 4 MG disintegrating tablet Take 1 tablet (4 mg total) by mouth every 8 (eight) hours as needed for nausea or vomiting.  10 tablet 0   pantoprazole (PROTONIX) 40 MG tablet Take 1 tablet (40 mg total) by mouth 2 (two) times daily before a meal. 180 tablet 3   potassium chloride SA (KLOR-CON M20) 20 MEQ tablet Take 1 tablet (20 mEq total) by mouth daily. 90 tablet 3   valsartan-hydrochlorothiazide (DIOVAN-HCT) 160-25 MG tablet Take 1 tablet by mouth daily. 90 tablet 3   No current facility-administered medications for this visit.    Allergies:   Iodine-131 and Sulfa antibiotics    Social History:  The patient  reports that she has never smoked. She has never used smokeless tobacco. She reports that she does not currently use alcohol. She reports that she does not use drugs.   Family History:  The patient's family history includes Alcohol abuse in her father and mother; Hypertension in her mother and sister; Stroke in her maternal grandmother.    ROS:  Please see the history of present illness.   Otherwise, review of systems are positive for none.   All other systems are reviewed and negative.    PHYSICAL EXAM: VS:  Pulse 67   Ht 5\' 5"  (1.651 m)   Wt 247 lb (112 kg)   SpO2 98%   BMI 41.10 kg/m  , BMI Body mass index is 41.1 kg/m. GEN: Well nourished, well developed, in no acute distress  HEENT: normal  Neck: no JVD, carotid bruits, or masses Cardiac: RRR; no rubs, or gallops, 2 out of 6 systolic murmur in the aortic area.  In addition, there is 2 out of 6 decrescendo diastolic murmur. Respiratory:  clear to auscultation bilaterally, normal work of breathing GI: soft, nontender, nondistended, + BS MS: no deformity or atrophy  Skin: warm and dry, no rash Neuro:  Strength and sensation are intact Psych: euthymic mood, full affect   EKG:  EKG is not ordered today.    Recent Labs: 07/30/2022: ALT 10; BUN 25; Creatinine, Ser 1.82; Hemoglobin 11.6; Platelets 302.0; Potassium 4.1; Sodium 135; TSH 3.51    Lipid Panel    Component Value Date/Time   CHOL 189 07/30/2022 1530   CHOL 208 (H) 02/26/2022  1026   TRIG 85.0 07/30/2022 1530   HDL 91.40 07/30/2022 1530   HDL 105 02/26/2022 1026   CHOLHDL 2 07/30/2022 1530   VLDL 17.0 07/30/2022 1530   LDLCALC 80 07/30/2022 1530   LDLCALC 91 02/26/2022 1026      Wt Readings from Last 3 Encounters:  08/14/22 247 lb (112 kg)  07/30/22 250 lb 3.2 oz (113.5 kg)  07/24/22 250 lb 3.2 oz (113.5 kg)           No data to display            ASSESSMENT AND PLAN:  1.  Peripheral arterial disease: Critical limb ischemia with nonhealing ulceration in the right lateral ankle.  She does have underlying peripheral arterial disease and she is diabetic.  Both are likely contributing to slow healing.  This is a limb threatening situation.  I recommend proceeding with abdominal aortogram with lower extremity angiography and possible endovascular intervention.  I discussed the procedure in details as well as risk and benefits. 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 In addition, she has underlying chronic kidney disease with increased risk of contrast-induced nephropathy. I discussed these 2 issues with her in details.  Will plan on 4 hours of hydration and minimizing contrast.  2.  Paroxysmal atrial fibrillation: Hold Eliquis 2 days before angiogram.  3.  Coronary artery disease involving native coronary arteries without angina: Continue medical therapy.  4.  Status post TAVR and mitral valve repair: Stable on most recent echocardiogram.  5.  Hyperlipidemia: Continue treatment with atorvastatin with a target LDL of less than 70.  6.  Essential hypertension  7.  Carotid artery disease status post left CEA with known chronically occluded right carotid artery.  8.  Chronic kidney disease: This has been stable with most recent creatinine of 1.82 and GFR of 26.    Disposition:   FU with me in 1 month  Signed,  Lorine Bears, MD  08/14/2022 1:24 PM    [facility]

## 2022-08-14 NOTE — H&P (View-Only) (Signed)
  Cardiology Office Note   Date:  08/14/2022   ID:  Destiny Rice, DOB 09/03/1945, MRN [id number]  PCP:  Kulik, Ann Marie, MD  Cardiologist: Dr. O. Neal  No chief complaint on file.     History of Present Illness: Destiny Rice is a 77 y.o. female who was referred by Dr. [name] for evaluation management of peripheral arterial disease. She has known history of aortic valve disease status post TAVR, paroxysmal atrial fibrillation, carotid artery disease, chronic kidney disease, hyperlipidemia and diabetes mellitus. She developed a wound on the lateral right ankle about 1 month ago and it has not healed.  She reports having an old ankle fracture with some scarring in that area.  There was no trauma this time.  She has significant discomfort in that area but has no right calf claudication. She underwent recent noninvasive vascular evaluation which showed noncompressible vessels with abnormal TBI.  Duplex on the right showed severe distal right SFA stenosis and moderate stenosis in the TP trunk.  In addition, the anterior tibial artery was occluded. She denies chest pain or worsening dyspnea.  She is not a smoker.  Past Medical History:  Diagnosis Date   AF (paroxysmal atrial fibrillation) (HCC) 03/10/2020   Allergy    Chronic kidney disease, stage 3b (HCC) 03/10/2020   Coronary artery disease    10 stents -started 2014   Hypertension    Hypothyroidism 03/10/2020   Lipoma 2014   heart   Mixed hyperlipidemia due to type 2 diabetes mellitus (HCC) 03/10/2020   PONV (postoperative nausea and vomiting)    Type 2 diabetes mellitus with stage 3b chronic kidney disease, without long-term current use of insulin (HCC) 03/10/2020    Past Surgical History:  Procedure Laterality Date   ABDOMINAL HYSTERECTOMY     and bso   CAROTID ENDARTERECTOMY Left 2021   CORONARY ANGIOPLASTY WITH STENT PLACEMENT     x10   MITRAL VALVE REPAIR  12/09/2012   TRANSCATHETER AORTIC VALVE REPLACEMENT,  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     Current Outpatient Medications  Medication Sig Dispense Refill   ALPRAZolam (XANAX) 0.25 MG tablet Take 0.25 mg by mouth at bedtime as needed for anxiety.     amiodarone (PACERONE) 200 MG tablet Take 1 tablet (200 mg total) by mouth daily. 30 tablet 10   apixaban (ELIQUIS) 5 MG TABS tablet Take 1 tablet (5 mg total) by mouth 2 (two) times daily. 60 tablet 5   atorvastatin (LIPITOR) 20 MG tablet Take 1 tablet (20 mg total) by mouth daily. 90 tablet 3   Calcium Carb-Cholecalciferol (CALCIUM 600 + D PO) Take 1 tablet by mouth daily.     carvedilol (COREG) 25 MG tablet Take 1 tablet (25 mg total) by mouth 2 (two) times daily with a meal. 180 tablet 3   cholecalciferol (VITAMIN D3) 25 MCG (1000 UNIT) tablet Take 1,000 Units by mouth daily.     Cyanocobalamin (VITAMIN B-12) 5000 MCG TBDP Take 5,000 mcg by mouth daily.     Ferrous Sulfate (IRON PO) Take 2.5 mLs by mouth in the morning and at bedtime. Liquid Iron     fluticasone (FLONASE) 50 MCG/ACT nasal spray Place 1 spray into both nostrils daily as needed for allergies or rhinitis.     folic acid (FOLVITE) 400 MCG tablet Take 400 mcg by mouth daily.     hydrALAZINE (APRESOLINE) 50 MG tablet Take 0.5 tablet (25 mg) in the morning and 0.5 tablet (25 mg) in the evening.   270 tablet 3   isosorbide mononitrate (ISMO) 20 MG tablet Take 1 tablet (20 mg total) by mouth daily. 90 tablet 3   levothyroxine (SYNTHROID) 25 MCG tablet Take 1 tablet (25 mcg total) by mouth daily before breakfast. 90 tablet 3   meclizine (ANTIVERT) 12.5 MG tablet Take 1 tablet (12.5 mg total) by mouth 3 (three) times daily as needed for dizziness. 180 tablet 1   nitroGLYCERIN (NITROSTAT) 0.4 MG SL tablet Place 1 tablet (0.4 mg total) under the tongue every 5 (five) minutes as needed for chest pain. 25 tablet 0   ondansetron (ZOFRAN-ODT) 4 MG disintegrating tablet Take 1 tablet (4 mg total) by mouth every 8 (eight) hours as needed for nausea or vomiting.  10 tablet 0   pantoprazole (PROTONIX) 40 MG tablet Take 1 tablet (40 mg total) by mouth 2 (two) times daily before a meal. 180 tablet 3   potassium chloride SA (KLOR-CON M20) 20 MEQ tablet Take 1 tablet (20 mEq total) by mouth daily. 90 tablet 3   valsartan-hydrochlorothiazide (DIOVAN-HCT) 160-25 MG tablet Take 1 tablet by mouth daily. 90 tablet 3   No current facility-administered medications for this visit.    Allergies:   Iodine-131 and Sulfa antibiotics    Social History:  The patient  reports that she has never smoked. She has never used smokeless tobacco. She reports that she does not currently use alcohol. She reports that she does not use drugs.   Family History:  The patient's family history includes Alcohol abuse in her father and mother; Hypertension in her mother and sister; Stroke in her maternal grandmother.    ROS:  Please see the history of present illness.   Otherwise, review of systems are positive for none.   All other systems are reviewed and negative.    PHYSICAL EXAM: VS:  Pulse 67   Ht 5' 5" (1.651 m)   Wt 247 lb (112 kg)   SpO2 98%   BMI 41.10 kg/m  , BMI Body mass index is 41.1 kg/m. GEN: Well nourished, well developed, in no acute distress  HEENT: normal  Neck: no JVD, carotid bruits, or masses Cardiac: RRR; no rubs, or gallops, 2 out of 6 systolic murmur in the aortic area.  In addition, there is 2 out of 6 decrescendo diastolic murmur. Respiratory:  clear to auscultation bilaterally, normal work of breathing GI: soft, nontender, nondistended, + BS MS: no deformity or atrophy  Skin: warm and dry, no rash Neuro:  Strength and sensation are intact Psych: euthymic mood, full affect   EKG:  EKG is not ordered today.    Recent Labs: 07/30/2022: ALT 10; BUN 25; Creatinine, Ser 1.82; Hemoglobin 11.6; Platelets 302.0; Potassium 4.1; Sodium 135; TSH 3.51    Lipid Panel    Component Value Date/Time   CHOL 189 07/30/2022 1530   CHOL 208 (H) 02/26/2022  1026   TRIG 85.0 07/30/2022 1530   HDL 91.40 07/30/2022 1530   HDL 105 02/26/2022 1026   CHOLHDL 2 07/30/2022 1530   VLDL 17.0 07/30/2022 1530   LDLCALC 80 07/30/2022 1530   LDLCALC 91 02/26/2022 1026      Wt Readings from Last 3 Encounters:  08/14/22 247 lb (112 kg)  07/30/22 250 lb 3.2 oz (113.5 kg)  07/24/22 250 lb 3.2 oz (113.5 kg)           No data to display            ASSESSMENT AND PLAN:  1.    Peripheral arterial disease: Critical limb ischemia with nonhealing ulceration in the right lateral ankle.  She does have underlying peripheral arterial disease and she is diabetic.  Both are likely contributing to slow healing.  This is a limb threatening situation.  I recommend proceeding with abdominal aortogram with lower extremity angiography and possible endovascular intervention.  I discussed the procedure in details as well as risk and benefits. She is a Jehovah's Witness and does not accept blood products. In addition, she has underlying chronic kidney disease with increased risk of contrast-induced nephropathy. I discussed these 2 issues with her in details.  Will plan on 4 hours of hydration and minimizing contrast.  2.  Paroxysmal atrial fibrillation: Hold Eliquis 2 days before angiogram.  3.  Coronary artery disease involving native coronary arteries without angina: Continue medical therapy.  4.  Status post TAVR and mitral valve repair: Stable on most recent echocardiogram.  5.  Hyperlipidemia: Continue treatment with atorvastatin with a target LDL of less than 70.  6.  Essential hypertension  7.  Carotid artery disease status post left CEA with known chronically occluded right carotid artery.  8.  Chronic kidney disease: This has been stable with most recent creatinine of 1.82 and GFR of 26.    Disposition:   FU with me in 1 month  Signed,  Bethzaida Boord, MD  08/14/2022 1:24 PM    [facility] 

## 2022-08-21 ENCOUNTER — Telehealth: Payer: Self-pay | Admitting: *Deleted

## 2022-08-21 ENCOUNTER — Telehealth: Payer: Self-pay | Admitting: Cardiovascular Disease

## 2022-08-21 NOTE — Telephone Encounter (Signed)
I called patient, she was advised to do what procedure nurse advised. Patient concerned with holding medications, and taking the aspirin, however I did discuss with her this was just for the procedure and to do what Thurston Hole had advised on not taking it that morning a decision could be made after arrival. Patient verbalized understanding, thankful for call back.

## 2022-08-21 NOTE — Telephone Encounter (Signed)
Patient called back and asked to speak with Raynelle Fanning, Dr Marylene Buerger nurse.  I had instructed patient to take aspirin 81 mg tomorrow morning prior to going to hospital for procedure. Patient reported to me she has been told not to take aspirin in the past because of past treatment with coumadin and now Eliquis. I have explained to patient this is a one time dose tomorrow morning, then after procedure tomorrow she will be given instructions on whether she should continue aspirin regularly since she will be back on Eliquis after procedure. I told patient she does not have to take aspirin 81 mg in the morning before leaving home and a decision can be made after arrival at the hospital tomorrow morning about whether she should be given aspirin before procedure.   Patient would like a call from Raynelle Fanning to discuss this with her.

## 2022-08-21 NOTE — Telephone Encounter (Signed)
Please disregard

## 2022-08-21 NOTE — Telephone Encounter (Signed)
Entry error

## 2022-08-21 NOTE — Telephone Encounter (Addendum)
Abdominal aortogram scheduled at [facility] for: Wednesday Aug 22, 2022 10:30 AM Arrival time [facility] Main Entrance A at: 6 AM-pre-procedure hydration  Nothing to eat after midnight prior to procedure, clear liquids until 5 AM day of procedure.  CONTRAST ALLERGY-no  Pt reports she has had coronary stents placed in past and denies allergic reaction to contrast dye used when stents were placed. Patient reports she had stress test (sounds like nuclear stress test) and states she was nauseated (denies allergic reaction) after injection used for test.  Medication instructions: -Hold:  Eliquis-none 08/20/22 until post procedure  Valsartan/HCT/KCl-day before and day of procedure-per protocol GFR 26-pt feels she needs to take medication day before for BP control -Other usual morning medications can be taken with sips of water including aspirin 81 mg (pt requests to be given aspirin at hospital so she does not have to buy)  Confirmed patient has responsible adult to drive home post procedure and be with patient first 24 hours after arriving home.  Plan to go home the same day, you will only stay overnight if medically necessary.  Reviewed procedure instructions with patient.

## 2022-08-21 NOTE — Telephone Encounter (Signed)
Patient is calling

## 2022-08-22 ENCOUNTER — Encounter (HOSPITAL_COMMUNITY)
Admission: RE | Disposition: A | Discharge status: Home / Self Care | Payer: Self-pay | Source: Ambulatory Visit | Attending: Cardiovascular Disease

## 2022-08-22 ENCOUNTER — Ambulatory Visit (HOSPITAL_COMMUNITY)
Admission: RE | Admit: 2022-08-22 | Discharge: 2022-08-22 | Disposition: A | Discharge status: Home / Self Care | Payer: Medicare Other | Source: Ambulatory Visit | Attending: Cardiovascular Disease | Admitting: Cardiovascular Disease

## 2022-08-22 DIAGNOSIS — L97319 Non-pressure chronic ulcer of right ankle with unspecified severity: Secondary | ICD-10-CM | POA: Insufficient documentation

## 2022-08-22 DIAGNOSIS — E782 Mixed hyperlipidemia: Secondary | ICD-10-CM | POA: Diagnosis not present

## 2022-08-22 DIAGNOSIS — I70233 Atherosclerosis of native arteries of right leg with ulceration of ankle: Secondary | ICD-10-CM | POA: Insufficient documentation

## 2022-08-22 DIAGNOSIS — I2510 Atherosclerotic heart disease of native coronary artery without angina pectoris: Secondary | ICD-10-CM | POA: Diagnosis not present

## 2022-08-22 DIAGNOSIS — Z8249 Family history of ischemic heart disease and other diseases of the circulatory system: Secondary | ICD-10-CM | POA: Insufficient documentation

## 2022-08-22 DIAGNOSIS — E1151 Type 2 diabetes mellitus with diabetic peripheral angiopathy without gangrene: Secondary | ICD-10-CM | POA: Insufficient documentation

## 2022-08-22 DIAGNOSIS — Z952 Presence of prosthetic heart valve: Secondary | ICD-10-CM | POA: Insufficient documentation

## 2022-08-22 DIAGNOSIS — E11621 Type 2 diabetes mellitus with foot ulcer: Secondary | ICD-10-CM | POA: Diagnosis not present

## 2022-08-22 DIAGNOSIS — I739 Peripheral vascular disease, unspecified: Secondary | ICD-10-CM

## 2022-08-22 DIAGNOSIS — E1122 Type 2 diabetes mellitus with diabetic chronic kidney disease: Secondary | ICD-10-CM | POA: Insufficient documentation

## 2022-08-22 DIAGNOSIS — N1832 Chronic kidney disease, stage 3b: Secondary | ICD-10-CM | POA: Insufficient documentation

## 2022-08-22 DIAGNOSIS — I129 Hypertensive chronic kidney disease with stage 1 through stage 4 chronic kidney disease, or unspecified chronic kidney disease: Secondary | ICD-10-CM | POA: Insufficient documentation

## 2022-08-22 DIAGNOSIS — Z7901 Long term (current) use of anticoagulants: Secondary | ICD-10-CM | POA: Diagnosis not present

## 2022-08-22 DIAGNOSIS — I480 Paroxysmal atrial fibrillation: Secondary | ICD-10-CM | POA: Diagnosis not present

## 2022-08-22 HISTORY — PX: PERIPHERAL INTRAVASCULAR LITHOTRIPSY: CATH118324

## 2022-08-22 HISTORY — PX: PERIPHERAL VASCULAR BALLOON ANGIOPLASTY: CATH118281

## 2022-08-22 HISTORY — PX: ABDOMINAL AORTOGRAM W/LOWER EXTREMITY: CATH118223

## 2022-08-22 LAB — GLUCOSE, CAPILLARY: Glucose-Capillary: 125 mg/dL — ABNORMAL HIGH (ref 70–99)

## 2022-08-22 LAB — POCT ACTIVATED CLOTTING TIME
Activated Clotting Time: 266 seconds
Activated Clotting Time: 266 seconds

## 2022-08-22 LAB — NO BLOOD PRODUCTS

## 2022-08-22 SURGERY — ABDOMINAL AORTOGRAM W/LOWER EXTREMITY
Anesthesia: LOCAL | Laterality: Right

## 2022-08-22 MED ORDER — SODIUM CHLORIDE 0.9 % IV SOLN: INTRAVENOUS | Status: AC

## 2022-08-22 MED ORDER — SODIUM CHLORIDE 0.9% FLUSH: 3.0000 mL | INTRAVENOUS | Status: DC | PRN

## 2022-08-22 MED ORDER — ONDANSETRON HCL 4 MG/2ML IJ SOLN
INTRAMUSCULAR | Status: AC
  Filled 2022-08-22: qty 2

## 2022-08-22 MED ORDER — MIDAZOLAM HCL 2 MG/2ML IJ SOLN
INTRAMUSCULAR | Status: DC | PRN
  Administered 2022-08-22: 1 mg via INTRAVENOUS

## 2022-08-22 MED ORDER — HYDRALAZINE HCL 20 MG/ML IJ SOLN
5.0000 mg | INTRAMUSCULAR | Status: AC | PRN
  Administered 2022-08-22 (×2): 5 mg via INTRAVENOUS
  Filled 2022-08-22: qty 1

## 2022-08-22 MED ORDER — SODIUM CHLORIDE 0.9% FLUSH: 3.0000 mL | Freq: Two times a day (BID) | INTRAVENOUS | Status: DC

## 2022-08-22 MED ORDER — CLOPIDOGREL BISULFATE 300 MG PO TABS
ORAL_TABLET | ORAL | Status: DC | PRN
  Administered 2022-08-22: 300 mg via ORAL

## 2022-08-22 MED ORDER — ASPIRIN 81 MG PO CHEW: 81.0000 mg | CHEWABLE_TABLET | ORAL | Status: DC

## 2022-08-22 MED ORDER — PROTAMINE SULFATE 10 MG/ML IV SOLN
INTRAVENOUS | Status: DC | PRN
  Administered 2022-08-22: 15 mg via INTRAVENOUS
  Administered 2022-08-22: 5 mg via INTRAVENOUS

## 2022-08-22 MED ORDER — FENTANYL CITRATE (PF) 100 MCG/2ML IJ SOLN
INTRAMUSCULAR | Status: AC
  Filled 2022-08-22: qty 2

## 2022-08-22 MED ORDER — SODIUM CHLORIDE 0.9 % IV SOLN: 250.0000 mL | INTRAVENOUS | Status: DC | PRN

## 2022-08-22 MED ORDER — SODIUM CHLORIDE 0.9 % IV SOLN: INTRAVENOUS | Status: DC

## 2022-08-22 MED ORDER — PROTAMINE SULFATE 10 MG/ML IV SOLN
INTRAVENOUS | Status: AC
  Filled 2022-08-22: qty 5

## 2022-08-22 MED ORDER — FENTANYL CITRATE (PF) 100 MCG/2ML IJ SOLN
INTRAMUSCULAR | Status: DC | PRN
  Administered 2022-08-22: 25 ug via INTRAVENOUS

## 2022-08-22 MED ORDER — MIDAZOLAM HCL 2 MG/2ML IJ SOLN
INTRAMUSCULAR | Status: AC
  Filled 2022-08-22: qty 2

## 2022-08-22 MED ORDER — CARVEDILOL 12.5 MG PO TABS
25.0000 mg | ORAL_TABLET | Freq: Two times a day (BID) | ORAL | Status: DC
  Administered 2022-08-22 (×2): 25 mg via ORAL
  Filled 2022-08-22 (×2): qty 2

## 2022-08-22 MED ORDER — ACETAMINOPHEN 325 MG PO TABS: 650.0000 mg | ORAL_TABLET | ORAL | Status: DC | PRN

## 2022-08-22 MED ORDER — CLOPIDOGREL BISULFATE 300 MG PO TABS
300.0000 mg | ORAL_TABLET | Freq: Once | ORAL | Status: AC
  Administered 2022-08-22: 300 mg via ORAL
  Filled 2022-08-22: qty 1

## 2022-08-22 MED ORDER — ONDANSETRON HCL 4 MG/2ML IJ SOLN
INTRAMUSCULAR | Status: DC | PRN
  Administered 2022-08-22: 4 mg via INTRAVENOUS
  Administered 2022-08-22: 4 mg

## 2022-08-22 MED ORDER — HEPARIN (PORCINE) IN NACL 1000-0.9 UT/500ML-% IV SOLN
INTRAVENOUS | Status: DC | PRN
  Administered 2022-08-22 (×2): 500 mL

## 2022-08-22 MED ORDER — HEPARIN SODIUM (PORCINE) 1000 UNIT/ML IJ SOLN
INTRAMUSCULAR | Status: AC
  Filled 2022-08-22: qty 10

## 2022-08-22 MED ORDER — HYDRALAZINE HCL 20 MG/ML IJ SOLN
INTRAMUSCULAR | Status: DC | PRN
  Administered 2022-08-22 (×2): 10 mg via INTRAVENOUS

## 2022-08-22 MED ORDER — CLOPIDOGREL BISULFATE 75 MG PO TABS: 75.0000 mg | ORAL_TABLET | Freq: Every day | ORAL | 3 refills | Status: DC

## 2022-08-22 MED ORDER — CLOPIDOGREL BISULFATE 300 MG PO TABS
ORAL_TABLET | ORAL | Status: AC
  Filled 2022-08-22: qty 1

## 2022-08-22 MED ORDER — ISOSORBIDE MONONITRATE 20 MG PO TABS
20.0000 mg | ORAL_TABLET | Freq: Once | ORAL | Status: AC
  Administered 2022-08-22: 20 mg via ORAL
  Filled 2022-08-22: qty 1

## 2022-08-22 MED ORDER — HEPARIN SODIUM (PORCINE) 1000 UNIT/ML IJ SOLN
INTRAMUSCULAR | Status: DC | PRN
  Administered 2022-08-22: 2000 [IU] via INTRAVENOUS
  Administered 2022-08-22: 8000 [IU] via INTRAVENOUS

## 2022-08-22 MED ORDER — LIDOCAINE HCL (PF) 1 % IJ SOLN
INTRAMUSCULAR | Status: AC
  Filled 2022-08-22: qty 30

## 2022-08-22 MED ORDER — HYDRALAZINE HCL 20 MG/ML IJ SOLN
INTRAMUSCULAR | Status: AC
  Filled 2022-08-22: qty 1

## 2022-08-22 MED ORDER — LIDOCAINE HCL (PF) 1 % IJ SOLN
INTRAMUSCULAR | Status: DC | PRN
  Administered 2022-08-22: 15 mL

## 2022-08-22 MED ORDER — IODIXANOL 320 MG/ML IV SOLN
INTRAVENOUS | Status: DC | PRN
  Administered 2022-08-22: 75 mL

## 2022-08-22 SURGICAL SUPPLY — 26 items
BALLN IN.PACT DCB 4X60 (BALLOONS) ×2
BALLN IN.PACT DCB 5X150 (BALLOONS) ×2
CATH ANGIO 5F PIGTAIL 65CM (CATHETERS) IMPLANT
CATH CROSS OVER TEMPO 5F (CATHETERS) IMPLANT
CATH QUICKCROSS .018X135CM (MICROCATHETER) IMPLANT
CATH SHOCKWAVE 4.5X60 (CATHETERS) IMPLANT
CATH STRAIGHT 5FR 65CM (CATHETERS) IMPLANT
DCB IN.PACT 4X60 (BALLOONS) IMPLANT
DCB IN.PACT 5X150 (BALLOONS) IMPLANT
KIT ANGIASSIST CO2 SYSTEM (KITS) IMPLANT
KIT ENCORE 26 ADVANTAGE (KITS) IMPLANT
KIT MICROPUNCTURE NIT STIFF (SHEATH) IMPLANT
KIT PV (KITS) ×2 IMPLANT
SHEATH CATAPULT 7FR 60 (SHEATH) IMPLANT
SHEATH PINNACLE 5F 10CM (SHEATH) IMPLANT
SHEATH PINNACLE 7F 10CM (SHEATH) IMPLANT
SHEATH PROBE COVER 6X72 (BAG) IMPLANT
STOPCOCK MORSE 400PSI 3WAY (MISCELLANEOUS) IMPLANT
SYR MEDRAD MARK 7 150ML (SYRINGE) ×2 IMPLANT
TAPE SHOOT N SEE (TAPE) IMPLANT
TRANSDUCER W/STOPCOCK (MISCELLANEOUS) ×2 IMPLANT
TRAY PV CATH (CUSTOM PROCEDURE TRAY) ×2 IMPLANT
TUBING CIL FLEX 10 FLL-RA (TUBING) IMPLANT
WIRE HITORQ VERSACORE ST 145CM (WIRE) IMPLANT
WIRE RUNTHROUGH .014X300CM (WIRE) IMPLANT
WIRE SHEPHERD 4G .014 (WIRE) IMPLANT

## 2022-08-22 NOTE — Progress Notes (Signed)
Raised pts HOB to 30 degrees, while asking pt what she wanted to drink, pt stated that she felt like she was peeing,but knew that she wasn't. Checked pt's left groin site, bleeding noted. Pressure held for 20 min, bleeding stopped, new dressing applied. Will maintain pts HOB flat for 1 additional hour. Will continue to monitor.

## 2022-08-22 NOTE — Progress Notes (Signed)
Patient has an allergy to dye that causes nausea. Per Janne Napoleon, RN Kirke Corin, MD is aware. No further orders at this time.

## 2022-08-22 NOTE — Interval H&P Note (Signed)
History and Physical Interval Note:  08/22/2022 11:20 AM  Destiny Rice  has presented today for surgery, with the diagnosis of PAD.  The various methods of treatment have been discussed with the patient and family. After consideration of risks, benefits and other options for treatment, the patient has consented to  Procedure(s): ABDOMINAL AORTOGRAM W/LOWER EXTREMITY (N/A) as a surgical intervention.  The patient's history has been reviewed, patient examined, no change in status, stable for surgery.  I have reviewed the patient's chart and labs.  Questions were answered to the patient's satisfaction.     Lorine Bears

## 2022-08-23 ENCOUNTER — Encounter (HOSPITAL_COMMUNITY): Payer: Self-pay | Admitting: Cardiovascular Disease

## 2022-08-29 ENCOUNTER — Telehealth: Payer: Self-pay | Admitting: Cardiovascular Disease

## 2022-08-29 MED ORDER — HYDRALAZINE HCL 50 MG PO TABS: ORAL_TABLET | ORAL | 3 refills | Status: DC

## 2022-08-29 NOTE — Telephone Encounter (Signed)
*  STAT* If patient is at the pharmacy, call can be transferred to refill team.   1. Which medications need to be refilled? (please list name of each medication and dose if known)   hydrALAZINE (APRESOLINE) 50 MG tablet    2. Which pharmacy/location (including street and city if local pharmacy) is medication to be sent to?  [id number] - [place], [place] - [address] AT [facility] OF [address]      3. Do they need a 30 day or 90 day supply? 90 day   Pt is currently out of medication

## 2022-08-29 NOTE — Telephone Encounter (Signed)
Pt's medication was sent to pt's pharmacy as requested. Confirmation received.  °

## 2022-08-29 NOTE — Telephone Encounter (Signed)
Patient is requesting to speak with Dr. Flora Lipps or nurse in regards to her medications that was sent. Please call back to discuss

## 2022-08-29 NOTE — Telephone Encounter (Signed)
Patient called and stated the hydralazine she picked up from the pharmacy is incorrect. She no longer is taking 50mg  (0.5 tablet ) twice a day she is taking 50 mg 1 tablet twice a day. She also feels the hydralazine she picked up from the pharmacy is not correct because the pill is white and her pills was pink. Did inform her that depending on the manufacturer medication comes in different color shapes and sizes. If she is unsure do nit take them and return to pharmacy to ask about the medication she picked up.   I see in January you increased her dose to 50mg  TID, and in in April decreased to 25mg  TID. Can you clarify dosage because patient thinks she is supposed to take 50mg  BID.    She also has concerns about the plavix that she was prescribed after her procedure. She stated it say take for 9 days but she was dispensed 90 tablets. I do not see discharge instructions from Dr. Kirke Corin. She also wanted to know when she can resume activity being that she bled out 3 times. She would like to know what are her restrictions.

## 2022-08-30 NOTE — Telephone Encounter (Signed)
Patient has plavix 90 already ordered.   Dr. Flora Lipps she states taking her hydralazine 25mg  TID does not work her blood pressure would still be elevated.  She also says 50mg  TID is too much and lowers her blood pressure too much. She has been taking 50mg  BID and she states that is what is working for her.

## 2022-08-31 NOTE — Telephone Encounter (Signed)
Attempted to contact patient, she did not answer, phone hung up.  Will try again later.

## 2022-09-05 NOTE — Telephone Encounter (Signed)
Called patient, advised of message below.   Patient thankful for call back. Verbalized understanding. She will update [place] if she has any issues with her blood pressure.

## 2022-09-06 ENCOUNTER — Ambulatory Visit (HOSPITAL_COMMUNITY)
Admission: RE | Admit: 2022-09-06 | Discharge: 2022-09-06 | Disposition: A | Discharge status: Home / Self Care | Payer: Medicare Other | Source: Ambulatory Visit | Attending: Internal Medicine | Admitting: Internal Medicine

## 2022-09-06 DIAGNOSIS — I779 Disorder of arteries and arterioles, unspecified: Secondary | ICD-10-CM | POA: Diagnosis not present

## 2022-09-06 DIAGNOSIS — R42 Dizziness and giddiness: Secondary | ICD-10-CM | POA: Insufficient documentation

## 2022-09-11 ENCOUNTER — Other Ambulatory Visit (HOSPITAL_COMMUNITY): Payer: Self-pay | Admitting: Cardiovascular Disease

## 2022-09-11 DIAGNOSIS — Z9862 Peripheral vascular angioplasty status: Secondary | ICD-10-CM

## 2022-09-11 DIAGNOSIS — I739 Peripheral vascular disease, unspecified: Secondary | ICD-10-CM

## 2022-09-14 ENCOUNTER — Encounter: Payer: Self-pay | Admitting: Physician Assistant

## 2022-09-14 ENCOUNTER — Ambulatory Visit (HOSPITAL_COMMUNITY)
Admission: RE | Admit: 2022-09-14 | Discharge: 2022-09-14 | Disposition: A | Discharge status: Home / Self Care | Payer: Medicare Other | Source: Ambulatory Visit | Attending: Cardiovascular Disease | Admitting: Cardiovascular Disease

## 2022-09-14 ENCOUNTER — Ambulatory Visit: Payer: Medicare Other | Admitting: Physician Assistant

## 2022-09-14 VITALS — BP 168/80 | HR 80 | Ht 65.0 in | Wt 248.0 lb

## 2022-09-14 DIAGNOSIS — I10 Essential (primary) hypertension: Secondary | ICD-10-CM | POA: Insufficient documentation

## 2022-09-14 DIAGNOSIS — I2510 Atherosclerotic heart disease of native coronary artery without angina pectoris: Secondary | ICD-10-CM | POA: Insufficient documentation

## 2022-09-14 DIAGNOSIS — Z9862 Peripheral vascular angioplasty status: Secondary | ICD-10-CM | POA: Diagnosis not present

## 2022-09-14 DIAGNOSIS — E119 Type 2 diabetes mellitus without complications: Secondary | ICD-10-CM | POA: Diagnosis not present

## 2022-09-14 DIAGNOSIS — E785 Hyperlipidemia, unspecified: Secondary | ICD-10-CM | POA: Diagnosis not present

## 2022-09-14 DIAGNOSIS — I480 Paroxysmal atrial fibrillation: Secondary | ICD-10-CM | POA: Insufficient documentation

## 2022-09-14 DIAGNOSIS — Z952 Presence of prosthetic heart valve: Secondary | ICD-10-CM

## 2022-09-14 DIAGNOSIS — Z9889 Other specified postprocedural states: Secondary | ICD-10-CM | POA: Diagnosis not present

## 2022-09-14 DIAGNOSIS — I739 Peripheral vascular disease, unspecified: Secondary | ICD-10-CM | POA: Diagnosis not present

## 2022-09-14 LAB — VAS US ABI WITH/WO TBI

## 2022-09-14 NOTE — Patient Instructions (Signed)
Medication Instructions:   Your physician recommends that you continue on your current medications as directed. Please refer to the Current Medication list given to you today.  *If you need a refill on your cardiac medications before your next appointment, please call your pharmacy*  Lab Work: NONE ordered at this time of appointment   If you have labs (blood work) drawn today and your tests are completely normal, you will receive your results only by: MyChart Message (if you have MyChart) OR A paper copy in the mail If you have any lab test that is abnormal or we need to change your treatment, we will call you to review the results.  Testing/Procedures: Your physician has requested that you have an ankle brachial index (ABI). During this test an ultrasound and blood pressure cuff are used to evaluate the arteries that supply the arms and legs with blood. Allow thirty minutes for this exam. There are no restrictions or special instructions.  Your physician has requested that you have a lower extremity arterial exercise duplex. During this test, exercise and ultrasound are used to evaluate arterial blood flow in the legs. Allow one hour for this exam. There are no restrictions or special instructions.  Follow-Up: At [facility], you and your health needs are our priority.  As part of our continuing mission to provide you with exceptional heart care, we have created designated Provider Care Teams.  These Care Teams include your primary Cardiologist (physician) and Advanced Practice Providers (APPs -  Physician Assistants and Nurse Practitioners) who all work together to provide you with the care you need, when you need it.  We recommend signing up for the patient portal called "MyChart".  Sign up information is provided on this After Visit Summary.  MyChart is used to connect with patients for Virtual Visits (Telemedicine).  Patients are able to view lab/test results, encounter notes,  upcoming appointments, etc.  Non-urgent messages can be sent to your provider as well.   To learn more about what you can do with MyChart, go to [email or web address].    Your next appointment:   In 6 month(s) after Ultrasounds performed   Provider:   Dr Kirke Corin      Other Instructions

## 2022-09-14 NOTE — Progress Notes (Signed)
Cardiology Office Note:  .   Date:  09/16/2022  ID:  Destiny Rice, DOB [birth year], MRN [id number] PCP: Jeani Sow, MD  [facility] Providers Cardiologist:  Reatha Harps, MD     History of Present Illness: Marland Kitchen   Destiny Rice is a 77 y.o. 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  Last echocardiogram obtained on 04/23/2022 showed EF 60 to 65%, mitral valve replaced, trivial MR, replaced aortic valve with mean gradient 12 mmHg.  Patient was recently seen by Dr. Flora Lipps in April 2024 at which time she was noted to have poor healing wound in the right lower extremity.  ABI was ordered which was performed on 08/07/2022 which showed monophasic blood flow in bilateral lower extremity.  She was seen by Dr. Kirke Corin on 08/14/2022 at which time she described a 1 month development of lateral right ankle wound.  Lower extremity angiography performed on 08/22/2022 demonstrated no significant aortoiliac disease, severe stenosis in the right distal SFA with significant disease in the TP trunk and proximal peroneal artery which is the only patent vessel below the knee, peroneal artery reconstitutes the dorsalis pedis and posterior tibial via communicating branches.  Patient underwent successful intravascular lithotripsy and drug-coated balloon angioplasty of the right peroneal artery/TP trunk as well as the right distal SFA.  Postprocedure, patient was placed on combination of Plavix and Eliquis for 3 months.  She had carotid ultrasound on 6/13 which showed 1 to 39% right ICA disease, no significant disease in the left carotid artery.  Patient presents today for follow-up accompanied by husband and son.  Since the recent study, her right lower extremity wound has healed.  She does complain of pain behind her knee, however there was no bruising on exam.  Today's vascular  test shows significant improvement in the blood flow on the right side.  She denies any significant claudication symptoms on the left side.  I recommended repeat study in 6 months prior to follow-up with Dr. Kirke Corin.   ROS:   She denies chest pain, palpitations, dyspnea, pnd, orthopnea, n, v, dizziness, syncope, edema, weight gain, or early satiety. All other systems reviewed and are otherwise negative except as noted above.    Studies Reviewed: .        Cardiac Studies & Procedures       ECHOCARDIOGRAM  ECHOCARDIOGRAM COMPLETE 04/23/2022  Narrative ECHOCARDIOGRAM REPORT    Patient Name:   Destiny Rice Date of Exam: 04/23/2022 Medical Rec #:  [id number]      Height:       65.0 in Accession #:    [id number]     Weight:       251.8 lb Date of Birth:  05-20-1945      BSA:          2.182 m Patient Age:    77 years       BP:           169/71 mmHg Patient Gender: F              HR:           63 bpm. Exam Location:  Outpatient  Procedure: 2D Echo, 3D Echo, Color Doppler, Cardiac Doppler and Strain Analysis  Indications:    Shortness of breath  History:        Patient has prior history of Echocardiogram  examinations, most recent 05/06/2020. CAD, Arrythmias:Atrial Fibrillation; Risk Factors:Hypertension, Diabetes, Dyslipidemia and Non-Smoker. Coronary artery disease10 stents -started 2014; S/p 26mm Evolut Pro aortic valve replacement 04/24/2018; 26mm Edwards annuloplasty ring 12/09/2012.  Sonographer:    Jeryl Columbia RDCS Referring Phys: [id number] Ronnald Ramp [name]  IMPRESSIONS   1. Left ventricular ejection fraction, by estimation, is 60 to 65%. The left ventricle has normal function. The left ventricle has no regional wall motion abnormalities. There is moderate concentric left ventricular hypertrophy. Left ventricular diastolic parameters are indeterminate. 2. Right ventricular systolic function is moderately reduced. The right ventricular size is mildly enlarged. 3. Status  post mitral annuloplasty ring. The mitral valve has been repaired/replaced. Trivial mitral valve regurgitation. No evidence of mitral stenosis. 4. The aortic valve has been repaired/replaced. Aortic valve regurgitation is not visualized. No aortic stenosis is present. Aortic valve mean gradient measures 12.0 mmHg. Aortic valve Vmax measures 2.41 m/s. 5. The inferior vena cava is normal in size with greater than 50% respiratory variability, suggesting right atrial pressure of 3 mmHg.  FINDINGS Left Ventricle: Left ventricular ejection fraction, by estimation, is 60 to 65%. The left ventricle has normal function. The left ventricle has no regional wall motion abnormalities. The left ventricular internal cavity size was normal in size. There is moderate concentric left ventricular hypertrophy. Left ventricular diastolic function could not be evaluated due to mitral valve repair. Left ventricular diastolic parameters are indeterminate. Indeterminate filling pressures.  Right Ventricle: The right ventricular size is mildly enlarged. No increase in right ventricular wall thickness. Right ventricular systolic function is moderately reduced.  Left Atrium: Left atrial size was normal in size.  Right Atrium: Right atrial size was normal in size.  Pericardium: There is no evidence of pericardial effusion.  Mitral Valve: Status post mitral annuloplasty ring. The mitral valve has been repaired/replaced. Trivial mitral valve regurgitation. No evidence of mitral valve stenosis. MV peak gradient, 17.5 mmHg. The mean mitral valve gradient is 7.0 mmHg.  Tricuspid Valve: The tricuspid valve is normal in structure. Tricuspid valve regurgitation is mild . No evidence of tricuspid stenosis.  Aortic Valve: The aortic valve has been repaired/replaced. Aortic valve regurgitation is not visualized. No aortic stenosis is present. Aortic valve mean gradient measures 12.0 mmHg. Aortic valve peak gradient measures 23.2 mmHg.  Aortic valve area, by VTI measures 0.66 cm. There is a 26 mm CoreValve-Evolut Pro prosthetic, stented (TAVR) valve present in the aortic position.  Pulmonic Valve: The pulmonic valve was not well visualized. Pulmonic valve regurgitation is not visualized. No evidence of pulmonic stenosis.  Aorta: The aortic root is normal in size and structure.  Venous: The inferior vena cava is normal in size with greater than 50% respiratory variability, suggesting right atrial pressure of 3 mmHg.  IAS/Shunts: No atrial level shunt detected by color flow Doppler.   LEFT VENTRICLE PLAX 2D LVIDd:         4.21 cm   Diastology LVIDs:         2.37 cm   LV e' medial:    4.05 cm/s LV PW:         1.30 cm   LV E/e' medial:  48.4 LV IVS:        1.23 cm   LV e' lateral:   4.95 cm/s LVOT diam:     1.70 cm   LV E/e' lateral: 39.6 LV SV:         33 LV SV Index:   15 LVOT Area:  2.27 cm   RIGHT VENTRICLE RV Basal diam:  5.11 cm RV Mid diam:    3.65 cm RV S prime:     6.08 cm/s TAPSE (M-mode): 1.2 cm  LEFT ATRIUM             Index        RIGHT ATRIUM           Index LA diam:        5.60 cm 2.57 cm/m   RA Area:     15.40 cm LA Vol (A2C):   36.0 ml 16.50 ml/m  RA Volume:   43.90 ml  20.12 ml/m LA Vol (A4C):   49.8 ml 22.83 ml/m LA Biplane Vol: 42.4 ml 19.44 ml/m AORTIC VALVE AV Area (Vmax):    0.59 cm AV Area (Vmean):   0.59 cm AV Area (VTI):     0.66 cm AV Vmax:           241.00 cm/s AV Vmean:          157.000 cm/s AV VTI:            0.505 m AV Peak Grad:      23.2 mmHg AV Mean Grad:      12.0 mmHg LVOT Vmax:         63.10 cm/s LVOT Vmean:        40.700 cm/s LVOT VTI:          0.147 m LVOT/AV VTI ratio: 0.29  AORTA Ao Root diam: 2.00 cm Ao Asc diam:  2.90 cm  MITRAL VALVE                TRICUSPID VALVE MV Area (PHT): 2.99 cm     TR Peak grad:   37.2 mmHg MV Area VTI:   0.53 cm     TR Vmax:        305.00 cm/s MV Peak grad:  17.5 mmHg MV Mean grad:  7.0 mmHg     SHUNTS MV  Vmax:       2.09 m/s     Systemic VTI:  0.15 m MV Vmean:      129.0 cm/s   Systemic Diam: 1.70 cm MV Decel Time: 254 msec MV E velocity: 196.00 cm/s MV A velocity: 109.00 cm/s MV E/A ratio:  1.80  Kardie Tobb DO Electronically signed by Thomasene Ripple DO Signature Date/Time: 04/23/2022/4:25:22 PM    Final             Risk Assessment/Calculations:    CHA2DS2-VASc Score = 6   This indicates a 9.7% annual risk of stroke. The patient's score is based upon: CHF History: 0 HTN History: 1 Diabetes History: 1 Stroke History: 0 Vascular Disease History: 1 Age Score: 2 Gender Score: 1           Physical Exam:   VS:  BP (!) 168/80   Pulse 80   Ht 5\' 5"  (1.651 m)   Wt 248 lb (112.5 kg)   BMI 41.27 kg/m    Wt Readings from Last 3 Encounters:  09/14/22 248 lb (112.5 kg)  08/22/22 246 lb 14.6 oz (112 kg)  08/14/22 247 lb (112 kg)    GEN: Well nourished, well developed in no acute distress NECK: No JVD; No carotid bruits CARDIAC: RRR, no murmurs, rubs, gallops RESPIRATORY:  Clear to auscultation without rales, wheezing or rhonchi  ABDOMEN: Soft, non-tender, non-distended EXTREMITIES:  No edema; No deformity   ASSESSMENT AND PLAN: .  PAD: Patient underwent intravascular lithotripsy and drug-coated balloon angioplasty of the right peroneal artery/TP trunk and the right distal SFA.  Vascular ultrasound obtained today showed improved blood flow down the right leg.  Will obtain repeat vascular ultrasound in 6 months prior to follow-up with Dr. Kirke Corin.   CAD: Denies any chest pain  PAF: On Eliquis.  On amiodarone and carvedilol  Hypertension: Blood pressure elevated today, it appears, patient has chronically elevated blood pressure.  She says her blood pressures normal at home, however elevated every time she comes to the doctor's office.  Hyperlipidemia: On Lipitor  DM2: Followed by PCP  History of mitral valve repair: Stable on last echocardiogram in January 2024  History  of TAVR: No significant stenosis or regurgitation on the recent echocardiogram in January 2024.  Stable TAVR        Dispo: Follow-up with Dr. Kirke Corin in 6 months after repeat study.  Signed, Azalee Course, PA

## 2022-09-19 LAB — VAS US ABI WITH/WO TBI

## 2022-09-20 ENCOUNTER — Telehealth: Payer: Self-pay | Admitting: Cardiovascular Disease

## 2022-09-20 NOTE — Telephone Encounter (Signed)
She states that her hydralazine pill is to small to split in half. The pharmacy changed manufacturer so the pill is smaller.   BP- was 168/80 at office visit on 6/21 but she stated her machine says her BP was 130/34  I am not sure if her machine is correct. She has one she place on her wrist.  6/22- 120/37 hr 58 6/23- 117/42 hr 60 6/24- 130/34 hr 51 6/25- 127/40 hr 56 6/26- 130/37 hr 56 6/27- 120/37 hr 61  After further discussion patient has been taking her plavix twice a day. We discussed her medications and how she is to take all her medications. She verbalized understanding. She repeated the information back to me. Would you like to order any labs for her.

## 2022-09-20 NOTE — Telephone Encounter (Signed)
Pt wants to speak with a different nurse. Pt states the previous call was not helpful and needs help from someone else. Please advise.

## 2022-09-20 NOTE — Telephone Encounter (Signed)
Pt c/o medication issue:  1. Name of Medication: clopidogrel (PLAVIX) 75 MG tablet   2. How are you currently taking this medication (dosage and times per day)?  Take 1 tablet (75 mg total) by mouth daily.       3. Are you having a reaction (difficulty breathing--STAT)? No  4. What is your medication issue? Pt is requesting a callback regarding this medication causing her to feel very sick when she takes it in the morning. Please advise

## 2022-09-20 NOTE — Telephone Encounter (Signed)
Pt seen 09/14/22 by Lisabeth Devoid, PA: Patient underwent successful intravascular lithotripsy and drug-coated balloon angioplasty of the right peroneal artery/TP trunk as well as the right distal SFA. Postprocedure, patient was placed on combination of Plavix and Eliquis for 3 months. She had carotid ultrasound on 6/13 which showed 1 to 39% right ICA disease, no significant disease in the left carotid artery.   August 29, 2022 [name], Ronnald Ramp, MD  to Judene Companion, LPN  Darene Lamer, LPN  Iran Ouch, MD  Sandi Mariscal, RN    08/29/22  8:55 PM She is supposed to be on plavix for 3 months, please prescribe this.   Returned call to patient who states since starting the Clopidogrel she has been dizzy and doesn't have an appetite. 6/27 120/37 HR61 6/26 117/30    **patient was very upset upon my return call, wouldn't let me complete a sentence and was insistent that she did NOT see Lisabeth Devoid, PA here on 6/21, she saw "another doctor." Tried to get her BP readings for the last 6 days (since being seen) and she wouldn't supply them. Repeatedly stating, I wasn't helping her. I calmly explained that I needed the answers so that I could help her. She reports a diastolic in the 30's both days. Questioned if she meant 61's which got her very upset again. She loudly exclaimed "just forget it" and hung up the phone. Will route to [facility] to see if he feels the diastolic readings could be legitimate and what should be done to address.

## 2022-09-20 NOTE — Telephone Encounter (Signed)
Patient is stating she feels the plavix is causing her blood pressure to get low. I did advise plavix does not lower your blood pressure. She got upset and stated we are not listening to her, who are we to tell her what is happening. I was not able to get any BP readings. She states its also making her a little dizzy. She does not want to take plavix anymore. Advised to continue taking the plavix.   Patient did call the beginning of the month about her hydralazine instead of her taking 25 mg TID she is taking 50mg  BID. I was not really able to assess further because she was upset when I called. She stated we just need to tell you what her symptoms are and call her back after we speak to you.

## 2022-09-21 NOTE — Telephone Encounter (Signed)
Spoke to patient . She verbalized understanding information from yesterday.

## 2022-09-24 ENCOUNTER — Other Ambulatory Visit: Payer: Self-pay | Admitting: *Deleted

## 2022-10-17 ENCOUNTER — Telehealth: Payer: Self-pay | Admitting: Cardiovascular Disease

## 2022-10-17 MED ORDER — HYDRALAZINE HCL 50 MG PO TABS: 50.0000 mg | ORAL_TABLET | Freq: Two times a day (BID) | ORAL | 3 refills | Status: DC

## 2022-10-17 NOTE — Telephone Encounter (Signed)
Pt's medication was sent to pt's pharmacy as requested. Confirmation received.  °

## 2022-10-17 NOTE — Telephone Encounter (Signed)
*  STAT* If patient is at the pharmacy, call can be transferred to refill team.   1. Which medications need to be refilled? (please list name of each medication and dose if known) hydrALAZINE (APRESOLINE) 50 MG tablet    2. Which pharmacy/location (including street and city if local pharmacy) is medication to be sent to?  [id number] - [place], [place] - [address] AT [facility] [address]      3. Do they need a 30 day or 90 day supply? 90 day   Pt is completely out of medication

## 2022-10-19 DIAGNOSIS — L603 Nail dystrophy: Secondary | ICD-10-CM | POA: Diagnosis not present

## 2022-10-19 DIAGNOSIS — E1142 Type 2 diabetes mellitus with diabetic polyneuropathy: Secondary | ICD-10-CM | POA: Diagnosis not present

## 2022-10-19 DIAGNOSIS — E1151 Type 2 diabetes mellitus with diabetic peripheral angiopathy without gangrene: Secondary | ICD-10-CM | POA: Diagnosis not present

## 2022-10-19 DIAGNOSIS — I739 Peripheral vascular disease, unspecified: Secondary | ICD-10-CM | POA: Diagnosis not present

## 2022-11-09 ENCOUNTER — Telehealth: Payer: Self-pay | Admitting: Cardiovascular Disease

## 2022-11-09 NOTE — Telephone Encounter (Signed)
Called left message for patient .  Appointment schedule  for 11/15/22 at 8:30 am   Requested patient to call back to confirm appt

## 2022-11-09 NOTE — Telephone Encounter (Signed)
Patient refuses to go to Urgent care.  She refuses to go to ED.  She refuses to see any APP or DOD .  She will only see her doctor.  She is very rude and argument and will not let me talk with her.  At this time noted I would send the message to the doctor

## 2022-11-09 NOTE — Telephone Encounter (Signed)
Patient is calling because she has been having a hard time breathing for a weak now. Patient states it keeps getting worse. The patient stated she woke up to both of her hands feeling numb. Patient states she has been experiencing pain in both arms randomly.

## 2022-11-09 NOTE — Telephone Encounter (Signed)
Pt called in regards to her breathing. Hard time breathing for a week and pt is SOB. Pt has dizziness and does have vertigo. Pt has headache, lightheadedness and blurred vision. Pt is not having any chest pain or pressure but does have pain down both arms with numbness and tingling. She wants to know what to do. Swelling in bilateral legs and during the day it's worse. Pt had surgery on both legs 2 months ago. Please advise.

## 2022-11-13 NOTE — Progress Notes (Unsigned)
Cardiology Office Note:   Date:  11/15/2022  NAME:  Destiny Rice    MRN: [id number] DOB:  04/30/1945   PCP:  Jeani Sow, MD  Cardiologist:  Reatha Harps, MD  Electrophysiologist:  None   Referring MD: Jeani Sow, MD   Chief Complaint  Patient presents with   Shortness of Breath    Walking any distance causes pt sob    History of Present Illness:   Destiny Rice is a 77 y.o. female with below history who presents for follow-up.  She reports for the past 1 to 2 weeks she has had cough and congestion as well as shortness of breath with activity.  She reports when she does activity she does get quite winded.  Husband also has upper respiratory symptoms.  Weights are down.  No signs of volume overload.  Lungs are clear.  She does sound congested and seems to have an upper respiratory infection.  She is diabetic and likely would need an antibiotic.  Blood pressure elevated but she did not take her medications today.  She is trying to eat and drink well.  She reports no chest pain.  Just shortness of breath and cough and congestion.  Vitals are stable.  CV exam is stable.  EKG shows sinus rhythm.  Symptoms are consistent with likely URI.  Problem List 1. CAD -prior PCI 2. Cardiac tumor 3. Mitral Valve Repair -26 mm Edwards annuloplasty ring -12/09/2012 4. Severe aortic stenosis s/p 26 mm Evolut Pro -04/24/2018 4. Paroxysmal Afib 5. Carotid Artery Disease s/p L CEA -R ICA 100% 6. HTN 7. DM -A1c 7.0 8. HLD -T chol 208, HDL 105, LDL 91, TG 66 9. 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 PAD/critical limb ischemia -angioplasty R peroneal/tibial artery  Past Medical History: Past Medical History:  Diagnosis Date   AF (paroxysmal atrial fibrillation) (HCC) 03/10/2020   Allergy    Chronic kidney disease, stage 3b (HCC) 03/10/2020   Coronary artery disease    10 stents -started 2014   Hypertension    Hypothyroidism 03/10/2020   Lipoma 2014   heart   Mixed hyperlipidemia due to  type 2 diabetes mellitus (HCC) 03/10/2020   PONV (postoperative nausea and vomiting)    Type 2 diabetes mellitus with stage 3b chronic kidney disease, without long-term current use of insulin (HCC) 03/10/2020    Past Surgical History: Past Surgical History:  Procedure Laterality Date   ABDOMINAL AORTOGRAM W/LOWER EXTREMITY N/A 08/22/2022   Procedure: ABDOMINAL AORTOGRAM W/LOWER EXTREMITY;  Surgeon: Iran Ouch, MD;  Location: [facility];  Service: Cardiovascular;  Laterality: N/A;   ABDOMINAL HYSTERECTOMY     and bso   CAROTID ENDARTERECTOMY Left 2021   CORONARY ANGIOPLASTY WITH STENT PLACEMENT     x10   MITRAL VALVE REPAIR  12/09/2012   PERIPHERAL INTRAVASCULAR LITHOTRIPSY Right 08/22/2022   Procedure: PERIPHERAL INTRAVASCULAR LITHOTRIPSY;  Surgeon: Iran Ouch, MD;  Location: [facility];  Service: Cardiovascular;  Laterality: Right;  R SFA, TP trunk and PT   PERIPHERAL VASCULAR BALLOON ANGIOPLASTY Right 08/22/2022   Procedure: PERIPHERAL VASCULAR BALLOON ANGIOPLASTY;  Surgeon: Iran Ouch, MD;  Location: [facility];  Service: Cardiovascular;  Laterality: Right;  R SFA, TP trunk and PT   TRANSCATHETER AORTIC VALVE REPLACEMENT, 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    Current Medications: Current Meds  Medication Sig   ALPRAZolam (XANAX) 0.25 MG tablet Take 0.25 mg by mouth at bedtime as needed for anxiety.  amiodarone (PACERONE) 200 MG tablet Take 1 tablet (200 mg total) by mouth daily.   apixaban (ELIQUIS) 5 MG TABS tablet Take 1 tablet (5 mg total) by mouth 2 (two) times daily.   atorvastatin (LIPITOR) 20 MG tablet Take 1 tablet (20 mg total) by mouth daily.   azithromycin (ZITHROMAX Z-PAK) 250 MG tablet Take 2 tablets on day one Take 1 tablet day 2-5   benzonatate (TESSALON PERLES) 100 MG capsule Take 1 capsule (100 mg total) by mouth 3 (three) times daily as needed for cough.   Calcium Carb-Cholecalciferol (CALCIUM 600 + D PO) Take 1 tablet by mouth  daily.   carvedilol (COREG) 25 MG tablet Take 1 tablet (25 mg total) by mouth 2 (two) times daily with a meal.   cholecalciferol (VITAMIN D3) 25 MCG (1000 UNIT) tablet Take 1,000 Units by mouth daily.   Cyanocobalamin (VITAMIN B-12) 5000 MCG TBDP Take 5,000 mcg by mouth daily.   Ferrous Sulfate (IRON PO) Take 2.5 mLs by mouth in the morning and at bedtime. Liquid Iron   fluticasone (FLONASE) 50 MCG/ACT nasal spray Place 1 spray into both nostrils daily as needed for allergies or rhinitis.   folic acid (FOLVITE) 400 MCG tablet Take 400 mcg by mouth daily.   hydrALAZINE (APRESOLINE) 50 MG tablet Take 1 tablet (50 mg total) by mouth 2 (two) times daily.   isosorbide mononitrate (ISMO) 20 MG tablet Take 1 tablet (20 mg total) by mouth daily.   levothyroxine (SYNTHROID) 25 MCG tablet Take 1 tablet (25 mcg total) by mouth daily before breakfast.   meclizine (ANTIVERT) 12.5 MG tablet Take 1 tablet (12.5 mg total) by mouth 3 (three) times daily as needed for dizziness.   nitroGLYCERIN (NITROSTAT) 0.4 MG SL tablet Place 1 tablet (0.4 mg total) under the tongue every 5 (five) minutes as needed for chest pain.   ondansetron (ZOFRAN-ODT) 4 MG disintegrating tablet Take 1 tablet (4 mg total) by mouth every 8 (eight) hours as needed for nausea or vomiting.   pantoprazole (PROTONIX) 40 MG tablet Take 1 tablet (40 mg total) by mouth 2 (two) times daily before a meal.   potassium chloride SA (KLOR-CON M20) 20 MEQ tablet Take 1 tablet (20 mEq total) by mouth daily.   valsartan-hydrochlorothiazide (DIOVAN-HCT) 160-25 MG tablet Take 1 tablet by mouth daily.     Allergies:    Iodine-131 and Sulfa antibiotics   Social History: Social History   Socioeconomic History   Marital status: Married    Spouse name: Not on file   Number of children: 1   Years of education: Not on file   Highest education level: Not on file  Occupational History   Occupation: retired  Tobacco Use   Smoking status: Never   Smokeless  tobacco: Never  Vaping Use   Vaping status: Never Used  Substance and Sexual Activity   Alcohol use: Not Currently   Drug use: Never   Sexual activity: Not Currently  Other Topics Concern   Not on file  Social History Narrative   Son   4 grands, greats 12      Retired housekeeping.   Social Determinants of Health   Financial Resource Strain: Low Risk  (06/12/2022)   Overall Financial Resource Strain (CARDIA)    Difficulty of Paying Living Expenses: Not hard at all  Food Insecurity: No Food Insecurity (06/12/2022)   Hunger Vital Sign    Worried About Running Out of Food in the Last Year: Never true  Ran Out of Food in the Last Year: Never true  Transportation Needs: No Transportation Needs (06/12/2022)   PRAPARE - Administrator, Civil Service (Medical): No    Lack of Transportation (Non-Medical): No  Physical Activity: Insufficiently Active (06/12/2022)   Exercise Vital Sign    Days of Exercise per Week: 5 days    Minutes of Exercise per Session: 20 min  Stress: No Stress Concern Present (06/12/2022)   [employer] of Occupational Health - Occupational Stress Questionnaire    Feeling of Stress : Not at all  Social Connections: Socially Integrated (06/12/2022)   Social Connection and Isolation Panel [NHANES]    Frequency of Communication with Friends and Family: More than three times a week    Frequency of Social Gatherings with Friends and Family: Once a week    Attends Religious Services: More than 4 times per year    Active Member of [employer] or Organizations: Yes    Attends Banker Meetings: 1 to 4 times per year    Marital Status: Married     Family History: The patient's family history includes Alcohol abuse in her father and mother; Hypertension in her mother and sister; Stroke in her maternal grandmother.  ROS:   All other ROS reviewed and negative. Pertinent positives noted in the HPI.     EKGs/Labs/Other Studies Reviewed:   The  following studies were personally reviewed by me today:  EKG:  EKG is ordered today.    EKG Interpretation Date/Time:  Thursday November 15 2022 84:16:60 EDT Ventricular Rate:  61 PR Interval:  274 QRS Duration:  106 QT Interval:  472 QTC Calculation: 475 R Axis:   94  Text Interpretation: Sinus rhythm with 1st degree A-V block Rightward axis Nonspecific ST and T wave abnormality Prolonged QT Confirmed by Lennie Odor ([id number]) on 11/15/2022 8:31:27 AM   Recent Labs: 07/30/2022: ALT 10; BUN 25; Creatinine, Ser 1.82; Hemoglobin 11.6; Platelets 302.0; Potassium 4.1; Sodium 135; TSH 3.51   Recent Lipid Panel    Component Value Date/Time   CHOL 189 07/30/2022 1530   CHOL 208 (H) 02/26/2022 1026   TRIG 85.0 07/30/2022 1530   HDL 91.40 07/30/2022 1530   HDL 105 02/26/2022 1026   CHOLHDL 2 07/30/2022 1530   VLDL 17.0 07/30/2022 1530   LDLCALC 80 07/30/2022 1530   LDLCALC 91 02/26/2022 1026    Physical Exam:   VS:  BP (!) 154/88 (BP Location: Left Arm, Patient Position: Sitting, Cuff Size: Large)   Pulse 61   Ht 5\' 5"  (1.651 m)   Wt 243 lb 3.2 oz (110.3 kg)   SpO2 96%   BMI 40.47 kg/m    Wt Readings from Last 3 Encounters:  11/15/22 243 lb 3.2 oz (110.3 kg)  09/14/22 248 lb (112.5 kg)  08/22/22 246 lb 14.6 oz (112 kg)    General: Well nourished, well developed, in no acute distress Head: Atraumatic, normal size  Eyes: PEERLA, EOMI  Neck: Supple, no JVD Endocrine: No thryomegaly Cardiac: Normal S1, S2; RRR; no murmurs, rubs, or gallops Lungs: Clear to auscultation bilaterally, no wheezing, rhonchi or rales  Abd: Soft, nontender, no hepatomegaly  Ext: No edema, diminished pulses, wound on the right lateral malleolus is healing Musculoskeletal: No deformities, BUE and BLE strength normal and equal Skin: Warm and dry, no rashes   Neuro: Alert and oriented to person, place, time, and situation, CNII-XII grossly intact, no focal deficits  Psych: Normal mood and affect  ASSESSMENT:   OMELIA VERMILYEA is a 77 y.o. female who presents for the following: 1. Upper respiratory tract infection, unspecified type   2. PAD (peripheral artery disease) (HCC)   3. Coronary artery disease involving native coronary artery of native heart without angina pectoris   4. Hyperlipidemia LDL goal <70   5. PAF (paroxysmal atrial fibrillation) (HCC)   6. Essential hypertension   7. S/P mitral valve repair   8. S/P TAVR (transcatheter aortic valve replacement)   9. Renovascular hypertension   10. Chronic kidney disease, stage 3b (HCC)     PLAN:   1. Upper respiratory tract infection, unspecified type -Symptoms are suspicious for upper respiratory infection.  She is diabetic.  We will prescribe a Z-Pak.  We discussed Tessalon Perles for cough.  She will take Coricidin as a decongestant given her high blood pressure.  I recommended she eat and drink well.  Her husband also has symptoms.  We will hold on a chest x-ray.  If symptoms do not improve she will need this.  Hopefully the antibiotic will cover any potential lower respiratory infection.  Her examination is quite normal today.  No signs of volume overload.  We will check lab work to make sure she does not have a leukocytosis.  I will also check her kidney profile given slight elevation in creatinine a few months ago.  2. PAD (peripheral artery disease) (HCC) 3. Coronary artery disease involving native coronary artery of native heart without angina pectoris 4. Hyperlipidemia LDL goal <70 -Found to have critical limb ischemia.  Status post intervention to the right lower extremity.  Ulceration is healing.  She will continue Eliquis for now.  Does not appear to be on any antiplatelets.  Likely okay.  New recommendations are discouraging long-term aspirin or DAPT.  No symptoms of angina.  Most recent LDL cholesterol 80.  We will continue with current dose of Lipitor.  Likely would benefit from getting her LDL less than 70.  We can  work on this but right now she is battling an upper respiratory infection.  5. PAF (paroxysmal atrial fibrillation) (HCC) -No recurrent.  On amiodarone.  On Eliquis.  6. Essential hypertension -Elevated today.  Did not take medications.  She has battling a URI.  She will take her medications when she gets home.  7. S/P mitral valve repair 8. S/P TAVR (transcatheter aortic valve replacement) -Stable mitral valve repair and TAVR.  No signs of volume overload.  Continue current medications.  9. Chronic kidney disease, stage 3b (HCC) -Recheck BMP today.  Disposition: Return if symptoms worsen or fail to improve.  Medication Adjustments/Labs and Tests Ordered: Current medicines are reviewed at length with the patient today.  Concerns regarding medicines are outlined above.  Orders Placed This Encounter  Procedures   CBC   Basic metabolic panel   EKG 12-Lead   Meds ordered this encounter  Medications   benzonatate (TESSALON PERLES) 100 MG capsule    Sig: Take 1 capsule (100 mg total) by mouth 3 (three) times daily as needed for cough.    Dispense:  20 capsule    Refill:  0   azithromycin (ZITHROMAX Z-PAK) 250 MG tablet    Sig: Take 2 tablets on day one Take 1 tablet day 2-5    Dispense:  6 each    Refill:  0   Patient Instructions  Medication Instructions:  Z- Pack- Take 2 tablets on day one. Take 1 tablet on days 2-5. Tessalon Perrels  100 mg- take 3 times daily as needed for cough Coricidin HBP- take according to manufacturer's instructions *If you need a refill on your cardiac medications before your next appointment, please call your pharmacy*   Lab Work: CBC, BMP If you have labs (blood work) drawn today and your tests are completely normal, you will receive your results only by: MyChart Message (if you have MyChart) OR A paper copy in the mail If you have any lab test that is abnormal or we need to change your treatment, we will call you to review the  results.  Follow-Up: At [facility], you and your health needs are our priority.  As part of our continuing mission to provide you with exceptional heart care, we have created designated Provider Care Teams.  These Care Teams include your primary Cardiologist (physician) and Advanced Practice Providers (APPs -  Physician Assistants and Nurse Practitioners) who all work together to provide you with the care you need, when you need it.  We recommend signing up for the patient portal called "MyChart".  Sign up information is provided on this After Visit Summary.  MyChart is used to connect with patients for Virtual Visits (Telemedicine).  Patients are able to view lab/test results, encounter notes, upcoming appointments, etc.  Non-urgent messages can be sent to your provider as well.   To learn more about what you can do with MyChart, go to [email or web address].    Your next appointment:    01/24/23 AT 2:00  Provider:   Reatha Harps, MD       Time Spent with Patient: I have spent a total of 35 minutes with patient reviewing hospital notes, telemetry, EKGs, labs and examining the patient as well as establishing an assessment and plan that was discussed with the patient.  > 50% of time was spent in direct patient care.  Signed, Lenna Gilford. Flora Lipps, MD, [facility]  [facility]  [address], [address] [place], [place] [address] [phone]  11/15/2022 8:52 AM

## 2022-11-13 NOTE — Telephone Encounter (Signed)
Patient is requesting call back to discuss this appt that was scheduled for her.

## 2022-11-15 ENCOUNTER — Encounter: Payer: Self-pay | Admitting: Cardiovascular Disease

## 2022-11-15 ENCOUNTER — Ambulatory Visit: Payer: Medicare Other | Attending: Cardiovascular Disease | Admitting: Cardiovascular Disease

## 2022-11-15 VITALS — BP 154/88 | HR 61 | Ht 65.0 in | Wt 243.2 lb

## 2022-11-15 DIAGNOSIS — I150 Renovascular hypertension: Secondary | ICD-10-CM

## 2022-11-15 DIAGNOSIS — I739 Peripheral vascular disease, unspecified: Secondary | ICD-10-CM

## 2022-11-15 DIAGNOSIS — N1832 Chronic kidney disease, stage 3b: Secondary | ICD-10-CM

## 2022-11-15 DIAGNOSIS — I10 Essential (primary) hypertension: Secondary | ICD-10-CM | POA: Diagnosis not present

## 2022-11-15 DIAGNOSIS — J069 Acute upper respiratory infection, unspecified: Secondary | ICD-10-CM | POA: Diagnosis not present

## 2022-11-15 DIAGNOSIS — I2510 Atherosclerotic heart disease of native coronary artery without angina pectoris: Secondary | ICD-10-CM

## 2022-11-15 DIAGNOSIS — I480 Paroxysmal atrial fibrillation: Secondary | ICD-10-CM

## 2022-11-15 DIAGNOSIS — Z9889 Other specified postprocedural states: Secondary | ICD-10-CM

## 2022-11-15 DIAGNOSIS — E785 Hyperlipidemia, unspecified: Secondary | ICD-10-CM

## 2022-11-15 DIAGNOSIS — Z952 Presence of prosthetic heart valve: Secondary | ICD-10-CM

## 2022-11-15 MED ORDER — BENZONATATE 100 MG PO CAPS: 100.0000 mg | ORAL_CAPSULE | Freq: Three times a day (TID) | ORAL | 0 refills | Status: DC | PRN

## 2022-11-15 MED ORDER — AZITHROMYCIN 250 MG PO TABS: ORAL_TABLET | ORAL | 0 refills | Status: DC

## 2022-11-15 NOTE — Patient Instructions (Addendum)
Medication Instructions:  Z- Pack- Take 2 tablets on day one. Take 1 tablet on days 2-5. Tessalon Perrels 100 mg- take 3 times daily as needed for cough Coricidin HBP- take according to manufacturer's instructions *If you need a refill on your cardiac medications before your next appointment, please call your pharmacy*   Lab Work: CBC, BMP If you have labs (blood work) drawn today and your tests are completely normal, you will receive your results only by: MyChart Message (if you have MyChart) OR A paper copy in the mail If you have any lab test that is abnormal or we need to change your treatment, we will call you to review the results.  Follow-Up: At [facility], you and your health needs are our priority.  As part of our continuing mission to provide you with exceptional heart care, we have created designated Provider Care Teams.  These Care Teams include your primary Cardiologist (physician) and Advanced Practice Providers (APPs -  Physician Assistants and Nurse Practitioners) who all work together to provide you with the care you need, when you need it.  We recommend signing up for the patient portal called "MyChart".  Sign up information is provided on this After Visit Summary.  MyChart is used to connect with patients for Virtual Visits (Telemedicine).  Patients are able to view lab/test results, encounter notes, upcoming appointments, etc.  Non-urgent messages can be sent to your provider as well.   To learn more about what you can do with MyChart, go to [email or web address].    Your next appointment:    01/24/23 AT 2:00  Provider:   Reatha Harps, MD

## 2022-11-16 LAB — BASIC METABOLIC PANEL
BUN/Creatinine Ratio: 13 (ref 12–28)
BUN: 20 mg/dL (ref 8–27)
CO2: 23 mmol/L (ref 20–29)
Calcium: 9.1 mg/dL (ref 8.7–10.3)
Chloride: 101 mmol/L (ref 96–106)
Creatinine, Ser: 1.6 mg/dL — ABNORMAL HIGH (ref 0.57–1.00)
Glucose: 136 mg/dL — ABNORMAL HIGH (ref 70–99)
Potassium: 4.1 mmol/L (ref 3.5–5.2)
Sodium: 140 mmol/L (ref 134–144)
eGFR: 33 mL/min/{1.73_m2} — ABNORMAL LOW (ref >59)

## 2022-11-16 LAB — CBC
Hematocrit: 34.3 % (ref 34.0–46.6)
Hemoglobin: 10.8 g/dL — ABNORMAL LOW (ref 11.1–15.9)
MCH: 26.5 pg — ABNORMAL LOW (ref 26.6–33.0)
MCHC: 31.5 g/dL (ref 31.5–35.7)
MCV: 84 fL (ref 79–97)
Platelets: 285 10*3/uL (ref 150–450)
RBC: 4.07 x10E6/uL (ref 3.77–5.28)
RDW: 13.9 % (ref 11.7–15.4)
WBC: 7.1 10*3/uL (ref 3.4–10.8)

## 2022-11-20 ENCOUNTER — Telehealth: Payer: Self-pay

## 2022-11-20 ENCOUNTER — Telehealth: Payer: Self-pay | Admitting: Cardiovascular Disease

## 2022-11-20 NOTE — Telephone Encounter (Signed)
Returned call to Destiny Rice. Destiny Rice states that she has the virus Dr. Flora Lipps had and she states that he told her to call if she isn't feeling any better. Destiny Rice states she feels worse and wants Dr. Marylene Buerger advice on what to do next. Please advise.

## 2022-11-20 NOTE — Telephone Encounter (Signed)
Spoke to patient lab results given.Stated she is feeling alittle better.She continues to have a cough.She has appointment with PCP tomorrow. Stated her husband went to pharmacy today he picked up Clopidogrel prescription.Stated she no longer takes.She is taking Eliquis instead.Pharmacist wanted her to ask Dr.[name] to make sure she no longer takes.

## 2022-11-20 NOTE — Telephone Encounter (Signed)
Returned to pt with Dr. Jenene Slicker recommendation below. Pt verbalized understanding. Pt states her family doctor will not see her. Recommended to pt to go to the ER. Pt states she is not going to the hospital either. Pt states I ain't going to the ER. "I made my decision that I will stay here and die". "I know I am not going make it". "Dr. Flora Lipps is the only one who can help me".     I think she should see her PCP. I tried an antibiotic but it did not seem to work. I just have limited recommendations as her cardiologist.   Gerri Spore T. Flora Lipps, MD, [facility]

## 2022-11-20 NOTE — Telephone Encounter (Signed)
Pt states she has the new virus that is out and that the doctor told her to callback if she was not feeling better. Please advise

## 2022-11-21 ENCOUNTER — Encounter: Payer: Self-pay | Admitting: Family

## 2022-11-21 ENCOUNTER — Telehealth: Payer: Self-pay | Admitting: Cardiovascular Disease

## 2022-11-21 ENCOUNTER — Ambulatory Visit (INDEPENDENT_AMBULATORY_CARE_PROVIDER_SITE_OTHER): Payer: Medicare Other | Admitting: Family

## 2022-11-21 VITALS — BP 182/92 | HR 70 | Temp 97.7°F | Ht 65.0 in | Wt 244.6 lb

## 2022-11-21 DIAGNOSIS — R053 Chronic cough: Secondary | ICD-10-CM

## 2022-11-21 DIAGNOSIS — J069 Acute upper respiratory infection, unspecified: Secondary | ICD-10-CM

## 2022-11-21 MED ORDER — BENZONATATE 100 MG PO CAPS
100.0000 mg | ORAL_CAPSULE | Freq: Three times a day (TID) | ORAL | 0 refills | Status: DC | PRN
Start: 2022-11-21 — End: 2022-11-28

## 2022-11-21 MED ORDER — AMOXICILLIN-POT CLAVULANATE 875-125 MG PO TABS
1.0000 | ORAL_TABLET | Freq: Two times a day (BID) | ORAL | 0 refills | Status: DC
Start: 2022-11-21 — End: 2023-01-24

## 2022-11-21 NOTE — Telephone Encounter (Signed)
Called pt and let her know an order for a chest xray has been placed for [facility]. Pt verbalized understanding.

## 2022-11-21 NOTE — Progress Notes (Signed)
Patient ID: Destiny Rice, female    DOB: 02/18/1946, 77 y.o.   MRN: [id number]  Chief Complaint  Patient presents with   Cough    HPI:      Persistent cough:    pt present with husband, she was seen by Cardiology last Thursday after coughing for a few days, given Zpack and Tessalon pearles, labs done and advised to get a CXR, but she could not do that same day & states her cardiologist told her to f/u with her PCP office. She reports she has started feeling better, but is till coughing and concerned about pneumonia & requesting a PCN shot. Reports having PNA a long time ago and has had 2 PNA vaccines and covid vaccines. She was tested negative for covid since these sx started.  Assessment & Plan:  1. Persistent cough- lungs with mild rhonchi in upper lobes. Advised pt go get the CXR - order sent to [facility]. Advised I prefer to wait to see those results before prescribing another antibiotic as she admits to feeling better than she did last week, CBC was normal at that time, & she most likely has another viral URI, but pt very insistent on getting antibiotic and became agitated in the room, BP very high. Advised on continued water intake, can continue Coricidin OTC, sending refill of tessalon pearles and sent Augmentin, advised on use & SE.   - benzonatate (TESSALON PERLES) 100 MG capsule; Take 1 capsule (100 mg total) by mouth 3 (three) times daily as needed for cough.  Dispense: 20 capsule; Refill: 0 - DG Chest 2 View; Future - amoxicillin-clavulanate (AUGMENTIN) 875-125 MG tablet; Take 1 tablet by mouth 2 (two) times daily after a meal.  Dispense: 10 tablet; Refill: 0  Subjective:    Outpatient Medications Prior to Visit  Medication Sig Dispense Refill   ALPRAZolam (XANAX) 0.25 MG tablet Take 0.25 mg by mouth at bedtime as needed for anxiety.     amiodarone (PACERONE) 200 MG tablet Take 1 tablet (200 mg total) by mouth daily. 30 tablet 10   apixaban (ELIQUIS) 5 MG TABS tablet Take 1  tablet (5 mg total) by mouth 2 (two) times daily. 60 tablet 5   atorvastatin (LIPITOR) 20 MG tablet Take 1 tablet (20 mg total) by mouth daily. 90 tablet 3   azithromycin (ZITHROMAX Z-PAK) 250 MG tablet Take 2 tablets on day one Take 1 tablet day 2-5 6 each 0   Calcium Carb-Cholecalciferol (CALCIUM 600 + D PO) Take 1 tablet by mouth daily.     carvedilol (COREG) 25 MG tablet Take 1 tablet (25 mg total) by mouth 2 (two) times daily with a meal. 180 tablet 3   cholecalciferol (VITAMIN D3) 25 MCG (1000 UNIT) tablet Take 1,000 Units by mouth daily.     Cyanocobalamin (VITAMIN B-12) 5000 MCG TBDP Take 5,000 mcg by mouth daily.     Ferrous Sulfate (IRON PO) Take 2.5 mLs by mouth in the morning and at bedtime. Liquid Iron     fluticasone (FLONASE) 50 MCG/ACT nasal spray Place 1 spray into both nostrils daily as needed for allergies or rhinitis.     folic acid (FOLVITE) 400 MCG tablet Take 400 mcg by mouth daily.     hydrALAZINE (APRESOLINE) 50 MG tablet Take 1 tablet (50 mg total) by mouth 2 (two) times daily. 180 tablet 3   isosorbide mononitrate (ISMO) 20 MG tablet Take 1 tablet (20 mg total) by mouth daily. 90 tablet 3  levothyroxine (SYNTHROID) 25 MCG tablet Take 1 tablet (25 mcg total) by mouth daily before breakfast. 90 tablet 3   meclizine (ANTIVERT) 12.5 MG tablet Take 1 tablet (12.5 mg total) by mouth 3 (three) times daily as needed for dizziness. 180 tablet 1   nitroGLYCERIN (NITROSTAT) 0.4 MG SL tablet Place 1 tablet (0.4 mg total) under the tongue every 5 (five) minutes as needed for chest pain. 25 tablet 0   ondansetron (ZOFRAN-ODT) 4 MG disintegrating tablet Take 1 tablet (4 mg total) by mouth every 8 (eight) hours as needed for nausea or vomiting. 10 tablet 0   pantoprazole (PROTONIX) 40 MG tablet Take 1 tablet (40 mg total) by mouth 2 (two) times daily before a meal. 180 tablet 3   potassium chloride SA (KLOR-CON M20) 20 MEQ tablet Take 1 tablet (20 mEq total) by mouth daily. 90 tablet 3    valsartan-hydrochlorothiazide (DIOVAN-HCT) 160-25 MG tablet Take 1 tablet by mouth daily. 90 tablet 3   benzonatate (TESSALON PERLES) 100 MG capsule Take 1 capsule (100 mg total) by mouth 3 (three) times daily as needed for cough. 20 capsule 0   No facility-administered medications prior to visit.   Past Medical History:  Diagnosis Date   AF (paroxysmal atrial fibrillation) (HCC) 03/10/2020   Allergy    Chronic kidney disease, stage 3b (HCC) 03/10/2020   Coronary artery disease    10 stents -started 2014   Hypertension    Hypothyroidism 03/10/2020   Lipoma 2014   heart   Mixed hyperlipidemia due to type 2 diabetes mellitus (HCC) 03/10/2020   PONV (postoperative nausea and vomiting)    Type 2 diabetes mellitus with stage 3b chronic kidney disease, without long-term current use of insulin (HCC) 03/10/2020   Past Surgical History:  Procedure Laterality Date   ABDOMINAL AORTOGRAM W/LOWER EXTREMITY N/A 08/22/2022   Procedure: ABDOMINAL AORTOGRAM W/LOWER EXTREMITY;  Surgeon: Iran Ouch, MD;  Location: [facility];  Service: Cardiovascular;  Laterality: N/A;   ABDOMINAL HYSTERECTOMY     and bso   CAROTID ENDARTERECTOMY Left 2021   CORONARY ANGIOPLASTY WITH STENT PLACEMENT     x10   MITRAL VALVE REPAIR  12/09/2012   PERIPHERAL INTRAVASCULAR LITHOTRIPSY Right 08/22/2022   Procedure: PERIPHERAL INTRAVASCULAR LITHOTRIPSY;  Surgeon: Iran Ouch, MD;  Location: [facility];  Service: Cardiovascular;  Laterality: Right;  R SFA, TP trunk and PT   PERIPHERAL VASCULAR BALLOON ANGIOPLASTY Right 08/22/2022   Procedure: PERIPHERAL VASCULAR BALLOON ANGIOPLASTY;  Surgeon: Iran Ouch, MD;  Location: [facility];  Service: Cardiovascular;  Laterality: Right;  R SFA, TP trunk and PT   TRANSCATHETER AORTIC VALVE REPLACEMENT, 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   Allergies  Allergen Reactions   Iodine-131 Nausea Only   Sulfa Antibiotics Hives      Objective:    Physical  Exam Vitals and nursing note reviewed.  Constitutional:      Appearance: Normal appearance. She is obese. She is not ill-appearing.  Cardiovascular:     Rate and Rhythm: Normal rate and regular rhythm.  Pulmonary:     Effort: Pulmonary effort is normal. No respiratory distress.     Breath sounds: Examination of the right-upper field reveals rhonchi. Rhonchi (mild) present.  Musculoskeletal:        General: Normal range of motion.  Skin:    General: Skin is warm and dry.  Neurological:     Mental Status: She is alert.  Psychiatric:  Mood and Affect: Mood normal.        Behavior: Behavior normal.    BP (!) 182/92   Pulse 70   Temp 97.7 F (36.5 C) (Temporal)   Ht 5\' 5"  (1.651 m)   Wt 244 lb 9.6 oz (110.9 kg)   SpO2 97%   BMI 40.70 kg/m  Wt Readings from Last 3 Encounters:  11/21/22 244 lb 9.6 oz (110.9 kg)  11/15/22 243 lb 3.2 oz (110.3 kg)  09/14/22 248 lb (112.5 kg)       Dulce Sellar, NP

## 2022-11-21 NOTE — Telephone Encounter (Signed)
See other encounter.

## 2022-11-21 NOTE — Telephone Encounter (Signed)
Returned call to pt. She states Dr. Flora Lipps told her she was to call if she was worse to get the CXR done. She is going to her family physician today at 4:30 and would like to get it done prior to that appointment. Please advise.

## 2022-11-21 NOTE — Addendum Note (Signed)
Addended by: Hayden Pedro A on: 11/21/2022 11:37 AM   Modules accepted: Orders

## 2022-11-21 NOTE — Telephone Encounter (Signed)
Pt states Dr. Scharlene Gloss wanted her to have a chest x ray and she wants to know if she can get it done today. Please advise, there are no orders in for xray yet.

## 2022-11-22 ENCOUNTER — Ambulatory Visit (INDEPENDENT_AMBULATORY_CARE_PROVIDER_SITE_OTHER)
Admission: RE | Admit: 2022-11-22 | Discharge: 2022-11-22 | Disposition: A | Discharge status: Home / Self Care | Payer: Medicare Other | Source: Ambulatory Visit | Attending: Family | Admitting: Family

## 2022-11-22 DIAGNOSIS — Z955 Presence of coronary angioplasty implant and graft: Secondary | ICD-10-CM | POA: Diagnosis not present

## 2022-11-22 DIAGNOSIS — R053 Chronic cough: Secondary | ICD-10-CM

## 2022-11-22 NOTE — Telephone Encounter (Signed)
Returned call to pt with Dr. Marylene Buerger advice below. Pt verbalized understanding.    Plavix was for 3 months, she should be off this.  Gerri Spore T. Flora Lipps, MD, [facility]

## 2022-11-28 ENCOUNTER — Telehealth: Payer: Self-pay | Admitting: Cardiovascular Disease

## 2022-11-28 DIAGNOSIS — R053 Chronic cough: Secondary | ICD-10-CM

## 2022-11-28 MED ORDER — BENZONATATE 100 MG PO CAPS
100.0000 mg | ORAL_CAPSULE | Freq: Three times a day (TID) | ORAL | 0 refills | Status: DC | PRN
Start: 2022-11-28 — End: 2023-01-24

## 2022-11-28 NOTE — Telephone Encounter (Signed)
Spoke with patient and she states she needs refill on tessalon perles. Did advise to contact PCP and she got upset and stated you prescribed it for her and you need to refill it.

## 2022-11-28 NOTE — Telephone Encounter (Signed)
Refill sent to pharmacy   Left voicemail to return call to office

## 2022-11-28 NOTE — Telephone Encounter (Signed)
Pt c/o medication issue:  1. Name of Medication: benzonatate (TESSALON PERLES) 100 MG capsule   2. How are you currently taking this medication (dosage and times per day)?   3. Are you having a reaction (difficulty breathing--STAT)?   4. What is your medication issue? Patient is requesting this medication be called in for her. She states that she is almost over her cough, but not quite and almost out of this med. She is requesting this medication be filled by cardiologist if possible.

## 2022-12-02 NOTE — Progress Notes (Signed)
Not sure how to interpret this? No history of interstitial disease, so she now does have a mild form of this? Does she need a pulmonary referral?

## 2022-12-05 ENCOUNTER — Other Ambulatory Visit: Payer: Self-pay | Admitting: *Deleted

## 2022-12-05 DIAGNOSIS — R053 Chronic cough: Secondary | ICD-10-CM

## 2022-12-17 ENCOUNTER — Other Ambulatory Visit: Payer: Self-pay | Admitting: Cardiovascular Disease

## 2022-12-17 NOTE — Telephone Encounter (Signed)
Refill request

## 2022-12-20 DIAGNOSIS — Z1330 Encounter for screening examination for mental health and behavioral disorders, unspecified: Secondary | ICD-10-CM | POA: Diagnosis not present

## 2022-12-20 DIAGNOSIS — E785 Hyperlipidemia, unspecified: Secondary | ICD-10-CM | POA: Diagnosis not present

## 2022-12-20 DIAGNOSIS — Z23 Encounter for immunization: Secondary | ICD-10-CM | POA: Diagnosis not present

## 2022-12-20 DIAGNOSIS — K219 Gastro-esophageal reflux disease without esophagitis: Secondary | ICD-10-CM | POA: Diagnosis not present

## 2022-12-20 DIAGNOSIS — E039 Hypothyroidism, unspecified: Secondary | ICD-10-CM | POA: Diagnosis not present

## 2022-12-20 DIAGNOSIS — E1169 Type 2 diabetes mellitus with other specified complication: Secondary | ICD-10-CM | POA: Diagnosis not present

## 2022-12-20 DIAGNOSIS — N184 Chronic kidney disease, stage 4 (severe): Secondary | ICD-10-CM | POA: Diagnosis not present

## 2022-12-20 DIAGNOSIS — I739 Peripheral vascular disease, unspecified: Secondary | ICD-10-CM | POA: Diagnosis not present

## 2022-12-20 DIAGNOSIS — E1159 Type 2 diabetes mellitus with other circulatory complications: Secondary | ICD-10-CM | POA: Diagnosis not present

## 2022-12-20 DIAGNOSIS — I152 Hypertension secondary to endocrine disorders: Secondary | ICD-10-CM | POA: Diagnosis not present

## 2022-12-20 DIAGNOSIS — Z6841 Body Mass Index (BMI) 40.0 and over, adult: Secondary | ICD-10-CM | POA: Diagnosis not present

## 2022-12-20 DIAGNOSIS — I480 Paroxysmal atrial fibrillation: Secondary | ICD-10-CM | POA: Diagnosis not present

## 2022-12-31 ENCOUNTER — Other Ambulatory Visit: Payer: Self-pay | Admitting: Cardiovascular Disease

## 2022-12-31 NOTE — Telephone Encounter (Signed)
Prescription refill request for Eliquis received. Indication:afib Last office visit:8/24 Scr:1.60  8/24 Age: 77 Weight:110.9  kg  Prescription refilled

## 2023-01-01 ENCOUNTER — Telehealth: Payer: Self-pay | Admitting: Cardiovascular Disease

## 2023-01-01 MED ORDER — APIXABAN 5 MG PO TABS: 5.0000 mg | ORAL_TABLET | Freq: Two times a day (BID) | ORAL | Status: DC

## 2023-01-01 NOTE — Telephone Encounter (Signed)
I called the patient and let her know that I was going to get a month's worth of samples ready for her. She said, "Oh, they are not gonna give me through the end of the year?" I told her no, just a month's worth. She told me not to get the samples ready. She is not going to take them, because it won't do any good. She won't be able to take it after a month, so she will just not take it again until the beginning of the year because she cannot afford it.  Informed her that I would send this information to her provider.

## 2023-01-01 NOTE — Telephone Encounter (Signed)
Patient calling the office for samples of medication:   1.  What medication and dosage are you requesting samples for? ELIQUIS 5 MG TABS tablet   2.  Are you currently out of this medication?   No - Patient states she is in the donut hole and she will need enough medication to last her through the end of the year if possible.

## 2023-01-01 NOTE — Telephone Encounter (Signed)
Destiny Rice D, RPH-CPP  (3:03 PM)   Tell her when she runs out of the meds she bought and the samples, if we have enough we will give her until Jan, but we can't give all right now.  Gave the patient the information above. Told her that we can give her a month's worth now and when she gets close to running out, then give [place] a call and if we have enough samples at that time; we will give her more samples. So, we can give her a month at a time (if we have enough samples) we just cannot give it "all" at one time. She verbalized understanding.   Will have her samples ready for pick-up. She plans to come on Thursday to pick them up.

## 2023-01-01 NOTE — Addendum Note (Signed)
Addended by: Scheryl Marten on: 01/01/2023 03:59 PM   Modules accepted: Orders

## 2023-01-01 NOTE — Telephone Encounter (Signed)
Patient states she wants samples to last to end of year Attempted to state we cannot give that amount.  She interrupts each time I trry to discuss options. She states "he always gives me samples" and she cannot get assistance.  She would not et me speak so passing on information to see f can be assisted by pharmacy

## 2023-01-01 NOTE — Telephone Encounter (Signed)
I tried to discuss alternatives with patient. She states she cannot take warfarin because she bleed on warfarin. We did talk about Xarelto and Me program [phone] and tax. She went on a tangent about paying tax on her prescriptions yesterday.  She is asking for 2 months of samples. Says if she doesn't get it, she just wont take it until the beginning of the year. I advised I would send a message to Dr. Flora Lipps.  I did explain that we typically cannot sample for longer than a month.

## 2023-01-02 ENCOUNTER — Telehealth: Payer: Self-pay | Admitting: Cardiovascular Disease

## 2023-01-02 NOTE — Telephone Encounter (Signed)
*  STAT* If patient is at the pharmacy, call can be transferred to refill team.   1. Which medications need to be refilled? (please list name of each medication and dose if known)   isosorbide mononitrate (ISMO) 20 MG tablet   2. Would you like to learn more about the convenience, safety, & potential cost savings by using the [facility]?   3. Are you open to using the [facility] (Type [facility]. ).  4. Which pharmacy/location (including street and city if local pharmacy) is medication to be sent to?  [id number] - [place], [place] - [address]   5. Do they need a 30 day or 90 day supply?   90 day  Patient stated she is completely out of this medication and wants this sent to her new pharmacy.

## 2023-01-02 NOTE — Telephone Encounter (Signed)
Pt is calling requesting a refill on isosorbide. Dr. Flora Lipps did not prescribe this medication. Would he like to refill this medication? Please address

## 2023-01-02 NOTE — Telephone Encounter (Signed)
*  STAT* If patient is at the pharmacy, call can be transferred to refill team.   1. Which medications need to be refilled? (please list name of each medication and dose if known) Isosorbide- she said [facility] pharmacy said they could not get it  2. Would you like to learn more about the convenience, safety, & potential cost savings by using the [facility]?**     3. Are you open to using the [facility] ([facility]..   4. Which pharmacy/location (including street and city if local pharmacy) is medication to be sent to? she wants you to please find a pharmacy thashas  it- [facility] [address], [place], first- she is so upset- only have 2 pills left    5. Do they need a 30 day or 90 day supply? 90 days and refill- please call today

## 2023-01-09 MED ORDER — ISOSORBIDE MONONITRATE 20 MG PO TABS: 20.0000 mg | ORAL_TABLET | Freq: Every day | ORAL | 3 refills | Status: DC

## 2023-01-09 NOTE — Telephone Encounter (Signed)
Pt's medication was sent to pt's pharmacy as requested. Confirmation received.  °

## 2023-01-22 NOTE — Progress Notes (Unsigned)
Cardiology Office Note:  .   Date:  01/24/2023  ID:  Destiny Rice, DOB 02/11/1946, MRN [id number] PCP: Wilfred Curtis, MD  [facility] Providers Cardiologist:  Reatha Harps, MD { History of Present Illness: Marland Kitchen   Destiny Rice is a 77 y.o. female with below history who presents for follow-up.   Discussed the use of AI scribe software for clinical note transcription with the patient, who gave verbal consent to proceed.  History of Present Illness   Destiny Rice, a 77 year old with a complex cardiac history including CAD status post PCI, severe mitral valve regurgitation status post mitral valve repair, severe stenosis status post TAVR, paroxysmal atrial fibrillation, carotid artery disease, CKD stage 3-4, hyperlipidemia, diabetes, and PAD, presents for a follow-up visit. The patient reports high blood pressure readings at the clinic, although she states her home readings are typically in the 130s to 140s. She denies any symptoms of angina. She also reports generalized itching, which she has been managing with over-the-counter medication. She notes that the itching started after her recent surgery and wonders if it could be related to nerve damage. Additionally, she reports bilateral shoulder pain that extends down her arms. The pain is severe enough that she has to hold her arms in a specific position for relief. She has been using alcohol rubs for temporary relief. She also reports feeling sleepy after meals and has been having difficulty sleeping at night. She reports going to bed late and waking up frequently during the night. No CP or SOB.          Problem List 1. CAD -prior PCI 2. Cardiac tumor 3. Mitral Valve Repair -26 mm Edwards annuloplasty ring -12/09/2012 4. Severe aortic stenosis s/p 26 mm Evolut Pro -04/24/2018 4. Paroxysmal Afib 5. Carotid Artery Disease s/p L CEA -R ICA 100% 6. HTN 7. DM -A1c 6.4 8. HLD -T chol 208 HDL 108, LDL 89, TG 59 9. 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 PAD/critical limb ischemia -angioplasty R peroneal/tibial artery    ROS: All other ROS reviewed and negative. Pertinent positives noted in the HPI.     Studies Reviewed: Marland Kitchen       Physical Exam:   VS:  BP (!) 168/80 (BP Location: Left Arm, Patient Position: Sitting, Cuff Size: Large)   Pulse 61   Resp 16   Ht 5\' 5"  (1.651 m)   Wt 245 lb 6.4 oz (111.3 kg)   SpO2 98%   BMI 40.84 kg/m    Wt Readings from Last 3 Encounters:  01/24/23 245 lb 6.4 oz (111.3 kg)  11/21/22 244 lb 9.6 oz (110.9 kg)  11/15/22 243 lb 3.2 oz (110.3 kg)    GEN: Well nourished, well developed in no acute distress NECK: No JVD; No carotid bruits CARDIAC: RRR, no murmurs, rubs, gallops RESPIRATORY:  Clear to auscultation without rales, wheezing or rhonchi  ABDOMEN: Soft, non-tender, non-distended EXTREMITIES:  No edema; No deformity  ASSESSMENT AND PLAN: .   Assessment and Plan    Hypertension Elevated blood pressure in clinic, but reports lower readings at home. No changes in medication planned. -Continue current antihypertensive regimen. -Monitor blood pressure at home.  Hyperlipidemia LDL 89, could be lower given history of CAD. -Increase Atorvastatin to 40mg  daily.  Bilateral Shoulder Pain New onset, bilateral shoulder pain. Possible polymyalgia rheumatica. -Order ESR to evaluate for inflammation.  Coronary Artery Disease (CAD) No symptoms of angina. History of PCI. -Continue current regimen, including Eliquis. No ASA  Paroxysmal  Atrial Fibrillation Managed with rhythm control strategy using Amiodarone. -Continue Amiodarone and Eliquis.  Mitral Valve Repair and Transcatheter Aortic Valve Replacement (TAVR) Stable. -Continue current management.  Carotid Artery Disease Right internal carotid occlusion, minimal disease in left. -Repeat ultrasound next year.  Diabetes A1c controlled. -Continue management with primary care physician.  Chronic Kidney Disease (CKD) Stage  3-4 Stable. -Continue current management.  Peripheral Artery Disease (PAD) Leg ulcer healed. -Continue current management. -foll with Dr. Kirke Corin   General Health Maintenance / Followup Plans -Return for follow-up in six months. -Improve sleep hygiene to address fatigue and potential impact on blood pressure.              Follow-up: Return in about 6 months (around 07/24/2023).  Time Spent with Patient: I have spent a total of 35 minutes caring for this patient today face to face, ordering and reviewing labs/tests, reviewing prior records/medical history, examining the patient, establishing an assessment and plan, communicating results/findings to the patient/family, and documenting in the medical record.   Signed, Lenna Gilford. Flora Lipps, MD, [facility]  [facility]  [address], [address] [place], [place] [address] [phone]  2:44 PM

## 2023-01-24 ENCOUNTER — Ambulatory Visit: Payer: Medicare Other | Attending: Cardiovascular Disease | Admitting: Cardiovascular Disease

## 2023-01-24 ENCOUNTER — Encounter: Payer: Self-pay | Admitting: Cardiovascular Disease

## 2023-01-24 VITALS — BP 168/80 | HR 61 | Resp 16 | Ht 65.0 in | Wt 245.4 lb

## 2023-01-24 DIAGNOSIS — I2510 Atherosclerotic heart disease of native coronary artery without angina pectoris: Secondary | ICD-10-CM | POA: Diagnosis not present

## 2023-01-24 DIAGNOSIS — Z9889 Other specified postprocedural states: Secondary | ICD-10-CM

## 2023-01-24 DIAGNOSIS — I739 Peripheral vascular disease, unspecified: Secondary | ICD-10-CM | POA: Diagnosis not present

## 2023-01-24 DIAGNOSIS — M25511 Pain in right shoulder: Secondary | ICD-10-CM | POA: Diagnosis not present

## 2023-01-24 DIAGNOSIS — E785 Hyperlipidemia, unspecified: Secondary | ICD-10-CM

## 2023-01-24 DIAGNOSIS — I10 Essential (primary) hypertension: Secondary | ICD-10-CM

## 2023-01-24 DIAGNOSIS — I480 Paroxysmal atrial fibrillation: Secondary | ICD-10-CM | POA: Diagnosis not present

## 2023-01-24 DIAGNOSIS — M25512 Pain in left shoulder: Secondary | ICD-10-CM

## 2023-01-24 DIAGNOSIS — Z952 Presence of prosthetic heart valve: Secondary | ICD-10-CM

## 2023-01-24 MED ORDER — ATORVASTATIN CALCIUM 40 MG PO TABS: 40.0000 mg | ORAL_TABLET | Freq: Every day | ORAL | 3 refills | Status: DC

## 2023-01-24 NOTE — Patient Instructions (Signed)
Medication Instructions:   - Start Lipitor 40mg  daily   *If you need a refill on your cardiac medications before your next appointment, please call your pharmacy*   Lab Work: - ESR    If you have labs (blood work) drawn today and your tests are completely normal, you will receive your results only by: MyChart Message (if you have MyChart) OR A paper copy in the mail If you have any lab test that is abnormal or we need to change your treatment, we will call you to review the results.   Testing/Procedures:  NONE   Follow-Up: At [facility], you and your health needs are our priority.  As part of our continuing mission to provide you with exceptional heart care, we have created designated Provider Care Teams.  These Care Teams include your primary Cardiologist (physician) and Advanced Practice Providers (APPs -  Physician Assistants and Nurse Practitioners) who all work together to provide you with the care you need, when you need it.  We recommend signing up for the patient portal called "MyChart".  Sign up information is provided on this After Visit Summary.  MyChart is used to connect with patients for Virtual Visits (Telemedicine).  Patients are able to view lab/test results, encounter notes, upcoming appointments, etc.  Non-urgent messages can be sent to your provider as well.   To learn more about what you can do with MyChart, go to [email or web address].    Your next appointment:   6 month(s)  The format for your next appointment:   In Person  Provider:   Reatha Harps, MD    Other Instructions

## 2023-01-25 LAB — SEDIMENTATION RATE: Sed Rate: 25 mm/h (ref 0–40)

## 2023-01-31 DIAGNOSIS — N1832 Chronic kidney disease, stage 3b: Secondary | ICD-10-CM | POA: Diagnosis not present

## 2023-01-31 DIAGNOSIS — D631 Anemia in chronic kidney disease: Secondary | ICD-10-CM | POA: Diagnosis not present

## 2023-01-31 DIAGNOSIS — N2581 Secondary hyperparathyroidism of renal origin: Secondary | ICD-10-CM | POA: Diagnosis not present

## 2023-01-31 DIAGNOSIS — I129 Hypertensive chronic kidney disease with stage 1 through stage 4 chronic kidney disease, or unspecified chronic kidney disease: Secondary | ICD-10-CM | POA: Diagnosis not present

## 2023-02-10 ENCOUNTER — Other Ambulatory Visit: Payer: Self-pay | Admitting: Family Medicine

## 2023-02-11 NOTE — Telephone Encounter (Signed)
Left message to return call 

## 2023-02-26 ENCOUNTER — Other Ambulatory Visit: Payer: Self-pay | Admitting: Family Medicine

## 2023-03-01 ENCOUNTER — Other Ambulatory Visit: Payer: Self-pay | Admitting: Family Medicine

## 2023-03-04 ENCOUNTER — Ambulatory Visit (HOSPITAL_COMMUNITY)
Admission: RE | Admit: 2023-03-04 | Discharge: 2023-03-04 | Disposition: A | Discharge status: Home / Self Care | Payer: Medicare Other | Source: Ambulatory Visit | Attending: Cardiovascular Disease | Admitting: Cardiovascular Disease

## 2023-03-04 DIAGNOSIS — I739 Peripheral vascular disease, unspecified: Secondary | ICD-10-CM | POA: Insufficient documentation

## 2023-03-05 DIAGNOSIS — F322 Major depressive disorder, single episode, severe without psychotic features: Secondary | ICD-10-CM | POA: Diagnosis not present

## 2023-03-05 DIAGNOSIS — R053 Chronic cough: Secondary | ICD-10-CM | POA: Diagnosis not present

## 2023-03-05 DIAGNOSIS — I517 Cardiomegaly: Secondary | ICD-10-CM | POA: Diagnosis not present

## 2023-03-08 ENCOUNTER — Other Ambulatory Visit: Payer: Self-pay

## 2023-03-08 DIAGNOSIS — I739 Peripheral vascular disease, unspecified: Secondary | ICD-10-CM

## 2023-03-08 LAB — VAS US ABI WITH/WO TBI

## 2023-03-12 ENCOUNTER — Ambulatory Visit: Payer: Medicare Other | Admitting: Cardiovascular Disease

## 2023-03-29 IMAGING — CR DG CHEST 2V
2 series · 2 of 2 positions shown · non-contrast
Comparison: None Available.

CLINICAL DATA: Amiodarone therapy

EXAM:
CHEST - 2 VIEW

[w chest pa]
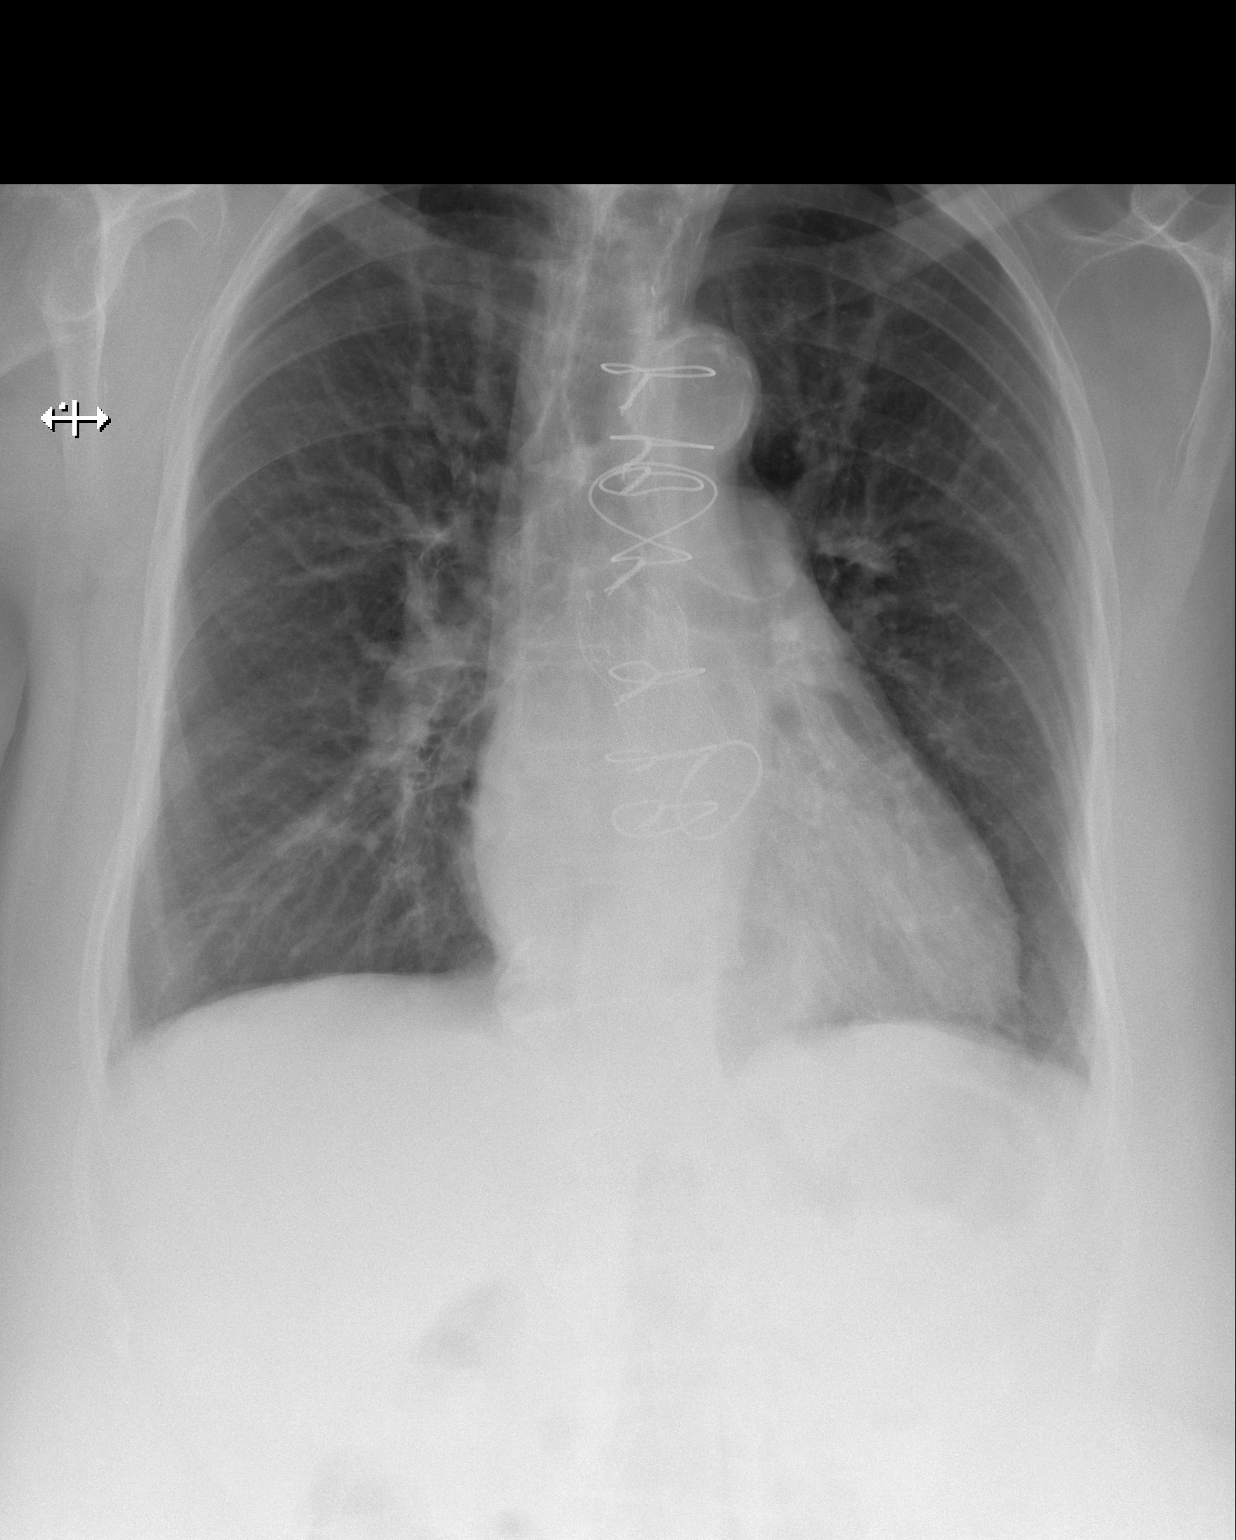

[w chest lat]
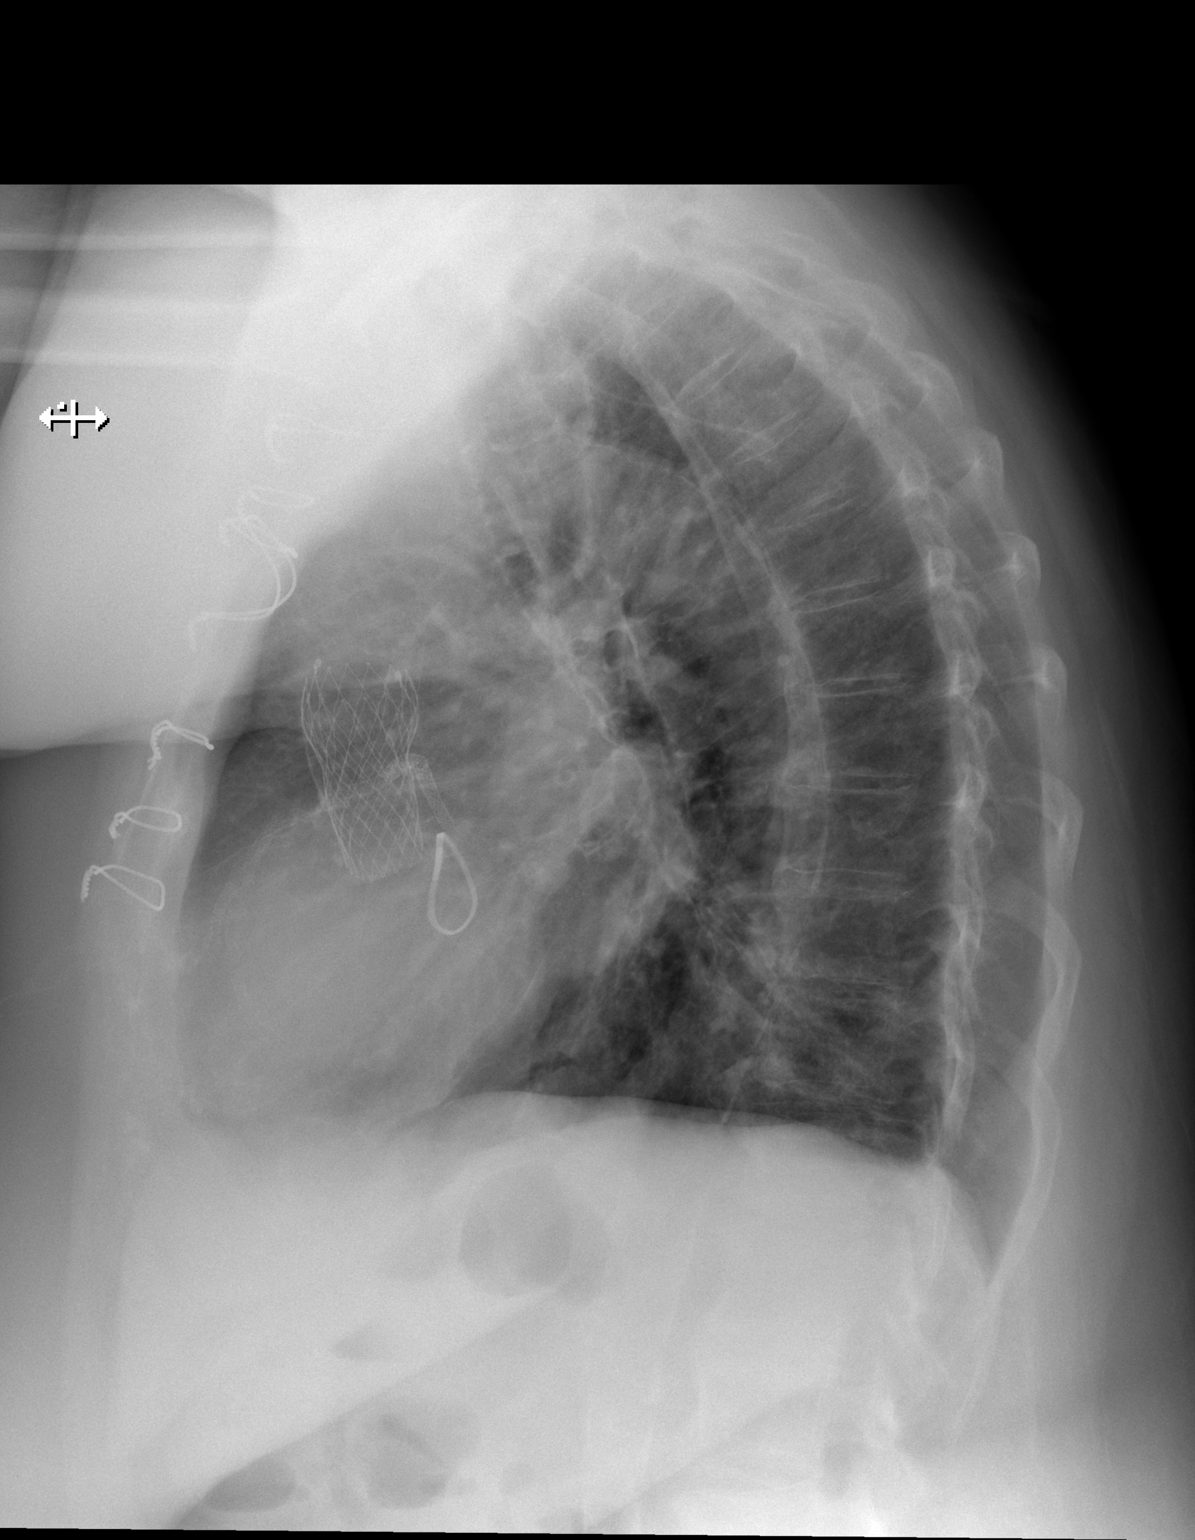

[2 of 2 positions shown; findings below may reference images not displayed]

FINDINGS: Normal cardiac contours. Aortic atherosclerosis. Prior median
sternotomy. No consolidative pulmonary opacities. No pleural
effusion or pneumothorax. Thoracic spine degenerative changes.
IMPRESSION: No active cardiopulmonary disease.

## 2023-04-02 ENCOUNTER — Telehealth: Payer: Self-pay | Admitting: Cardiovascular Disease

## 2023-04-02 NOTE — Telephone Encounter (Signed)
 Called and spoke to patient.Verified name and DOB. Patient states when she went to get her Eliquis  it was $400. Patient states she could not afford to pay $400 for a 30 day supply. She is completely out of medication and stated  I will do without, I can not afford this medication. I want to see if something else can be prescribed. Patient reported while crying she just lost her son, they are flying him back home and everything is happening at one time. Advised her to contact her insurance plan or Medicare and asked to get on the payment plan and she declined stating she had already talked to them. Offered to send her paperwork for patient assistance and she stated last time she tried she did not qualify. Please advise.

## 2023-04-02 NOTE — Telephone Encounter (Signed)
 Pt called in stating her Eliquis has gone up to $400. Please advise of other options. Pt states she is completely out so she will need samples.

## 2023-04-03 NOTE — Telephone Encounter (Signed)
 Patient identification verified by 2 forms. Bertina Cooks, RN    Called and spoke to patient  Patient states:   -paid the $400 cost for Eliquis    -she spoke to insurance and was told that her deductible was $400  -insurance told her that after deductible was met the cost will be $47   -she is frustrated that the deductible is that high   -she was able to confirm at pharmacy that next month the cost will be $47   -she hopes the cost will not increase at a later time  Advised patient to contact insurance to review plan and confirm deductible met  Patient verbalized understanding, no questions at this time

## 2023-04-04 NOTE — Telephone Encounter (Signed)
 Patient identification verified by 2 forms. Destiny Ellen, RN     Called and spoke to patient  Patient states: - She paid deductible already so that she can get her eliquis  so she does not need samples right now.  -Chart review did not show that patient completed patient assistance before. Patient requested forms by mailed out to her.  Patient agrees with plan, no questions at this time

## 2023-04-04 NOTE — Telephone Encounter (Signed)
 H&V Care Navigation CSW Progress Note  Clinical Social Worker  mailed information about SHIIP counseling  to pt- pt verbally referred to LCSW as she paid for her Eliquis  to meet deductible but shared with her financial challenge that was. Per RN pt may be on a limited income and qualify for Extra Help/Low Income Subsidy program and can speak with [facility] counselors about that application.  Patient is participating in a Managed Medicaid Plan:  No, BCBS Medicare  SDOH Screenings   Food Insecurity: No Food Insecurity (12/20/2022)   Received from [facility]  Housing: Low Risk  (06/12/2022)  Transportation Needs: No Transportation Needs (12/20/2022)   Received from [facility]  Utilities: Not At Risk (12/20/2022)   Received from [facility]  Depression (PHQ2-9): Low Risk  (06/12/2022)  Financial Resource Strain: Low Risk  (12/20/2022)   Received from [facility]  Physical Activity: Insufficiently Active (06/12/2022)  Social Connections: Socially Integrated (06/12/2022)  Stress: No Stress Concern Present (06/12/2022)  Tobacco Use: Low Risk  (03/05/2023)   Received from [facility]    Marit Lark, MSW, LCSW Clinical Social Worker [facility] Heart/Vascular Care Navigation  [phone]- work cell phone (preferred) [phone]- desk phone

## 2023-04-16 ENCOUNTER — Ambulatory Visit: Payer: Medicare Other | Attending: Cardiovascular Disease | Admitting: Cardiovascular Disease

## 2023-04-16 ENCOUNTER — Encounter: Payer: Self-pay | Admitting: Cardiovascular Disease

## 2023-04-16 VITALS — BP 122/76 | HR 70 | Ht 65.0 in | Wt 244.6 lb

## 2023-04-16 DIAGNOSIS — I2510 Atherosclerotic heart disease of native coronary artery without angina pectoris: Secondary | ICD-10-CM | POA: Diagnosis not present

## 2023-04-16 DIAGNOSIS — E785 Hyperlipidemia, unspecified: Secondary | ICD-10-CM | POA: Diagnosis not present

## 2023-04-16 DIAGNOSIS — I779 Disorder of arteries and arterioles, unspecified: Secondary | ICD-10-CM

## 2023-04-16 DIAGNOSIS — I10 Essential (primary) hypertension: Secondary | ICD-10-CM

## 2023-04-16 DIAGNOSIS — I480 Paroxysmal atrial fibrillation: Secondary | ICD-10-CM

## 2023-04-16 DIAGNOSIS — I739 Peripheral vascular disease, unspecified: Secondary | ICD-10-CM

## 2023-04-16 NOTE — Progress Notes (Unsigned)
Cardiology Office Note   Date:  04/16/2023   ID:  Destiny Rice, DOB [birth year], MRN [id number]  PCP:  Wilfred Curtis, MD  Cardiologist: Dr. Flora Lipps  No chief complaint on file.     History of Present Illness: Destiny Rice is a 78 y.o. female who is here today for a follow-up visit regarding peripheral arterial disease. She has known history of aortic valve disease status post TAVR, paroxysmal atrial fibrillation, carotid artery disease, chronic kidney disease, hyperlipidemia and diabetes mellitus. She was seen last year for a wound on the lateral right ankle.  Noninvasive vascular studies showed noncompressible vessels with abnormal TBI.  Duplex on the right showed severe distal right SFA stenosis and moderate stenosis in the TP trunk.  In addition, the anterior tibial artery was occluded. I proceeded with angiography in May of last year which showed no significant aortoiliac disease.  On the right side, there was severe stenosis in the distal SFA, significant disease in the TP trunk and proximal peroneal artery which was the only patent vessel below the knee.  This reconstituted the dorsalis pedis and posterior tibial via the communicating branches.  I performed successful intravascular lithotripsy and drug-coated balloon angioplasty to the distal right SFA/TP trunk and proximal peroneal artery. The wound healed completely and she has been doing well. She has been stressed due to death of her son last month of a heart attack.     Past Medical History:  Diagnosis Date   AF (paroxysmal atrial fibrillation) (HCC) 03/10/2020   Allergy    Chronic kidney disease, stage 3b (HCC) 03/10/2020   Coronary artery disease    10 stents -started 2014   Hypertension    Hypothyroidism 03/10/2020   Lipoma 2014   heart   Mixed hyperlipidemia due to type 2 diabetes mellitus (HCC) 03/10/2020   PONV (postoperative nausea and vomiting)    Type 2 diabetes mellitus with stage 3b chronic kidney  disease, without long-term current use of insulin (HCC) 03/10/2020    Past Surgical History:  Procedure Laterality Date   ABDOMINAL AORTOGRAM W/LOWER EXTREMITY N/A 08/22/2022   Procedure: ABDOMINAL AORTOGRAM W/LOWER EXTREMITY;  Surgeon: Iran Ouch, MD;  Location: [facility] IN[facility];  Service: Cardiovascular;  Laterality: N/A;   ABDOMINAL HYSTERECTOMY     and bso   CAROTID ENDARTERECTOMY Left 2021   CORONARY ANGIOPLASTY WITH STENT PLACEMENT     x10   MITRAL VALVE REPAIR  12/09/2012   PERIPHERAL INTRAVASCULAR LITHOTRIPSY Right 08/22/2022   Procedure: PERIPHERAL INTRAVASCULAR LITHOTRIPSY;  Surgeon: Iran Ouch, MD;  Location: [facility] INVASIVE CV LAB;  Service: Cardiovascular;  Laterality: Right;  R SFA, TP trunk and PT   PERIPHERAL VASCULAR BALLOON ANGIOPLASTY Right 08/22/2022   Procedure: PERIPHERAL VASCULAR BALLOON ANGIOPLASTY;  Surgeon: Iran Ouch, MD;  Location: [facility] INVASIVE CV LAB;  Service: Cardiovascular;  Laterality: Right;  R SFA, TP trunk and PT   TRANSCATHETER AORTIC VALVE REPLACEMENT, 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     Current Outpatient Medications  Medication Sig Dispense Refill   ALPRAZolam (XANAX) 0.25 MG tablet Take 0.25 mg by mouth at bedtime as needed for anxiety.     amiodarone (PACERONE) 200 MG tablet Take 1 tablet (200 mg total) by mouth daily. 30 tablet 10   apixaban (ELIQUIS) 5 MG TABS tablet Take 1 tablet (5 mg total) by mouth 2 (two) times daily. 56 tablet    atorvastatin (LIPITOR) 40 MG tablet Take 1 tablet (40 mg total) by mouth daily.  90 tablet 3   Calcium Carb-Cholecalciferol (CALCIUM 600 + D PO) Take 1 tablet by mouth daily.     carvedilol (COREG) 25 MG tablet Take 1 tablet (25 mg total) by mouth 2 (two) times daily with a meal. 180 tablet 3   cholecalciferol (VITAMIN D3) 25 MCG (1000 UNIT) tablet Take 1,000 Units by mouth daily.     Cyanocobalamin (VITAMIN B-12) 5000 MCG TBDP Take 5,000 mcg by mouth daily.     ELIQUIS 5 MG TABS tablet TAKE 1 TABLET(5  MG) BY MOUTH TWICE DAILY 60 tablet 5   Ferrous Sulfate (IRON PO) Take 2.5 mLs by mouth in the morning and at bedtime. Liquid Iron     fluticasone (FLONASE) 50 MCG/ACT nasal spray Place 1 spray into both nostrils daily as needed for allergies or rhinitis.     folic acid (FOLVITE) 400 MCG tablet Take 400 mcg by mouth daily.     hydrALAZINE (APRESOLINE) 50 MG tablet Take 1 tablet (50 mg total) by mouth 2 (two) times daily. 180 tablet 3   isosorbide mononitrate (ISMO) 20 MG tablet Take 1 tablet (20 mg total) by mouth daily. 90 tablet 3   levothyroxine (SYNTHROID) 25 MCG tablet Take 1 tablet (25 mcg total) by mouth daily before breakfast. 90 tablet 3   meclizine (ANTIVERT) 12.5 MG tablet Take 1 tablet (12.5 mg total) by mouth 3 (three) times daily as needed for dizziness. 180 tablet 1   nitroGLYCERIN (NITROSTAT) 0.4 MG SL tablet Place 1 tablet (0.4 mg total) under the tongue every 5 (five) minutes as needed for chest pain. 25 tablet 0   pantoprazole (PROTONIX) 40 MG tablet Take 1 tablet (40 mg total) by mouth 2 (two) times daily before a meal. 180 tablet 3   potassium chloride SA (KLOR-CON M20) 20 MEQ tablet Take 1 tablet (20 mEq total) by mouth daily. 90 tablet 3   valsartan-hydrochlorothiazide (DIOVAN-HCT) 160-25 MG tablet Take 1 tablet by mouth daily. 90 tablet 3   No current facility-administered medications for this visit.    Allergies:   Iodine-131 and Sulfa antibiotics    Social History:  The patient  reports that she has never smoked. She has never used smokeless tobacco. She reports that she does not currently use alcohol. She reports that she does not use drugs.   Family History:  The patient's family history includes Alcohol abuse in her father and mother; Hypertension in her mother and sister; Stroke in her maternal grandmother.    ROS:  Please see the history of present illness.   Otherwise, review of systems are positive for none.   All other systems are reviewed and negative.     PHYSICAL EXAM: VS:  BP 122/76 (BP Location: Left Arm, Patient Position: Sitting)   Pulse 70   Ht 5\' 5"  (1.651 m)   Wt 244 lb 9.6 oz (110.9 kg)   SpO2 98%   BMI 40.70 kg/m  , BMI Body mass index is 40.7 kg/m. GEN: Well nourished, well developed, in no acute distress  HEENT: normal  Neck: no JVD, carotid bruits, or masses Cardiac: RRR; no rubs, or gallops, 3/ 6 systolic murmur in the aortic area.  In addition, there is 2 out of 6 decrescendo diastolic murmur. Respiratory:  clear to auscultation bilaterally, normal work of breathing GI: soft, nontender, nondistended, + BS MS: no deformity or atrophy  Skin: warm and dry, no rash Neuro:  Strength and sensation are intact Psych: euthymic mood, full affect   EKG:  EKG is not ordered today.    Recent Labs: 07/30/2022: ALT 10; TSH 3.51 11/15/2022: BUN 20; Creatinine, Ser 1.60; Hemoglobin 10.8; Platelets 285; Potassium 4.1; Sodium 140    Lipid Panel    Component Value Date/Time   CHOL 189 07/30/2022 1530   CHOL 208 (H) 02/26/2022 1026   TRIG 85.0 07/30/2022 1530   HDL 91.40 07/30/2022 1530   HDL 105 02/26/2022 1026   CHOLHDL 2 07/30/2022 1530   VLDL 17.0 07/30/2022 1530   LDLCALC 80 07/30/2022 1530   LDLCALC 91 02/26/2022 1026      Wt Readings from Last 3 Encounters:  04/16/23 244 lb 9.6 oz (110.9 kg)  01/24/23 245 lb 6.4 oz (111.3 kg)  11/21/22 244 lb 9.6 oz (110.9 kg)           No data to display            ASSESSMENT AND PLAN:  1.  Peripheral arterial disease: Status post endovascular intervention on the right lower extremity for critical limb ischemia with healing of the wound.  Continue medical therapy.  Repeat Doppler studies in December of this year.  2.  Paroxysmal atrial fibrillation: On Eliquis.  3.  Coronary artery disease involving native coronary arteries without angina: Continue medical therapy.  4.  Status post TAVR and mitral valve repair: Stable on most recent echocardiogram.  5.   Hyperlipidemia: Continue treatment with atorvastatin with a target LDL of less than 70.  Most recent lipid profile showed an LDL of 80.  6.  Essential hypertension: Blood pressure is well-controlled.  7.  Carotid artery disease status post left CEA with known chronically occluded right carotid artery.  8.  Chronic kidney disease: stable.    Disposition:   FU with me in 6 month  Signed,  Lorine Bears, MD  04/16/2023 11:44 AM    [facility]

## 2023-04-16 NOTE — Patient Instructions (Signed)
Medication Instructions:  No changes *If you need a refill on your cardiac medications before your next appointment, please call your pharmacy*   Lab Work: None ordered If you have labs (blood work) drawn today and your tests are completely normal, you will receive your results only by: MyChart Message (if you have MyChart) OR A paper copy in the mail If you have any lab test that is abnormal or we need to change your treatment, we will call you to review the results.   Testing/Procedures: None ordered   Follow-Up: At [facility], you and your health needs are our priority.  As part of our continuing mission to provide you with exceptional heart care, we have created designated Provider Care Teams.  These Care Teams include your primary Cardiologist (physician) and Advanced Practice Providers (APPs -  Physician Assistants and Nurse Practitioners) who all work together to provide you with the care you need, when you need it.  We recommend signing up for the patient portal called "MyChart".  Sign up information is provided on this After Visit Summary.  MyChart is used to connect with patients for Virtual Visits (Telemedicine).  Patients are able to view lab/test results, encounter notes, upcoming appointments, etc.  Non-urgent messages can be sent to your provider as well.   To learn more about what you can do with MyChart, go to [email or web address].    Your next appointment:   6 month(s)  Provider:   Dr. Arida  

## 2023-04-28 ENCOUNTER — Other Ambulatory Visit: Payer: Self-pay | Admitting: Cardiovascular Disease

## 2023-04-28 DIAGNOSIS — I160 Hypertensive urgency: Secondary | ICD-10-CM

## 2023-04-30 ENCOUNTER — Other Ambulatory Visit: Payer: Self-pay | Admitting: Cardiovascular Disease

## 2023-04-30 DIAGNOSIS — I160 Hypertensive urgency: Secondary | ICD-10-CM

## 2023-04-30 NOTE — Telephone Encounter (Signed)
Pt only has enough for today

## 2023-05-09 ENCOUNTER — Telehealth: Payer: Self-pay | Admitting: Cardiovascular Disease

## 2023-05-09 ENCOUNTER — Other Ambulatory Visit: Payer: Self-pay | Admitting: Family Medicine

## 2023-05-09 NOTE — Telephone Encounter (Signed)
Patient called again about her medication, she wants to know if something else can be prescribed to her since the medication is out of stock.  She states she can't wait any longer for her medication.  She became very tearful stating she is going to die she needs to have her medication today, that her son died of a heart attack and she is going to die next of the same.

## 2023-05-09 NOTE — Telephone Encounter (Signed)
Called and spoke to [facility]  Pharmacy states:  -shipment has not been received for isorsobide  -unsure if Rx is on back order  -[facility] [place] [facility]

## 2023-05-09 NOTE — Telephone Encounter (Signed)
Called and spoke to [facility]  Pharmacy states:   -refills available for Isosorbide   -medication was out of stock but shipment coming today   -patient can likely pick up later today or tomorrow  ___________________________________________________ Patient identification verified by 2 forms. Marilynn Rail, RN    Called and spoke to patient  Relayed pharmacy message  Patient did not verbalized understanding  Patient states:   -pharmacy does not have Rx in stock and has not been in stock  Informed patient per pharmacy Rx will come today with shipment  Patient disconnected the call

## 2023-05-09 NOTE — Telephone Encounter (Signed)
Pt c/o medication issue:  1. Name of Medication:  isosorbide mononitrate (ISMO) 20 MG tablet  2. How are you currently taking this medication (dosage and times per day)?   3. Are you having a reaction (difficulty breathing--STAT)?   4. What is your medication issue?   Patient states she took her last tablet yesterday and is now completely out of medication. She states her local pharmacies do not have the medication in stock. She states she has been trying to have her medication refilled for about 1 month. They have been giving her 2-3 tablets, but she is unable to get an additional supply.please advise.

## 2023-05-10 MED ORDER — ISOSORBIDE MONONITRATE ER 30 MG PO TB24: 30.0000 mg | ORAL_TABLET | Freq: Every day | ORAL | 3 refills | Status: DC

## 2023-05-10 NOTE — Telephone Encounter (Signed)
Called and spoke to [facility] pharmacy  Pharmacy states Imdur Rx ready for pick up   Patient identification verified by 2 forms. Marilynn Rail, RN    Called and spoke to patient  Informed patient per pharmacy Rx ready for pick up  Patient verbalized understanding, no questions at this time

## 2023-05-10 NOTE — Telephone Encounter (Signed)
Sande Rives, MD     Try imdur 30 mg daily.  Gerri Spore T. Flora Lipps, MD, [facility]  [facility] [address], [address] [place], [place] [address] [phone] 7:21 AM   ____________________________________________________ Patient identification verified by 2 forms. Destiny Rail, RN    Called and spoke to patient  Informed patient of Rx change  Patient requests Rx sent to [facility] pharmacy  Patient has no questions or concerns at this time

## 2023-05-15 ENCOUNTER — Other Ambulatory Visit: Payer: Self-pay | Admitting: Family Medicine

## 2023-05-26 ENCOUNTER — Other Ambulatory Visit: Payer: Self-pay | Admitting: Family Medicine

## 2023-05-27 ENCOUNTER — Other Ambulatory Visit: Payer: Self-pay | Admitting: Family Medicine

## 2023-05-27 NOTE — Telephone Encounter (Signed)
 Patient transferred to [facility].

## 2023-05-28 DIAGNOSIS — Z6841 Body Mass Index (BMI) 40.0 and over, adult: Secondary | ICD-10-CM | POA: Diagnosis not present

## 2023-05-28 DIAGNOSIS — J208 Acute bronchitis due to other specified organisms: Secondary | ICD-10-CM | POA: Diagnosis not present

## 2023-05-28 DIAGNOSIS — E66813 Obesity, class 3: Secondary | ICD-10-CM | POA: Diagnosis not present

## 2023-05-28 DIAGNOSIS — R051 Acute cough: Secondary | ICD-10-CM | POA: Diagnosis not present

## 2023-05-28 DIAGNOSIS — E1159 Type 2 diabetes mellitus with other circulatory complications: Secondary | ICD-10-CM | POA: Diagnosis not present

## 2023-05-28 DIAGNOSIS — B9689 Other specified bacterial agents as the cause of diseases classified elsewhere: Secondary | ICD-10-CM | POA: Diagnosis not present

## 2023-05-28 DIAGNOSIS — I152 Hypertension secondary to endocrine disorders: Secondary | ICD-10-CM | POA: Diagnosis not present

## 2023-06-06 ENCOUNTER — Telehealth: Payer: Self-pay | Admitting: Cardiovascular Disease

## 2023-06-06 NOTE — Telephone Encounter (Signed)
   Pt c/o of Chest Pain: STAT if active CP, including tightness, pressure, jaw pain, radiating pain to shoulder/upper arm/back, CP unrelieved by Nitro. Symptoms reported of SOB, nausea, vomiting, sweating.  1. Are you having CP right now? Tightness in her chest    2. Are you experiencing any other symptoms (ex. SOB, nausea, vomiting, sweating)? Both arm hurtingno   3. Is your CP continuous or coming and going?  comes and goes   4. Have you taken Nitroglycerin? no   5. How long have you been experiencing CP?  Started today    6. If NO CP at time of call then end call with telling Pt to call back or call 911 if Chest pain returns prior to return call from triage team.

## 2023-06-06 NOTE — Telephone Encounter (Signed)
 Called patient. Verified name and DOB. Patient stated feeling chest pain. Patient stated feeling pain on arm that she usually feels during arthritis flare up. Patient stated arm and chest pain started an hour ago. Patient took all her medications today. Took 2 nitroglycerin while on phone. Chest pain was relieved after 2 nitroglycerin but patient's family (also on the phone) expressed concern because of the chest pain. Patient denied shortness of breath, nausea, and vomiting. I advised to go to ER to be evaluated. Patient verbalized understanding.   Josie LPN

## 2023-06-13 DIAGNOSIS — Z780 Asymptomatic menopausal state: Secondary | ICD-10-CM | POA: Diagnosis not present

## 2023-06-13 DIAGNOSIS — E66813 Obesity, class 3: Secondary | ICD-10-CM | POA: Diagnosis not present

## 2023-06-13 DIAGNOSIS — Z23 Encounter for immunization: Secondary | ICD-10-CM | POA: Diagnosis not present

## 2023-06-13 DIAGNOSIS — Z6841 Body Mass Index (BMI) 40.0 and over, adult: Secondary | ICD-10-CM | POA: Diagnosis not present

## 2023-06-13 DIAGNOSIS — K219 Gastro-esophageal reflux disease without esophagitis: Secondary | ICD-10-CM | POA: Diagnosis not present

## 2023-06-13 DIAGNOSIS — E1169 Type 2 diabetes mellitus with other specified complication: Secondary | ICD-10-CM | POA: Diagnosis not present

## 2023-06-13 DIAGNOSIS — E1159 Type 2 diabetes mellitus with other circulatory complications: Secondary | ICD-10-CM | POA: Diagnosis not present

## 2023-06-13 DIAGNOSIS — Z0000 Encounter for general adult medical examination without abnormal findings: Secondary | ICD-10-CM | POA: Diagnosis not present

## 2023-06-13 DIAGNOSIS — E785 Hyperlipidemia, unspecified: Secondary | ICD-10-CM | POA: Diagnosis not present

## 2023-06-13 DIAGNOSIS — I152 Hypertension secondary to endocrine disorders: Secondary | ICD-10-CM | POA: Diagnosis not present

## 2023-06-13 DIAGNOSIS — E039 Hypothyroidism, unspecified: Secondary | ICD-10-CM | POA: Diagnosis not present

## 2023-06-14 DIAGNOSIS — E1169 Type 2 diabetes mellitus with other specified complication: Secondary | ICD-10-CM | POA: Diagnosis not present

## 2023-06-14 DIAGNOSIS — Z6841 Body Mass Index (BMI) 40.0 and over, adult: Secondary | ICD-10-CM | POA: Diagnosis not present

## 2023-06-14 DIAGNOSIS — E66813 Obesity, class 3: Secondary | ICD-10-CM | POA: Diagnosis not present

## 2023-06-14 DIAGNOSIS — E039 Hypothyroidism, unspecified: Secondary | ICD-10-CM | POA: Diagnosis not present

## 2023-06-14 DIAGNOSIS — I152 Hypertension secondary to endocrine disorders: Secondary | ICD-10-CM | POA: Diagnosis not present

## 2023-06-14 DIAGNOSIS — E785 Hyperlipidemia, unspecified: Secondary | ICD-10-CM | POA: Diagnosis not present

## 2023-06-14 DIAGNOSIS — Z0000 Encounter for general adult medical examination without abnormal findings: Secondary | ICD-10-CM | POA: Diagnosis not present

## 2023-06-14 DIAGNOSIS — E1159 Type 2 diabetes mellitus with other circulatory complications: Secondary | ICD-10-CM | POA: Diagnosis not present

## 2023-06-14 DIAGNOSIS — K219 Gastro-esophageal reflux disease without esophagitis: Secondary | ICD-10-CM | POA: Diagnosis not present

## 2023-06-21 DIAGNOSIS — N1832 Chronic kidney disease, stage 3b: Secondary | ICD-10-CM | POA: Diagnosis not present

## 2023-06-21 DIAGNOSIS — I129 Hypertensive chronic kidney disease with stage 1 through stage 4 chronic kidney disease, or unspecified chronic kidney disease: Secondary | ICD-10-CM | POA: Diagnosis not present

## 2023-06-21 DIAGNOSIS — D631 Anemia in chronic kidney disease: Secondary | ICD-10-CM | POA: Diagnosis not present

## 2023-06-21 DIAGNOSIS — E1122 Type 2 diabetes mellitus with diabetic chronic kidney disease: Secondary | ICD-10-CM | POA: Diagnosis not present

## 2023-06-26 ENCOUNTER — Telehealth: Payer: Self-pay | Admitting: Cardiovascular Disease

## 2023-06-26 NOTE — Telephone Encounter (Signed)
 Spoke to patient she stated for the past 3 days she has been having palpitations.She hears heart beating in ears.No chest pain.Stated she has been under a lot of stress her only son passed away this past 10-Mar-2024.Appointment scheduled with Dr.[name] 4/10 at 3:20 pm.Advised to monitor B/P daily and bring readings to appointment.I will make Dr.[name] aware.

## 2023-06-26 NOTE — Telephone Encounter (Signed)
 Patient c/o Palpitations:  STAT if patient reporting lightheadedness, shortness of breath, or chest pain  How long have you had palpitations/irregular HR/ Afib? Are you having the symptoms now? Palpitations for 3 days   Are you currently experiencing lightheadedness, SOB or CP? No   Do you have a history of afib (atrial fibrillation) or irregular heart rhythm? Yes   Have you checked your BP or HR? (document readings if available): no she hasn't checked   Are you experiencing any other symptoms? no

## 2023-06-28 ENCOUNTER — Other Ambulatory Visit: Payer: Self-pay | Admitting: Family Medicine

## 2023-06-28 ENCOUNTER — Other Ambulatory Visit: Payer: Self-pay | Admitting: Cardiovascular Disease

## 2023-07-01 ENCOUNTER — Other Ambulatory Visit: Payer: Self-pay | Admitting: Family Medicine

## 2023-07-01 ENCOUNTER — Telehealth: Payer: Self-pay | Admitting: Cardiovascular Disease

## 2023-07-01 ENCOUNTER — Encounter: Payer: Self-pay | Admitting: *Deleted

## 2023-07-01 MED ORDER — VALSARTAN-HYDROCHLOROTHIAZIDE 160-25 MG PO TABS: 1.0000 | ORAL_TABLET | Freq: Every day | ORAL | 3 refills | Status: DC

## 2023-07-01 NOTE — Telephone Encounter (Signed)
 Left message for pt to call.

## 2023-07-01 NOTE — Telephone Encounter (Signed)
 Pt c/o medication issue:  1. Name of Medication:   valsartan-hydrochlorothiazide (DIOVAN-HCT) 160-12.5 MG tablet    2. How are you currently taking this medication (dosage and times per day)? As written   3. Are you having a reaction (difficulty breathing--STAT)? No   4. What is your medication issue? Pt states she is still having palpitations and its making her sick.  She states before her PCP had her on this med 160-25mg , now she is on 160-12.5mg . She states she does not remember why this changed. Please advise.

## 2023-07-01 NOTE — Telephone Encounter (Signed)
 Spoke with pt, her valsartan-hydrochlorothiazide refill was sent in for 160/12.5 instead of 160/25. She reports she is having nausea and sickness from this different medication. Advised patient I do not see that the medication was changed and so I sent the prescription in for the 160/25 mg tablet.

## 2023-07-03 NOTE — Progress Notes (Unsigned)
  Cardiology Office Note:  .   Date:  07/03/2023  ID:  Destiny Rice, DOB [birth year], MRN [id number] PCP: Wilfred Curtis, MD  [facility] Providers Cardiologist:  Reatha Harps, MD { Click to update primary MD,subspecialty MD or APP then REFRESH:1}   History of Present Illness: .   No chief complaint on file.   Destiny Rice is a 78 y.o. female with history of pAF, CAD, PAD, MVR, TAVR, DM, HTN, HLD, CKD IV who presents for follow-up.      Problem List 1. CAD -prior PCI 2. Cardiac tumor 3. Mitral Valve Repair -26 mm Edwards annuloplasty ring -12/09/2012 4. Severe aortic stenosis s/p 26 mm Evolut Pro -04/24/2018 4. Paroxysmal Afib 5. Carotid Artery Disease s/p L CEA -R ICA 100% 6. HTN 7. DM -A1c 6.4 8. HLD -T chol 208 HDL 108, LDL 89, TG 59 9. 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 PAD/critical limb ischemia -angioplasty R peroneal/tibial artery    ROS: All other ROS reviewed and negative. Pertinent positives noted in the HPI.     Studies Reviewed: Marland Kitchen       TTE 04/23/2022  1. Left ventricular ejection fraction, by estimation, is 60 to 65%. The  left ventricle has normal function. The left ventricle has no regional  wall motion abnormalities. There is moderate concentric left ventricular  hypertrophy. Left ventricular  diastolic parameters are indeterminate.   2. Right ventricular systolic function is moderately reduced. The right  ventricular size is mildly enlarged.   3. Status post mitral annuloplasty ring. The mitral valve has been  repaired/replaced. Trivial mitral valve regurgitation. No evidence of  mitral stenosis.   4. The aortic valve has been repaired/replaced. Aortic valve  regurgitation is not visualized. No aortic stenosis is present. Aortic  valve mean gradient measures 12.0 mmHg. Aortic valve Vmax measures 2.41  m/s.   5. The inferior vena cava is normal in size with greater than 50%  respiratory variability, suggesting right atrial pressure of 3 mmHg.   Physical Exam:   VS:  There were no vitals taken for this visit.   Wt Readings from Last 3 Encounters:  04/16/23 244 lb 9.6 oz (110.9 kg)  01/24/23 245 lb 6.4 oz (111.3 kg)  11/21/22 244 lb 9.6 oz (110.9 kg)    GEN: Well nourished, well developed in no acute distress NECK: No JVD; No carotid bruits CARDIAC: ***RRR, no murmurs, rubs, gallops RESPIRATORY:  Clear to auscultation without rales, wheezing or rhonchi  ABDOMEN: Soft, non-tender, non-distended EXTREMITIES:  No edema; No deformity  ASSESSMENT AND PLAN: .   ***    {Are you ordering a CV Procedure (e.g. stress test, cath, DCCV, TEE, etc)?   Press F2        :[id number]}   Follow-up: No follow-ups on file.  Time Spent with Patient: I have spent a total of *** minutes caring for this patient today face to face, ordering and reviewing labs/tests, reviewing prior records/medical history, examining the patient, establishing an assessment and plan, communicating results/findings to the patient/family, and documenting in the medical record.   Signed, Lenna Gilford. Flora Lipps, MD, [facility]  [facility]  [address], [address] [place], [place] [address] [phone]  7:22 PM

## 2023-07-04 ENCOUNTER — Ambulatory Visit: Attending: Cardiovascular Disease | Admitting: Cardiovascular Disease

## 2023-07-04 ENCOUNTER — Encounter: Payer: Self-pay | Admitting: Cardiovascular Disease

## 2023-07-04 VITALS — BP 148/80 | HR 62 | Ht 65.0 in | Wt 235.0 lb

## 2023-07-04 DIAGNOSIS — I2510 Atherosclerotic heart disease of native coronary artery without angina pectoris: Secondary | ICD-10-CM | POA: Diagnosis not present

## 2023-07-04 DIAGNOSIS — E785 Hyperlipidemia, unspecified: Secondary | ICD-10-CM

## 2023-07-04 DIAGNOSIS — Z952 Presence of prosthetic heart valve: Secondary | ICD-10-CM

## 2023-07-04 DIAGNOSIS — I480 Paroxysmal atrial fibrillation: Secondary | ICD-10-CM | POA: Diagnosis not present

## 2023-07-04 DIAGNOSIS — I739 Peripheral vascular disease, unspecified: Secondary | ICD-10-CM | POA: Diagnosis not present

## 2023-07-04 DIAGNOSIS — I10 Essential (primary) hypertension: Secondary | ICD-10-CM

## 2023-07-04 DIAGNOSIS — Z9889 Other specified postprocedural states: Secondary | ICD-10-CM

## 2023-07-04 NOTE — Patient Instructions (Signed)
 Medication Instructions:  Your physician recommends that you continue on your current medications as directed. Please refer to the Current Medication list given to you today.    *If you need a refill on your cardiac medications before your next appointment, please call your pharmacy*   Lab Work: None    If you have labs (blood work) drawn today and your tests are completely normal, you will receive your results only by: MyChart Message (if you have MyChart) OR A paper copy in the mail If you have any lab test that is abnormal or we need to change your treatment, we will call you to review the results.   Testing/Procedures: None    Follow-Up: At [facility], you and your health needs are our priority.  As part of our continuing mission to provide you with exceptional heart care, we have created designated Provider Care Teams.  These Care Teams include your primary Cardiologist (physician) and Advanced Practice Providers (APPs -  Physician Assistants and Nurse Practitioners) who all work together to provide you with the care you need, when you need it.  We recommend signing up for the patient portal called "MyChart".  Sign up information is provided on this After Visit Summary.  MyChart is used to connect with patients for Virtual Visits (Telemedicine).  Patients are able to view lab/test results, encounter notes, upcoming appointments, etc.  Non-urgent messages can be sent to your provider as well.   To learn more about what you can do with MyChart, go to [email or web address].    Your next appointment:   6 month(s)  The format for your next appointment:   In Person  Provider:   Reatha Harps, MD    Other Instructions

## 2023-07-16 ENCOUNTER — Telehealth: Payer: Self-pay | Admitting: Cardiovascular Disease

## 2023-07-16 NOTE — Telephone Encounter (Signed)
 error

## 2023-07-17 DIAGNOSIS — H9319 Tinnitus, unspecified ear: Secondary | ICD-10-CM | POA: Diagnosis not present

## 2023-07-17 DIAGNOSIS — F411 Generalized anxiety disorder: Secondary | ICD-10-CM | POA: Diagnosis not present

## 2023-07-26 ENCOUNTER — Other Ambulatory Visit: Payer: Self-pay | Admitting: Cardiovascular Disease

## 2023-07-26 NOTE — Telephone Encounter (Signed)
 Prescription refill request for Eliquis  received. Indication:afib Last office visit:4/25 Scr:1.60 8/24 Age: 78 Weight:106.6  kg  Prescription refilled

## 2023-08-12 ENCOUNTER — Ambulatory Visit (HOSPITAL_COMMUNITY)
Admission: RE | Admit: 2023-08-12 | Discharge: 2023-08-12 | Disposition: A | Discharge status: Home / Self Care | Source: Ambulatory Visit | Attending: Physician Assistant | Admitting: Physician Assistant

## 2023-08-12 DIAGNOSIS — I739 Peripheral vascular disease, unspecified: Secondary | ICD-10-CM

## 2023-08-15 LAB — VAS US ABI WITH/WO TBI
Left ABI: 0.92
Right ABI: 0.92

## 2023-08-16 ENCOUNTER — Ambulatory Visit: Payer: Self-pay | Admitting: Physician Assistant

## 2023-08-23 ENCOUNTER — Other Ambulatory Visit: Payer: Self-pay

## 2023-08-23 DIAGNOSIS — I739 Peripheral vascular disease, unspecified: Secondary | ICD-10-CM

## 2023-08-29 ENCOUNTER — Encounter: Payer: Self-pay | Admitting: Cardiovascular Disease

## 2023-09-11 DIAGNOSIS — H903 Sensorineural hearing loss, bilateral: Secondary | ICD-10-CM | POA: Diagnosis not present

## 2023-09-11 DIAGNOSIS — H6123 Impacted cerumen, bilateral: Secondary | ICD-10-CM | POA: Diagnosis not present

## 2023-09-16 DIAGNOSIS — E119 Type 2 diabetes mellitus without complications: Secondary | ICD-10-CM | POA: Diagnosis not present

## 2023-09-26 ENCOUNTER — Other Ambulatory Visit: Payer: Self-pay | Admitting: Family Medicine

## 2023-10-14 DIAGNOSIS — I152 Hypertension secondary to endocrine disorders: Secondary | ICD-10-CM | POA: Diagnosis not present

## 2023-10-14 DIAGNOSIS — E1169 Type 2 diabetes mellitus with other specified complication: Secondary | ICD-10-CM | POA: Diagnosis not present

## 2023-10-14 DIAGNOSIS — E66813 Obesity, class 3: Secondary | ICD-10-CM | POA: Diagnosis not present

## 2023-10-14 DIAGNOSIS — E1159 Type 2 diabetes mellitus with other circulatory complications: Secondary | ICD-10-CM | POA: Diagnosis not present

## 2023-10-14 DIAGNOSIS — F322 Major depressive disorder, single episode, severe without psychotic features: Secondary | ICD-10-CM | POA: Diagnosis not present

## 2023-10-14 DIAGNOSIS — Z6841 Body Mass Index (BMI) 40.0 and over, adult: Secondary | ICD-10-CM | POA: Diagnosis not present

## 2023-10-14 DIAGNOSIS — K219 Gastro-esophageal reflux disease without esophagitis: Secondary | ICD-10-CM | POA: Diagnosis not present

## 2023-10-14 DIAGNOSIS — E039 Hypothyroidism, unspecified: Secondary | ICD-10-CM | POA: Diagnosis not present

## 2023-10-14 DIAGNOSIS — E785 Hyperlipidemia, unspecified: Secondary | ICD-10-CM | POA: Diagnosis not present

## 2023-10-14 DIAGNOSIS — N184 Chronic kidney disease, stage 4 (severe): Secondary | ICD-10-CM | POA: Diagnosis not present

## 2023-10-14 DIAGNOSIS — N1832 Chronic kidney disease, stage 3b: Secondary | ICD-10-CM | POA: Diagnosis not present

## 2023-10-14 DIAGNOSIS — E1122 Type 2 diabetes mellitus with diabetic chronic kidney disease: Secondary | ICD-10-CM | POA: Diagnosis not present

## 2023-10-15 ENCOUNTER — Other Ambulatory Visit: Payer: Self-pay | Admitting: Cardiovascular Disease

## 2023-10-17 DIAGNOSIS — H401232 Low-tension glaucoma, bilateral, moderate stage: Secondary | ICD-10-CM | POA: Diagnosis not present

## 2023-11-18 ENCOUNTER — Telehealth: Payer: Self-pay | Admitting: Cardiovascular Disease

## 2023-11-18 NOTE — Telephone Encounter (Signed)
 Having cramping radiating underneath right calf and radiating down to ankle. Started today, has been persistent and becoming progressively worse. She put alcohol and Nervive and tiger balm on it, but nothing helped. No additional symptoms reported.

## 2023-11-18 NOTE — Telephone Encounter (Signed)
 Spoke with pt, she woke this morning and her right leg is cramping. She reports it is constant, alcohol and walking around has not helped it. The leg is not swollen, red or warm to the touch. She also complains of her right leg hurting a lot at night and she will have to sit with that leg elevated. She also reports having pain in both arms and she can not use her arms when she gets that pain. Patient is overdue to see dr darron, next available follow up scheduled.

## 2023-11-26 ENCOUNTER — Ambulatory Visit: Admitting: Cardiovascular Disease

## 2023-11-26 NOTE — Progress Notes (Deleted)
 Cardiology Office Note   Date:  11/26/2023   ID:  BONNIEJEAN Rice, DOB 1946/02/11, MRN [id number]  PCP:  Reita Locus, MD  Cardiologist: Dr. CLEMENTEEN Mulch  No chief complaint on file.     History of Present Illness: Destiny Rice is a 78 y.o. female who is here today for a follow-up visit regarding peripheral arterial disease. She has known history of aortic valve disease status post TAVR, paroxysmal atrial fibrillation, carotid artery disease, chronic kidney disease, hyperlipidemia and diabetes mellitus. She was seen in 2024 for a wound on the lateral right ankle.  Noninvasive vascular studies showed noncompressible vessels with abnormal TBI.  Angiography in May 2024 showed no significant aortoiliac disease.  On the right side, there was severe stenosis in the distal SFA, significant disease in the TP trunk and proximal peroneal artery which was the only patent vessel below the knee.  This reconstituted the dorsalis pedis and posterior tibial via the communicating branches.  I performed successful intravascular lithotripsy and drug-coated balloon angioplasty to the distal right SFA/TP trunk and proximal peroneal artery.  The wound healed completely and she has been doing well. She has been stressed due to death of her son last month of a heart attack.     Past Medical History:  Diagnosis Date   AF (paroxysmal atrial fibrillation) (HCC) 03/10/2020   Allergy    Chronic kidney disease, stage 3b (HCC) 03/10/2020   Coronary artery disease    10 stents -started 2014   Hypertension    Hypothyroidism 03/10/2020   Lipoma 2014   heart   Mixed hyperlipidemia due to type 2 diabetes mellitus (HCC) 03/10/2020   PONV (postoperative nausea and vomiting)    Type 2 diabetes mellitus with stage 3b chronic kidney disease, without long-term current use of insulin  (HCC) 03/10/2020    Past Surgical History:  Procedure Laterality Date   ABDOMINAL AORTOGRAM W/LOWER EXTREMITY N/A 08/22/2022    Procedure: ABDOMINAL AORTOGRAM W/LOWER EXTREMITY;  Surgeon: Darron Deatrice LABOR, MD;  Location: [facility];  Service: Cardiovascular;  Laterality: N/A;   ABDOMINAL HYSTERECTOMY     and bso   CAROTID ENDARTERECTOMY Left 2021   CORONARY ANGIOPLASTY WITH STENT PLACEMENT     x10   MITRAL VALVE REPAIR  12/09/2012   PERIPHERAL INTRAVASCULAR LITHOTRIPSY Right 08/22/2022   Procedure: PERIPHERAL INTRAVASCULAR LITHOTRIPSY;  Surgeon: Darron Deatrice LABOR, MD;  Location: [facility];  Service: Cardiovascular;  Laterality: Right;  R SFA, TP trunk and PT   PERIPHERAL VASCULAR BALLOON ANGIOPLASTY Right 08/22/2022   Procedure: PERIPHERAL VASCULAR BALLOON ANGIOPLASTY;  Surgeon: Darron Deatrice LABOR, MD;  Location: [facility];  Service: Cardiovascular;  Laterality: Right;  R SFA, TP trunk and PT   TRANSCATHETER AORTIC VALVE REPLACEMENT, 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     Current Outpatient Medications  Medication Sig Dispense Refill   ALPRAZolam  (XANAX ) 0.25 MG tablet Take 0.25 mg by mouth at bedtime as needed for anxiety.     amiodarone  (PACERONE ) 200 MG tablet TAKE 1 TABLET BY MOUTH DAILY 90 tablet 3   apixaban  (ELIQUIS ) 5 MG TABS tablet Take 1 tablet (5 mg total) by mouth 2 (two) times daily. 56 tablet    atorvastatin  (LIPITOR) 40 MG tablet Take 1 tablet (40 mg total) by mouth daily. 90 tablet 3   Calcium  Carb-Cholecalciferol (CALCIUM  600 + D PO) Take 1 tablet by mouth daily.     carvedilol  (COREG ) 25 MG tablet Take 1 tablet (25 mg total) by  mouth 2 (two) times daily with a meal. 180 tablet 3   cholecalciferol (VITAMIN D3) 25 MCG (1000 UNIT) tablet Take 1,000 Units by mouth daily.     Cyanocobalamin  (VITAMIN B-12) 5000 MCG TBDP Take 5,000 mcg by mouth daily.     ELIQUIS  5 MG TABS tablet TAKE 1 TABLET(5 MG) BY MOUTH TWICE DAILY 60 tablet 5   Ferrous Sulfate (IRON PO) Take 2.5 mLs by mouth in the morning and at bedtime. Liquid Iron     fluticasone (FLONASE) 50 MCG/ACT nasal spray Place 1 spray into  both nostrils daily as needed for allergies or rhinitis.     folic acid  (FOLVITE ) 400 MCG tablet Take 400 mcg by mouth daily.     hydrALAZINE  (APRESOLINE ) 50 MG tablet TAKE 1 TABLET BY MOUTH TWICE DAILY 180 tablet 3   isosorbide  mononitrate (IMDUR ) 30 MG 24 hr tablet Take 1 tablet (30 mg total) by mouth daily. 90 tablet 3   levothyroxine  (SYNTHROID ) 25 MCG tablet Take 1 tablet (25 mcg total) by mouth daily before breakfast. 90 tablet 3   meclizine  (ANTIVERT ) 12.5 MG tablet Take 1 tablet (12.5 mg total) by mouth 3 (three) times daily as needed for dizziness. 180 tablet 1   nitroGLYCERIN  (NITROSTAT ) 0.4 MG SL tablet Place 1 tablet (0.4 mg total) under the tongue every 5 (five) minutes as needed for chest pain. 25 tablet 0   pantoprazole  (PROTONIX ) 40 MG tablet Take 1 tablet (40 mg total) by mouth 2 (two) times daily before a meal. 180 tablet 3   potassium chloride  SA (KLOR-CON  M20) 20 MEQ tablet Take 1 tablet (20 mEq total) by mouth daily. 90 tablet 3   valsartan -hydrochlorothiazide  (DIOVAN -HCT) 160-25 MG tablet Take 1 tablet by mouth daily. 90 tablet 3   No current facility-administered medications for this visit.    Allergies:   Iodine -131 and Sulfa antibiotics    Social History:  The patient  reports that she has never smoked. She has never used smokeless tobacco. She reports that she does not currently use alcohol. She reports that she does not use drugs.   Family History:  The patient's family history includes Alcohol abuse in her father and mother; Hypertension in her mother and sister; Stroke in her maternal grandmother.    ROS:  Please see the history of present illness.   Otherwise, review of systems are positive for none.   All other systems are reviewed and negative.    PHYSICAL EXAM: VS:  There were no vitals taken for this visit. , BMI There is no height or weight on file to calculate BMI. GEN: Well nourished, well developed, in no acute distress  HEENT: normal  Neck: no JVD,  carotid bruits, or masses Cardiac: RRR; no rubs, or gallops, 3/ 6 systolic murmur in the aortic area.  In addition, there is 2 out of 6 decrescendo diastolic murmur. Respiratory:  clear to auscultation bilaterally, normal work of breathing GI: soft, nontender, nondistended, + BS MS: no deformity or atrophy  Skin: warm and dry, no rash Neuro:  Strength and sensation are intact Psych: euthymic mood, full affect   EKG:  EKG is not ordered today.    Recent Labs: No results found for requested labs within last 365 days.    Lipid Panel    Component Value Date/Time   CHOL 189 07/30/2022 1530   CHOL 208 (H) 02/26/2022 1026   TRIG 85.0 07/30/2022 1530   HDL 91.40 07/30/2022 1530   HDL 105 02/26/2022 1026  CHOLHDL 2 07/30/2022 1530   VLDL 17.0 07/30/2022 1530   LDLCALC 80 07/30/2022 1530   LDLCALC 91 02/26/2022 1026      Wt Readings from Last 3 Encounters:  07/04/23 235 lb (106.6 kg)  04/16/23 244 lb 9.6 oz (110.9 kg)  01/24/23 245 lb 6.4 oz (111.3 kg)           No data to display            ASSESSMENT AND PLAN:  1.  Peripheral arterial disease: Status post endovascular intervention on the right lower extremity for critical limb ischemia with healing of the wound.  Most recent Doppler studies in May of this year showed low normal ABI with patent site of intervention.  2.  Paroxysmal atrial fibrillation: On Eliquis .  3.  Coronary artery disease involving native coronary arteries without angina: Continue medical therapy.  4.  Status post TAVR and mitral valve repair: Stable on most recent echocardiogram.  5.  Hyperlipidemia: Continue treatment with atorvastatin  with a target LDL of less than 70.  Most recent lipid profile showed an LDL of 80.  6.  Essential hypertension: Blood pressure is well-controlled.  7.  Carotid artery disease status post left CEA with known chronically occluded right carotid artery.  8.  Chronic kidney disease: stable.    Disposition:    FU with me in 6 month  Signed,  Deatrice Cage, MD  11/26/2023 7:54 AM    [facility]

## 2023-12-03 ENCOUNTER — Ambulatory Visit: Admitting: Cardiovascular Disease

## 2023-12-03 ENCOUNTER — Telehealth: Payer: Self-pay | Admitting: Cardiovascular Disease

## 2023-12-03 NOTE — Telephone Encounter (Signed)
 Called pt to resch appt today with arida, she stated her arms are aching and would like to know what she can put on then or help until she can get to see Arida on 9/16?    Best number [phone]

## 2023-12-03 NOTE — Telephone Encounter (Signed)
 Spoke with pt regarding her arm and leg pain. Pt stated she has neuropathy and arthritis that is causing her pain. Pt stated she has been using capsaician cream for pain however she is out and cannot find it anywhere. Pt had an appointment today with Darron but it was canceled and she was rescheduled for 9/16 however Dr. Darron is not her primary cardiologist; Dr. Barbaraann is. Pt would like to be scheduled with him. Pt was told to follow up with her PCP regarding the arm and leg pain. She denied any chest pain or shortness of breath. Pt give ED precautions. Pt was told Dr. Barbaraann would be notified. Pt verbalized understanding. All questions if any were answered. A message will be sent to scheduling to reschedule to pt to see Dr. Barbaraann.

## 2023-12-08 NOTE — Progress Notes (Signed)
 Patient ID:  ABYGAYLE DELTORO is a 78 y.o. (DOB [birth year]) female.  Assessment and Plan   1. Primary osteoarthritis involving multiple joints   2. Need for influenza vaccination     Gracemarie was seen today for pain. Diagnoses and all orders for this visit: 1. Primary osteoarthritis involving multiple joints (Primary) Assessment & Plan:  Reviewed previous records from [facility] with patient.  Explained to patient differences between inflammatory and osteoarthritis.  Referral placed to physical therapy.  Discussed consideration of corticosteroid injections versus gel injections if symptoms do not improve. Orders: -     Ambulatory referral to Physical Therapy Evaluation and Treatment 2. Need for influenza vaccination Assessment & Plan: Risks and benefits of the Influenza vaccine have been discussed with the patient/care giver.  Patient/care giver concerns were answered to their satisfaction.  Signs and symptoms of adverse effects and when to seek medical attention if adverse effects occur were discussed with the patient/care giver.   Orders: -     Fluad Trivalent Adjuvanted   No follow-ups on file.    Patient's Medications       * Accurate as of December 11, 2023 12:29 PM. Reflects encounter med changes as of last refresh          Continued Medications      Instructions  amiodarone  200 mg tablet Commonly known as: PACERONE   200 mg, Every morning   apixaban  5 mg tablet Commonly known as: ELIQUIS   5 mg, 2 times a day   atorvastatin  20 mg tablet Commonly known as: LIPITOR  20 mg, Oral, Daily   carvedilol  25 mg tablet Commonly known as: COREG   25 mg, Oral, Two times a day with meals   cholecalciferol 1,000 units (25 mcg) tablet Commonly known as: VITAMIN D-3  1,000 Units, Daily   Cyanocobalamin  5000 MCG Tbdp  5,000 mcg   FERROUS SULFATE  IRON PO  2.5 mLs, As needed   folic acid  400 MCG tablet Commonly known as: FOLVITE   400 mcg, Daily   gabapentin  100 mg  capsule Commonly known as: NEURONTIN   100 mg, 3 times a day   hydrALAZINE  HCl 50 mg tablet Commonly known as: APRESOLINE   50 mg   isosorbide  mononitrate 20 mg tablet Commonly known as: ISMO ,MONOKET   20 mg, Oral, Every morning   levothyroxine  sodium 25 mcg tablet Commonly known as: SYNTHROID ,LOVOTHROID,LEVOXYL   25 mcg, Oral, Every morning   meclizine  HCl 12.5 mg tablet Commonly known as: ANTIVERT   12.5 mg, Oral, 3 times a day as needed   nitroGLYCERIN  0.4 mg SL tablet Commonly known as: NITROSTAT   0.4 mg, Every 5 minutes as needed   ondansetron  4 mg disintegrating tablet Commonly known as: ZOFRAN -ODT  4 mg, Every 8 hours as needed   pantoprazole  sodium 40 mg tablet Commonly known as: PROTONIX   40 mg, 2 times a day   Potassium Chloride  ER 20 MEQ Tbcr  1 tablet, Oral, Daily   valsartan -hydrochlorothiazide  160-25 MG per tablet Commonly known as: DIOVAN -HCT  1 tablet, Every morning       Modified Medications      Instructions  busPIRone 5 mg tablet Commonly known as: BUSPAR What changed:  when to take this reasons to take this  5 mg, Oral, 3 times daily        Orders Placed This Encounter  Procedures  . Fluad Trivalent Adjuvanted (age 70 and older/solid organ transplant recipients age 10-64)  . Ambulatory referral to Physical Therapy Evaluation and Treatment    Risks, benefits, and  alternatives of the medications and treatment plan prescribed today were discussed, and patient expressed understanding. Plan follow-up as discussed or as needed if any worsening symptoms or change in condition.    A yearly preventative health exam was recommended and current age based recommendations were discussed.   Subjective   Patient ID:  AWA BACHICHA is a 77 y.o. (DOB 17-Oct-1945) female    Patient presents with  . Pain    Located in feet, legs, and arms. Chornic. States that she has been Dx with RA by [facility]  Per [facility] notes it is Osteoarthrosis.      Ms. Klosinski  presents with arthralgias that began several months ago. Pain is located in multiple joints. The pain is described as constant, aching and boring.  Associated symptoms include: crepitation and erythema. The patient has tried nothing for pain relief. Related to injury: no.    Reviewed and updated this visit by provider: Tobacco  Allergies  Meds  Problems  Med Hx  Surg Hx  Fam Hx       Review of Systems  Constitutional:  Negative for activity change, appetite change and chills.  HENT:  Negative for congestion and dental problem.   Respiratory:  Negative for apnea, choking and chest tightness.   Cardiovascular:  Negative for chest pain.  Gastrointestinal:  Negative for abdominal distention and abdominal pain.  Genitourinary:  Negative for difficulty urinating and dyspareunia.  Musculoskeletal:  Positive for arthralgias and back pain.  Neurological:  Negative for dizziness and facial asymmetry.  Psychiatric/Behavioral:  Negative for agitation and behavioral problems.     Objective   Vitals:   12/11/23 0957 12/11/23 1023  BP: (!) 152/88 149/84  Patient Position: Sitting   Pulse: 64   Temp: 97.7 F (36.5 C)   TempSrc: Temporal   Resp: 16   Height: 5' 4 (1.626 m)   Weight: 239 lb 9.6 oz (108.7 kg)   SpO2: 97%   BMI (Calculated): 41.1   PainSc: 0-No pain   PainLoc: Generalized     General:  Pleasant woman,No acute distress HEENT: Normocephalic, atraumatic Neck:  Supple with normal range of motion; No lymphadenopathy  CV:  S1S2, Regular rate and rhythm, without murmur, gallops or rubs Lungs:  Clear to auscultation bilaterally with normal effort Skin:  No focal rashes  Extremities:  No clubbing, cyanosis or edema.  Dorsalis pedis/posterior tibial pulses 2+ Neuro:  No focal deficits.Alert and oriented x 4   *This note was dictated with voice recognition software. Inadvertently, similar sounding words may sometimes get transcribed incorrectly*    *Some images could  not be shown.

## 2023-12-10 ENCOUNTER — Ambulatory Visit: Attending: Cardiovascular Disease | Admitting: Cardiovascular Disease

## 2023-12-10 ENCOUNTER — Encounter: Payer: Self-pay | Admitting: Cardiovascular Disease

## 2023-12-10 VITALS — BP 194/71 | HR 65 | Ht 65.0 in | Wt 235.0 lb

## 2023-12-10 DIAGNOSIS — I2510 Atherosclerotic heart disease of native coronary artery without angina pectoris: Secondary | ICD-10-CM

## 2023-12-10 DIAGNOSIS — E785 Hyperlipidemia, unspecified: Secondary | ICD-10-CM

## 2023-12-10 DIAGNOSIS — N1832 Chronic kidney disease, stage 3b: Secondary | ICD-10-CM | POA: Diagnosis not present

## 2023-12-10 DIAGNOSIS — I10 Essential (primary) hypertension: Secondary | ICD-10-CM

## 2023-12-10 DIAGNOSIS — I779 Disorder of arteries and arterioles, unspecified: Secondary | ICD-10-CM

## 2023-12-10 DIAGNOSIS — I739 Peripheral vascular disease, unspecified: Secondary | ICD-10-CM

## 2023-12-10 DIAGNOSIS — I480 Paroxysmal atrial fibrillation: Secondary | ICD-10-CM

## 2023-12-10 MED ORDER — GABAPENTIN 100 MG PO CAPS: 100.0000 mg | ORAL_CAPSULE | Freq: Three times a day (TID) | ORAL | 5 refills | Status: AC

## 2023-12-10 NOTE — Progress Notes (Signed)
 Cardiology Office Note   Date:  12/10/2023   ID:  Destiny Rice, DOB 08-30-1945, MRN [id number]  PCP:  Reita Locus, MD  Cardiologist: Dr. CLEMENTEEN Mulch  No chief complaint on file.     History of Present Illness: Destiny Rice is a 78 y.o. female who is here today for a follow-up visit regarding peripheral arterial disease. She has known history of aortic valve disease status post TAVR, paroxysmal atrial fibrillation, carotid artery disease, chronic kidney disease, hyperlipidemia and diabetes mellitus. She was seen in 2024 for a wound on the lateral right ankle.  Noninvasive vascular studies showed noncompressible vessels with abnormal TBI.  Angiography in May 2024 showed no significant aortoiliac disease.  On the right side, there was severe stenosis in the distal SFA, significant disease in the TP trunk and proximal peroneal artery which was the only patent vessel below the knee.  This reconstituted the dorsalis pedis and posterior tibial via the communicating branches.  I performed successful intravascular lithotripsy and drug-coated balloon angioplasty to the distal right SFA/TP trunk and proximal peroneal artery.  The wound ultimately healed with no issues. She had follow-up Doppler studies in May which showed an ABI of 0.92 bilaterally with patent vessels by duplex.  She now returns for significant bilateral foot pain and tenderness with some swelling over the last month.  The pain is constant and also affects the thighs.  She has tender spots and significant numbness.  She is diabetic but does not take medications.  Most recent hemoglobin A1c was 6.8.      Past Medical History:  Diagnosis Date   AF (paroxysmal atrial fibrillation) (HCC) 03/10/2020   Allergy    Chronic kidney disease, stage 3b (HCC) 03/10/2020   Coronary artery disease    10 stents -started 2014   Hypertension    Hypothyroidism 03/10/2020   Lipoma 2014   heart   Mixed hyperlipidemia due to type 2 diabetes  mellitus (HCC) 03/10/2020   PONV (postoperative nausea and vomiting)    Type 2 diabetes mellitus with stage 3b chronic kidney disease, without long-term current use of insulin  (HCC) 03/10/2020    Past Surgical History:  Procedure Laterality Date   ABDOMINAL AORTOGRAM W/LOWER EXTREMITY N/A 08/22/2022   Procedure: ABDOMINAL AORTOGRAM W/LOWER EXTREMITY;  Surgeon: Darron Deatrice LABOR, MD;  [facility]: [facility];  Service: Cardiovascular;  Laterality: N/A;   ABDOMINAL HYSTERECTOMY     and bso   CAROTID ENDARTERECTOMY Left 2021   CORONARY ANGIOPLASTY WITH STENT PLACEMENT     x10   MITRAL VALVE REPAIR  12/09/2012   PERIPHERAL INTRAVASCULAR LITHOTRIPSY Right 08/22/2022   Procedure: PERIPHERAL INTRAVASCULAR LITHOTRIPSY;  Surgeon: Darron Deatrice LABOR, MD;  Location: [facility];  Service: Cardiovascular;  Laterality: Right;  R SFA, TP trunk and PT   PERIPHERAL VASCULAR BALLOON ANGIOPLASTY Right 08/22/2022   Procedure: PERIPHERAL VASCULAR BALLOON ANGIOPLASTY;  Surgeon: Darron Deatrice LABOR, MD;  Location: [facility];  Service: Cardiovascular;  Laterality: Right;  R SFA, TP trunk and PT   TRANSCATHETER AORTIC VALVE REPLACEMENT, 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     Current Outpatient Medications  Medication Sig Dispense Refill   gabapentin  (NEURONTIN ) 100 MG capsule Take 1 capsule (100 mg total) by mouth 3 (three) times daily. 90 capsule 5   ALPRAZolam  (XANAX ) 0.25 MG tablet Take 0.25 mg by mouth at bedtime as needed for anxiety.     amiodarone  (PACERONE ) 200 MG tablet TAKE 1 TABLET BY MOUTH DAILY 90  tablet 3   apixaban  (ELIQUIS ) 5 MG TABS tablet Take 1 tablet (5 mg total) by mouth 2 (two) times daily. 56 tablet    atorvastatin  (LIPITOR) 40 MG tablet Take 1 tablet (40 mg total) by mouth daily. 90 tablet 3   Calcium  Carb-Cholecalciferol (CALCIUM  600 + D PO) Take 1 tablet by mouth daily.     carvedilol  (COREG ) 25 MG tablet Take 1 tablet (25 mg total) by mouth 2 (two) times daily with a meal. 180  tablet 3   cholecalciferol (VITAMIN D3) 25 MCG (1000 UNIT) tablet Take 1,000 Units by mouth daily.     Cyanocobalamin  (VITAMIN B-12) 5000 MCG TBDP Take 5,000 mcg by mouth daily.     ELIQUIS  5 MG TABS tablet TAKE 1 TABLET(5 MG) BY MOUTH TWICE DAILY 60 tablet 5   Ferrous Sulfate (IRON PO) Take 2.5 mLs by mouth in the morning and at bedtime. Liquid Iron     fluticasone (FLONASE) 50 MCG/ACT nasal spray Place 1 spray into both nostrils daily as needed for allergies or rhinitis.     folic acid  (FOLVITE ) 400 MCG tablet Take 400 mcg by mouth daily.     hydrALAZINE  (APRESOLINE ) 50 MG tablet TAKE 1 TABLET BY MOUTH TWICE DAILY 180 tablet 3   isosorbide  mononitrate (IMDUR ) 30 MG 24 hr tablet Take 1 tablet (30 mg total) by mouth daily. 90 tablet 3   levothyroxine  (SYNTHROID ) 25 MCG tablet Take 1 tablet (25 mcg total) by mouth daily before breakfast. 90 tablet 3   meclizine  (ANTIVERT ) 12.5 MG tablet Take 1 tablet (12.5 mg total) by mouth 3 (three) times daily as needed for dizziness. 180 tablet 1   nitroGLYCERIN  (NITROSTAT ) 0.4 MG SL tablet Place 1 tablet (0.4 mg total) under the tongue every 5 (five) minutes as needed for chest pain. 25 tablet 0   pantoprazole  (PROTONIX ) 40 MG tablet Take 1 tablet (40 mg total) by mouth 2 (two) times daily before a meal. 180 tablet 3   potassium chloride  SA (KLOR-CON  M20) 20 MEQ tablet Take 1 tablet (20 mEq total) by mouth daily. 90 tablet 3   valsartan -hydrochlorothiazide  (DIOVAN -HCT) 160-25 MG tablet Take 1 tablet by mouth daily. 90 tablet 3   No current facility-administered medications for this visit.    Allergies:   Iodine -131 and Sulfa antibiotics    Social History:  The patient  reports that she has never smoked. She has never used smokeless tobacco. She reports that she does not currently use alcohol. She reports that she does not use drugs.   Family History:  The patient's family history includes Alcohol abuse in her father and mother; Hypertension in her mother  and sister; Stroke in her maternal grandmother.    ROS:  Please see the history of present illness.   Otherwise, review of systems are positive for none.   All other systems are reviewed and negative.    PHYSICAL EXAM: VS:  BP (!) 194/71 (BP Location: Left Arm, Patient Position: Sitting)   Pulse 65   Ht 5' 5 (1.651 m)   Wt 235 lb (106.6 kg)   SpO2 98%   BMI 39.11 kg/m  , BMI Body mass index is 39.11 kg/m. GEN: Well nourished, well developed, in no acute distress  HEENT: normal  Neck: no JVD, carotid bruits, or masses Cardiac: RRR; no rubs, or gallops, 2/ 6 systolic murmur in the aortic area.  In addition, there is 2/6 decrescendo diastolic murmur. Respiratory:  clear to auscultation bilaterally, normal work of breathing  GI: soft, nontender, nondistended, + BS MS: no deformity or atrophy  Skin: warm and dry, no rash Neuro:  Strength and sensation are intact Psych: euthymic mood, full affect Vascular: Her dorsalis pedis is palpable on the left side   EKG:  EKG is not ordered today.    Recent Labs: No results found for requested labs within last 365 days.    Lipid Panel    Component Value Date/Time   CHOL 189 07/30/2022 1530   CHOL 208 (H) 02/26/2022 1026   TRIG 85.0 07/30/2022 1530   HDL 91.40 07/30/2022 1530   HDL 105 02/26/2022 1026   CHOLHDL 2 07/30/2022 1530   VLDL 17.0 07/30/2022 1530   LDLCALC 80 07/30/2022 1530   LDLCALC 91 02/26/2022 1026      Wt Readings from Last 3 Encounters:  12/10/23 235 lb (106.6 kg)  07/04/23 235 lb (106.6 kg)  04/16/23 244 lb 9.6 oz (110.9 kg)           No data to display            ASSESSMENT AND PLAN:  1.  Peripheral arterial disease: Status post endovascular intervention on the right lower extremity for critical limb ischemia with healing of the wound.  Most recent Doppler studies in May of this year showed low normal ABI with patent site of intervention.  2.  Paroxysmal atrial fibrillation: On Eliquis .  3.   Coronary artery disease involving native coronary arteries without angina: Continue medical therapy.  4.  Status post TAVR and mitral valve repair: Stable on most recent echocardiogram.  5.  Hyperlipidemia: Continue treatment with atorvastatin  with a target LDL of less than 70.   6.  Essential hypertension: Blood pressure is elevated but she attributes this to significant discomfort.  7.  Carotid artery disease status post left CEA with known chronically occluded right carotid artery.  8.  Chronic kidney disease: stable.  9.  Bilateral leg pain and tenderness with numbness.  Based on the description of her symptoms, this does not seem to be vascular in etiology and she does have a palpable dorsalis pedis by exam on the left.  Suspect neuropathic component.  I elected to start her on a small dose gabapentin  asked her to follow-up with her primary care physician regarding this.    Disposition:   FU with me in 6 month  Signed,  Deatrice Cage, MD  12/10/2023 12:14 PM    [facility]

## 2023-12-10 NOTE — Patient Instructions (Signed)
 Medication Instructions:  START Gabapentin  100 mg three times daily  *If you need a refill on your cardiac medications before your next appointment, please call your pharmacy*  Lab Work: None ordered If you have labs (blood work) drawn today and your tests are completely normal, you will receive your results only by: MyChart Message (if you have MyChart) OR A paper copy in the mail If you have any lab test that is abnormal or we need to change your treatment, we will call you to review the results.  Testing/Procedures: None ordered  Follow-Up: At [facility], you and your health needs are our priority.  As part of our continuing mission to provide you with exceptional heart care, our providers are all part of one team.  This team includes your primary Cardiologist (physician) and Advanced Practice Providers or APPs (Physician Assistants and Nurse Practitioners) who all work together to provide you with the care you need, when you need it.  Your next appointment:   6 month(s)  Provider:   Dr. Darron  We recommend signing up for the patient portal called MyChart.  Sign up information is provided on this After Visit Summary.  MyChart is used to connect with patients for Virtual Visits (Telemedicine).  Patients are able to view lab/test results, encounter notes, upcoming appointments, etc.  Non-urgent messages can be sent to your provider as well.   To learn more about what you can do with MyChart, go to [email or web address].

## 2023-12-10 NOTE — Telephone Encounter (Signed)
 Called pt back. She was able to see Dr. Darron today in clinic. Concerns were addressed. Pt will follow up as planned.

## 2023-12-11 DIAGNOSIS — M150 Primary generalized (osteo)arthritis: Secondary | ICD-10-CM | POA: Diagnosis not present

## 2023-12-11 DIAGNOSIS — E1159 Type 2 diabetes mellitus with other circulatory complications: Secondary | ICD-10-CM | POA: Diagnosis not present

## 2023-12-11 DIAGNOSIS — I152 Hypertension secondary to endocrine disorders: Secondary | ICD-10-CM | POA: Diagnosis not present

## 2023-12-11 DIAGNOSIS — Z23 Encounter for immunization: Secondary | ICD-10-CM | POA: Diagnosis not present

## 2023-12-11 NOTE — Progress Notes (Signed)
 Prior to vaccine administration appropriate Vaccine Information Sheets provided to patient/guardian.  All questions/concerns addressed.  Vaccines administered per Alyannah Sanks order after a double check was performed by Arnaldo Collet, CCMA.  Patient/Guardian instructed to wait in the office for 15 minutes for observation of any adverse reactions.

## 2023-12-19 ENCOUNTER — Inpatient Hospital Stay (HOSPITAL_COMMUNITY)
Admission: EM | Admit: 2023-12-19 | Discharge: 2023-12-25 | DRG: 812 | Disposition: A | Discharge status: Home / Self Care | Attending: Internal Medicine | Admitting: Internal Medicine

## 2023-12-19 ENCOUNTER — Other Ambulatory Visit: Payer: Self-pay

## 2023-12-19 ENCOUNTER — Telehealth: Payer: Self-pay | Admitting: Cardiovascular Disease

## 2023-12-19 ENCOUNTER — Encounter (HOSPITAL_COMMUNITY): Payer: Self-pay

## 2023-12-19 ENCOUNTER — Emergency Department (HOSPITAL_COMMUNITY)

## 2023-12-19 DIAGNOSIS — Z733 Stress, not elsewhere classified: Secondary | ICD-10-CM

## 2023-12-19 DIAGNOSIS — E1122 Type 2 diabetes mellitus with diabetic chronic kidney disease: Secondary | ICD-10-CM | POA: Diagnosis not present

## 2023-12-19 DIAGNOSIS — K2950 Unspecified chronic gastritis without bleeding: Secondary | ICD-10-CM | POA: Diagnosis not present

## 2023-12-19 DIAGNOSIS — Z952 Presence of prosthetic heart valve: Secondary | ICD-10-CM

## 2023-12-19 DIAGNOSIS — K222 Esophageal obstruction: Secondary | ICD-10-CM | POA: Diagnosis not present

## 2023-12-19 DIAGNOSIS — I130 Hypertensive heart and chronic kidney disease with heart failure and stage 1 through stage 4 chronic kidney disease, or unspecified chronic kidney disease: Secondary | ICD-10-CM | POA: Diagnosis not present

## 2023-12-19 DIAGNOSIS — Z531 Procedure and treatment not carried out because of patient's decision for reasons of belief and group pressure: Secondary | ICD-10-CM | POA: Diagnosis present

## 2023-12-19 DIAGNOSIS — D649 Anemia, unspecified: Principal | ICD-10-CM

## 2023-12-19 DIAGNOSIS — E7849 Other hyperlipidemia: Secondary | ICD-10-CM | POA: Diagnosis not present

## 2023-12-19 DIAGNOSIS — K8020 Calculus of gallbladder without cholecystitis without obstruction: Secondary | ICD-10-CM | POA: Diagnosis present

## 2023-12-19 DIAGNOSIS — N1832 Chronic kidney disease, stage 3b: Secondary | ICD-10-CM | POA: Diagnosis not present

## 2023-12-19 DIAGNOSIS — D509 Iron deficiency anemia, unspecified: Principal | ICD-10-CM | POA: Diagnosis present

## 2023-12-19 DIAGNOSIS — K449 Diaphragmatic hernia without obstruction or gangrene: Secondary | ICD-10-CM | POA: Diagnosis present

## 2023-12-19 DIAGNOSIS — I480 Paroxysmal atrial fibrillation: Secondary | ICD-10-CM | POA: Diagnosis not present

## 2023-12-19 DIAGNOSIS — Z888 Allergy status to other drugs, medicaments and biological substances status: Secondary | ICD-10-CM

## 2023-12-19 DIAGNOSIS — Z8679 Personal history of other diseases of the circulatory system: Secondary | ICD-10-CM

## 2023-12-19 DIAGNOSIS — E782 Mixed hyperlipidemia: Secondary | ICD-10-CM | POA: Diagnosis not present

## 2023-12-19 DIAGNOSIS — C229 Malignant neoplasm of liver, not specified as primary or secondary: Secondary | ICD-10-CM | POA: Diagnosis not present

## 2023-12-19 DIAGNOSIS — E039 Hypothyroidism, unspecified: Secondary | ICD-10-CM | POA: Diagnosis not present

## 2023-12-19 DIAGNOSIS — K2990 Gastroduodenitis, unspecified, without bleeding: Secondary | ICD-10-CM | POA: Diagnosis present

## 2023-12-19 DIAGNOSIS — E66812 Obesity, class 2: Secondary | ICD-10-CM | POA: Diagnosis present

## 2023-12-19 DIAGNOSIS — K769 Liver disease, unspecified: Secondary | ICD-10-CM | POA: Diagnosis present

## 2023-12-19 DIAGNOSIS — Z7984 Long term (current) use of oral hypoglycemic drugs: Secondary | ICD-10-CM

## 2023-12-19 DIAGNOSIS — R001 Bradycardia, unspecified: Secondary | ICD-10-CM | POA: Diagnosis not present

## 2023-12-19 DIAGNOSIS — N179 Acute kidney failure, unspecified: Secondary | ICD-10-CM | POA: Diagnosis present

## 2023-12-19 DIAGNOSIS — Z1152 Encounter for screening for COVID-19: Secondary | ICD-10-CM

## 2023-12-19 DIAGNOSIS — I160 Hypertensive urgency: Secondary | ICD-10-CM | POA: Diagnosis not present

## 2023-12-19 DIAGNOSIS — R1900 Intra-abdominal and pelvic swelling, mass and lump, unspecified site: Secondary | ICD-10-CM | POA: Diagnosis not present

## 2023-12-19 DIAGNOSIS — E119 Type 2 diabetes mellitus without complications: Secondary | ICD-10-CM | POA: Diagnosis not present

## 2023-12-19 DIAGNOSIS — Z882 Allergy status to sulfonamides status: Secondary | ICD-10-CM

## 2023-12-19 DIAGNOSIS — R160 Hepatomegaly, not elsewhere classified: Secondary | ICD-10-CM | POA: Diagnosis present

## 2023-12-19 DIAGNOSIS — J310 Chronic rhinitis: Secondary | ICD-10-CM | POA: Diagnosis present

## 2023-12-19 DIAGNOSIS — I10 Essential (primary) hypertension: Secondary | ICD-10-CM | POA: Diagnosis not present

## 2023-12-19 DIAGNOSIS — E785 Hyperlipidemia, unspecified: Secondary | ICD-10-CM | POA: Insufficient documentation

## 2023-12-19 DIAGNOSIS — Z6839 Body mass index (BMI) 39.0-39.9, adult: Secondary | ICD-10-CM

## 2023-12-19 DIAGNOSIS — Z90710 Acquired absence of both cervix and uterus: Secondary | ICD-10-CM

## 2023-12-19 DIAGNOSIS — I050 Rheumatic mitral stenosis: Secondary | ICD-10-CM | POA: Diagnosis not present

## 2023-12-19 DIAGNOSIS — K5730 Diverticulosis of large intestine without perforation or abscess without bleeding: Secondary | ICD-10-CM | POA: Diagnosis not present

## 2023-12-19 DIAGNOSIS — N184 Chronic kidney disease, stage 4 (severe): Secondary | ICD-10-CM | POA: Diagnosis not present

## 2023-12-19 DIAGNOSIS — I2510 Atherosclerotic heart disease of native coronary artery without angina pectoris: Secondary | ICD-10-CM | POA: Diagnosis not present

## 2023-12-19 DIAGNOSIS — E038 Other specified hypothyroidism: Secondary | ICD-10-CM | POA: Diagnosis not present

## 2023-12-19 DIAGNOSIS — I517 Cardiomegaly: Secondary | ICD-10-CM | POA: Diagnosis not present

## 2023-12-19 DIAGNOSIS — Z79890 Hormone replacement therapy (postmenopausal): Secondary | ICD-10-CM

## 2023-12-19 DIAGNOSIS — Z7901 Long term (current) use of anticoagulants: Secondary | ICD-10-CM | POA: Diagnosis not present

## 2023-12-19 DIAGNOSIS — Z8249 Family history of ischemic heart disease and other diseases of the circulatory system: Secondary | ICD-10-CM

## 2023-12-19 DIAGNOSIS — E1151 Type 2 diabetes mellitus with diabetic peripheral angiopathy without gangrene: Secondary | ICD-10-CM | POA: Diagnosis present

## 2023-12-19 DIAGNOSIS — E1169 Type 2 diabetes mellitus with other specified complication: Secondary | ICD-10-CM | POA: Diagnosis present

## 2023-12-19 DIAGNOSIS — R932 Abnormal findings on diagnostic imaging of liver and biliary tract: Secondary | ICD-10-CM | POA: Diagnosis not present

## 2023-12-19 DIAGNOSIS — K317 Polyp of stomach and duodenum: Secondary | ICD-10-CM | POA: Diagnosis not present

## 2023-12-19 DIAGNOSIS — Z91041 Radiographic dye allergy status: Secondary | ICD-10-CM

## 2023-12-19 DIAGNOSIS — R933 Abnormal findings on diagnostic imaging of other parts of digestive tract: Secondary | ICD-10-CM

## 2023-12-19 DIAGNOSIS — R0600 Dyspnea, unspecified: Principal | ICD-10-CM

## 2023-12-19 DIAGNOSIS — Z79899 Other long term (current) drug therapy: Secondary | ICD-10-CM

## 2023-12-19 DIAGNOSIS — Z955 Presence of coronary angioplasty implant and graft: Secondary | ICD-10-CM

## 2023-12-19 DIAGNOSIS — K2970 Gastritis, unspecified, without bleeding: Secondary | ICD-10-CM | POA: Diagnosis present

## 2023-12-19 DIAGNOSIS — I739 Peripheral vascular disease, unspecified: Secondary | ICD-10-CM | POA: Insufficient documentation

## 2023-12-19 DIAGNOSIS — K7689 Other specified diseases of liver: Secondary | ICD-10-CM | POA: Diagnosis not present

## 2023-12-19 DIAGNOSIS — R5383 Other fatigue: Secondary | ICD-10-CM | POA: Diagnosis not present

## 2023-12-19 DIAGNOSIS — J069 Acute upper respiratory infection, unspecified: Secondary | ICD-10-CM | POA: Diagnosis present

## 2023-12-19 DIAGNOSIS — R7989 Other specified abnormal findings of blood chemistry: Secondary | ICD-10-CM | POA: Diagnosis not present

## 2023-12-19 DIAGNOSIS — R42 Dizziness and giddiness: Secondary | ICD-10-CM | POA: Diagnosis not present

## 2023-12-19 DIAGNOSIS — D631 Anemia in chronic kidney disease: Secondary | ICD-10-CM | POA: Diagnosis not present

## 2023-12-19 DIAGNOSIS — C801 Malignant (primary) neoplasm, unspecified: Secondary | ICD-10-CM | POA: Diagnosis not present

## 2023-12-19 DIAGNOSIS — R0602 Shortness of breath: Secondary | ICD-10-CM | POA: Diagnosis not present

## 2023-12-19 DIAGNOSIS — J90 Pleural effusion, not elsewhere classified: Secondary | ICD-10-CM | POA: Diagnosis not present

## 2023-12-19 DIAGNOSIS — Z538 Procedure and treatment not carried out for other reasons: Secondary | ICD-10-CM | POA: Diagnosis not present

## 2023-12-19 DIAGNOSIS — I129 Hypertensive chronic kidney disease with stage 1 through stage 4 chronic kidney disease, or unspecified chronic kidney disease: Secondary | ICD-10-CM | POA: Diagnosis not present

## 2023-12-19 DIAGNOSIS — K2960 Other gastritis without bleeding: Secondary | ICD-10-CM | POA: Diagnosis not present

## 2023-12-19 LAB — COMPREHENSIVE METABOLIC PANEL WITH GFR
ALT: 28 U/L (ref 0–44)
AST: 31 U/L (ref 15–41)
Albumin: 3.3 g/dL — ABNORMAL LOW (ref 3.5–5.0)
Alkaline Phosphatase: 57 U/L (ref 38–126)
Anion gap: 12 (ref 5–15)
BUN: 27 mg/dL — ABNORMAL HIGH (ref 8–23)
CO2: 22 mmol/L (ref 22–32)
Calcium: 8.6 mg/dL — ABNORMAL LOW (ref 8.9–10.3)
Chloride: 103 mmol/L (ref 98–111)
Creatinine, Ser: 2 mg/dL — ABNORMAL HIGH (ref 0.44–1.00)
GFR, Estimated: 25 mL/min — ABNORMAL LOW (ref >60)
Glucose, Bld: 121 mg/dL — ABNORMAL HIGH (ref 70–99)
Potassium: 4.1 mmol/L (ref 3.5–5.1)
Sodium: 137 mmol/L (ref 135–145)
Total Bilirubin: 0.6 mg/dL (ref 0.0–1.2)
Total Protein: 6.6 g/dL (ref 6.5–8.1)

## 2023-12-19 LAB — CBC WITH DIFFERENTIAL/PLATELET
Abs Immature Granulocytes: 0.03 K/uL (ref 0.00–0.07)
Basophils Absolute: 0.1 K/uL (ref 0.0–0.1)
Basophils Relative: 1 %
Eosinophils Absolute: 0.1 K/uL (ref 0.0–0.5)
Eosinophils Relative: 1 %
HCT: 27.1 % — ABNORMAL LOW (ref 36.0–46.0)
Hemoglobin: 8 g/dL — ABNORMAL LOW (ref 12.0–15.0)
Immature Granulocytes: 1 %
Lymphocytes Relative: 12 %
Lymphs Abs: 0.8 K/uL (ref 0.7–4.0)
MCH: 23.1 pg — ABNORMAL LOW (ref 26.0–34.0)
MCHC: 29.5 g/dL — ABNORMAL LOW (ref 30.0–36.0)
MCV: 78.3 fL — ABNORMAL LOW (ref 80.0–100.0)
Monocytes Absolute: 0.5 K/uL (ref 0.1–1.0)
Monocytes Relative: 8 %
Neutro Abs: 4.9 K/uL (ref 1.7–7.7)
Neutrophils Relative %: 77 %
Platelets: 290 K/uL (ref 150–400)
RBC: 3.46 MIL/uL — ABNORMAL LOW (ref 3.87–5.11)
RDW: 16.9 % — ABNORMAL HIGH (ref 11.5–15.5)
WBC: 6.4 K/uL (ref 4.0–10.5)
nRBC: 0 % (ref 0.0–0.2)

## 2023-12-19 LAB — OCCULT BLOOD X 1 CARD TO LAB, STOOL: Fecal Occult Bld: NEGATIVE

## 2023-12-19 LAB — TROPONIN I (HIGH SENSITIVITY)
Troponin I (High Sensitivity): 15 ng/L (ref <18)
Troponin I (High Sensitivity): 17 ng/L (ref <18)

## 2023-12-19 LAB — BRAIN NATRIURETIC PEPTIDE: B Natriuretic Peptide: 697.1 pg/mL — ABNORMAL HIGH (ref 0.0–100.0)

## 2023-12-19 MED ORDER — FERROUS SULFATE 325 (65 FE) MG PO TABS
325.0000 mg | ORAL_TABLET | Freq: Every day | ORAL | Status: DC
  Administered 2023-12-20 – 2023-12-24 (×4): 325 mg via ORAL
  Filled 2023-12-19 (×4): qty 1

## 2023-12-19 MED ORDER — HYDRALAZINE HCL 20 MG/ML IJ SOLN
10.0000 mg | Freq: Four times a day (QID) | INTRAMUSCULAR | Status: DC | PRN
  Administered 2023-12-20 – 2023-12-25 (×8): 10 mg via INTRAVENOUS
  Filled 2023-12-19 (×8): qty 1

## 2023-12-19 MED ORDER — LEVOTHYROXINE SODIUM 25 MCG PO TABS
25.0000 ug | ORAL_TABLET | Freq: Every day | ORAL | Status: DC
  Administered 2023-12-20 – 2023-12-22 (×3): 25 ug via ORAL
  Filled 2023-12-19 (×5): qty 1

## 2023-12-19 MED ORDER — SODIUM CHLORIDE 0.9% FLUSH
3.0000 mL | Freq: Two times a day (BID) | INTRAVENOUS | Status: DC
  Administered 2023-12-20 – 2023-12-25 (×9): 3 mL via INTRAVENOUS

## 2023-12-19 MED ORDER — ISOSORBIDE MONONITRATE ER 30 MG PO TB24
30.0000 mg | ORAL_TABLET | Freq: Every day | ORAL | Status: DC
  Administered 2023-12-20 – 2023-12-25 (×6): 30 mg via ORAL
  Filled 2023-12-19 (×7): qty 1

## 2023-12-19 MED ORDER — APIXABAN 5 MG PO TABS
5.0000 mg | ORAL_TABLET | Freq: Two times a day (BID) | ORAL | Status: DC
Start: 2023-12-19 — End: 2023-12-23
  Administered 2023-12-20 – 2023-12-22 (×6): 5 mg via ORAL
  Filled 2023-12-19 (×6): qty 1

## 2023-12-19 MED ORDER — ACETAMINOPHEN 650 MG RE SUPP: 650.0000 mg | Freq: Four times a day (QID) | RECTAL | Status: DC | PRN

## 2023-12-19 MED ORDER — FOLIC ACID 1 MG PO TABS
500.0000 ug | ORAL_TABLET | Freq: Every day | ORAL | Status: DC
  Administered 2023-12-20 – 2023-12-25 (×5): 0.5 mg via ORAL
  Filled 2023-12-19 (×6): qty 1

## 2023-12-19 MED ORDER — SODIUM CHLORIDE 0.9% FLUSH: 3.0000 mL | INTRAVENOUS | Status: DC | PRN

## 2023-12-19 MED ORDER — ACETAMINOPHEN 325 MG PO TABS: 650.0000 mg | ORAL_TABLET | Freq: Four times a day (QID) | ORAL | Status: DC | PRN

## 2023-12-19 MED ORDER — MECLIZINE HCL 12.5 MG PO TABS
12.5000 mg | ORAL_TABLET | Freq: Three times a day (TID) | ORAL | Status: DC | PRN
  Administered 2023-12-21: 12.5 mg via ORAL
  Filled 2023-12-19 (×2): qty 1

## 2023-12-19 MED ORDER — GABAPENTIN 100 MG PO CAPS
100.0000 mg | ORAL_CAPSULE | Freq: Three times a day (TID) | ORAL | Status: DC
  Administered 2023-12-20 – 2023-12-25 (×13): 100 mg via ORAL
  Filled 2023-12-19 (×17): qty 1

## 2023-12-19 MED ORDER — HYDRALAZINE HCL 50 MG PO TABS
50.0000 mg | ORAL_TABLET | Freq: Two times a day (BID) | ORAL | Status: DC
  Administered 2023-12-20 – 2023-12-25 (×9): 50 mg via ORAL
  Filled 2023-12-19 (×5): qty 1
  Filled 2023-12-19: qty 2
  Filled 2023-12-19 (×5): qty 1

## 2023-12-19 MED ORDER — ATORVASTATIN CALCIUM 10 MG PO TABS
20.0000 mg | ORAL_TABLET | Freq: Every day | ORAL | Status: DC
  Administered 2023-12-20 – 2023-12-25 (×5): 20 mg via ORAL
  Filled 2023-12-19 (×6): qty 2

## 2023-12-19 MED ORDER — PANTOPRAZOLE SODIUM 40 MG PO TBEC
40.0000 mg | DELAYED_RELEASE_TABLET | Freq: Two times a day (BID) | ORAL | Status: AC
Start: 2023-12-20 — End: ?
  Administered 2023-12-20 – 2023-12-24 (×9): 40 mg via ORAL
  Filled 2023-12-19 (×9): qty 1

## 2023-12-19 MED ORDER — SODIUM CHLORIDE 0.9 % IV SOLN: 250.0000 mL | INTRAVENOUS | Status: AC | PRN

## 2023-12-19 MED ORDER — ALPRAZOLAM 0.25 MG PO TABS: 0.2500 mg | ORAL_TABLET | Freq: Every evening | ORAL | Status: DC | PRN

## 2023-12-19 MED ORDER — SODIUM CHLORIDE 0.9% FLUSH
3.0000 mL | Freq: Two times a day (BID) | INTRAVENOUS | Status: DC
  Administered 2023-12-20 – 2023-12-23 (×7): 3 mL via INTRAVENOUS

## 2023-12-19 MED ORDER — AMIODARONE HCL 200 MG PO TABS
200.0000 mg | ORAL_TABLET | Freq: Every day | ORAL | Status: DC
  Administered 2023-12-20 – 2023-12-25 (×5): 200 mg via ORAL
  Filled 2023-12-19 (×6): qty 1

## 2023-12-19 MED ORDER — CARVEDILOL 12.5 MG PO TABS: 25.0000 mg | ORAL_TABLET | Freq: Two times a day (BID) | ORAL | Status: DC

## 2023-12-19 NOTE — ED Provider Triage Note (Signed)
 Emergency Medicine Provider Triage Evaluation Note  Destiny Rice , a 78 y.o. female  was evaluated in triage.  Pt complains of chest discomfort fatigue.  Patient reports that she was at her kidney doctors today her blood pressure was elevated she asked her a lot of questions about having blood in her urine and noted that her hemoglobin was downtrending told her that she needs to come to the emergency department.  She also apparently was having some complaints of chest pain with exertion this morning and which was relieved by taking some antacids.  Her cardiologist recommended she come into the emergency department.  She had a recent flu shot and was sick on her stomach on the 17th when she got it.  She has not had any symptoms since.  She denies any active chest pain or shortness of breath at this time..  Review of Systems  Positive: Chest pain Negative: Fever  Physical Exam  BP (!) 160/80   Pulse 64   Temp 97.8 F (36.6 C) (Oral)   Resp 18   Ht 5' 5 (1.651 m)   Wt 106.6 kg   SpO2 95%   BMI 39.11 kg/m  Gen:   Awake, no distress   Resp:  Normal effort  MSK:   Moves extremities without difficulty  Other:    Medical Decision Making  Medically screening exam initiated at 3:56 PM.  Appropriate orders placed.  ROSANNE Rice was informed that the remainder of the evaluation will be completed by another provider, this initial triage assessment does not replace that evaluation, and the importance of remaining in the ED until their evaluation is complete.     Destiny Chroman, PA-C 12/20/23 1952

## 2023-12-19 NOTE — Telephone Encounter (Signed)
   Pt c/o of Chest Pain: STAT if active CP, including tightness, pressure, jaw pain, radiating pain to shoulder/upper arm/back, CP unrelieved by Nitro. Symptoms reported of SOB, nausea, vomiting, sweating.  1. Are you having CP right now? yes    2. Are you experiencing any other symptoms (ex. SOB, nausea, vomiting, sweating)? Nausea, sweating   3. Is your CP continuous or coming and going? Coming and going   4. Have you taken Nitroglycerin ? no   5. How long have you been experiencing CP?  1 week    6. If NO CP at time of call then end call with telling Pt to call back or call 911 if Chest pain returns prior to return call from triage team.

## 2023-12-19 NOTE — H&P (Incomplete)
 History and Physical    Destiny Rice FMW:[id number] DOB: Dec 10, 1945 DOA: 12/19/2023  PCP: Reita Locus, MD   Patient coming from: Home   Chief Complaint:  Chief Complaint  Patient presents with   Shortness of Breath   ED TRIAGE note:Patient reports got flu shot on the 18th and since has been sick to her stomach, weak,exertional dyspnea. Unable to tell my why her MD sent her here but was something about blood noted somewhere. Patient has CKD   HPI:  Destiny Rice is a 78 y.o. female with medical history significant of aortic stenosis status post TAVR, paroxysmal atrial fibrillation, CAD, CKD 3B, hyperlipidemia, non-insulin -dependent DM type II, primary osteoarthritis of multiple joint, peripheral artery disease and hypothyroidism presented emergency department complaining of shortness of breath, exertional dyspnea, generalized weakness and feeling sick in stomach after receiving flu vaccine on 12/11/2023.  Patient went to see the primary care physician and patient was referred to emergency department for further evaluation.  In the ED further workup revealed patient has low hemoglobin and symptomatic anemia however patient is Jehovah witness and declined blood transfusion however she is only okay to receive IV iron infusion if requires. Patient denies any headache, dizziness, chest pain, chest pressure, abdominal pain, nausea and vomiting.   ED Course:  At presentation to ED patient found hypertensive blood pressure 199/66 otherwise hemodynamically stable.  Afebrile. Troponin x 2 within normal range.  CBC unremarkable except low hemoglobin 8 and hematocrit 27 (baseline hemoglobin around 10-11).  Normal WBC Cartilade count.  BMP showing elevated creatinine 2 and GFR 25.  Elevated BNP 697. FOBT negative.  EKG showing junctional rhythm heart rate 59, right axis deviation, ST depression in lead II, 3, aVF and T wave inversion in V3 V4 V5 and V6.  Chest x-ray showing borderline  cardiomegaly. CT abdomen pelvis showing: IMPRESSION: 1. Interval development of a lobulated hypodense mass within the left hepatic dome measuring 5.3 x 3.1 cm and a second rounded hypodense lesion within segment 7 measuring 2.3 cm, indeterminate on this noncontrast examination. 2. Cholelithiasis without superimposed pericholecystic inflammatory change. No intra or extrahepatic biliary ductal dilation. 3. Colonic diverticulosis without superimposed acute inflammatory change. 4. Moderate hiatal hernia. 5. Peripheral vascular disease. Particularly prominent atherosclerotic calcifications at the origin of the mesenteric vasculature. If there is clinical evidence of acute or chronic mesenteric ischemia, CT arteriography may be more helpful for further evaluation.  Hospitalist has been consulted for further evaluation management of acute on chronic anemia, symptomatic anemia, AKI on CKD 3B and elevated BNP. Given patient has elevated BNP and EKG changes discussed case with cardiology Dr. Cesario low suspicion for perimyocarditis given normal troponin level and recommended to obtain echocardiogram and check ESR CRP.   Significant labs in the ED: Lab Orders         BNP (Order if Patient has history of Heart Failure)         Comprehensive metabolic panel         CBC with Differential         Occult blood card to lab, stool         Sedimentation rate         C-reactive protein         Comprehensive metabolic panel         CBC         Vitamin B12         Folate         Iron and TIBC  Ferritin         Reticulocytes         Hemoglobin A1c       Review of Systems:  Review of Systems  Constitutional:  Positive for malaise/fatigue. Negative for chills, fever and weight loss.  Respiratory:  Positive for shortness of breath. Negative for cough, sputum production and wheezing.   Cardiovascular:  Positive for orthopnea. Negative for chest pain, palpitations and leg swelling.   Gastrointestinal:  Negative for abdominal pain, blood in stool, heartburn, melena, nausea and vomiting.  Musculoskeletal:  Negative for joint pain, myalgias and neck pain.  Neurological:  Negative for dizziness and headaches.  Psychiatric/Behavioral:  The patient is not nervous/anxious.     Past Medical History:  Diagnosis Date   AF (paroxysmal atrial fibrillation) (HCC) 03/10/2020   Allergy    Chronic kidney disease, stage 3b (HCC) 03/10/2020   Coronary artery disease    10 stents -started 2014   Hypertension    Hypothyroidism 03/10/2020   Lipoma 2014   heart   Mixed hyperlipidemia due to type 2 diabetes mellitus (HCC) 03/10/2020   PONV (postoperative nausea and vomiting)    Type 2 diabetes mellitus with stage 3b chronic kidney disease, without long-term current use of insulin  (HCC) 03/10/2020    Past Surgical History:  Procedure Laterality Date   ABDOMINAL AORTOGRAM W/LOWER EXTREMITY N/A 08/22/2022   Procedure: ABDOMINAL AORTOGRAM W/LOWER EXTREMITY;  Surgeon: Darron Deatrice LABOR, MD;  Location: [facility];  Service: Cardiovascular;  Laterality: N/A;   ABDOMINAL HYSTERECTOMY     and bso   CAROTID ENDARTERECTOMY Left 2021   CORONARY ANGIOPLASTY WITH STENT PLACEMENT     x10   MITRAL VALVE REPAIR  12/09/2012   PERIPHERAL INTRAVASCULAR LITHOTRIPSY Right 08/22/2022   Procedure: PERIPHERAL INTRAVASCULAR LITHOTRIPSY;  Surgeon: Darron Deatrice LABOR, MD;  Location: [facility];  Service: Cardiovascular;  Laterality: Right;  R SFA, TP trunk and PT   PERIPHERAL VASCULAR BALLOON ANGIOPLASTY Right 08/22/2022   Procedure: PERIPHERAL VASCULAR BALLOON ANGIOPLASTY;  Surgeon: Darron Deatrice LABOR, MD;  Location: [facility];  Service: Cardiovascular;  Laterality: Right;  R SFA, TP trunk and PT   TRANSCATHETER AORTIC VALVE REPLACEMENT, 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     reports that she has never smoked. She has never used smokeless tobacco. She reports that she does not currently use  alcohol. She reports that she does not use drugs.  Allergies  Allergen Reactions   Oseltamivir Other (See Comments) and Swelling    In legs   Dapagliflozin     Other Reaction(s): UTI's   Iodine -131 Nausea Only   Metformin Hcl     Other Reaction(s): stopped due to kidney function   Sulfa Antibiotics Hives    Family History  Problem Relation Age of Onset   Hypertension Mother    Alcohol abuse Mother    Alcohol abuse Father    Hypertension Sister    Stroke Maternal Grandmother     Prior to Admission medications   Medication Sig Start Date End Date Taking? Authorizing Provider  ALPRAZolam  (XANAX ) 0.25 MG tablet Take 0.25 mg by mouth at bedtime as needed for anxiety.    [provider]  amiodarone  (PACERONE ) 200 MG tablet TAKE 1 TABLET BY MOUTH DAILY 04/30/23   Darron Deatrice LABOR, MD  apixaban  (ELIQUIS ) 5 MG TABS tablet Take 1 tablet (5 mg total) by mouth 2 (two) times daily. 01/01/23   O'NealDarryle Ned, MD  atorvastatin  (LIPITOR) 40 MG tablet  Take 1 tablet (40 mg total) by mouth daily. 01/24/23   O'NealDarryle Ned, MD  Calcium  Carb-Cholecalciferol (CALCIUM  600 + D PO) Take 1 tablet by mouth daily.    [provider]  carvedilol  (COREG ) 25 MG tablet Take 1 tablet (25 mg total) by mouth 2 (two) times daily with a meal. 05/29/22   Wendolyn Jenkins Jansky, MD  cholecalciferol (VITAMIN D3) 25 MCG (1000 UNIT) tablet Take 1,000 Units by mouth daily.    [provider]  Cyanocobalamin  (VITAMIN B-12) 5000 MCG TBDP Take 5,000 mcg by mouth daily.    [provider]  ELIQUIS  5 MG TABS tablet TAKE 1 TABLET(5 MG) BY MOUTH TWICE DAILY 07/26/23   [name], Darryle Ned, MD  Ferrous Sulfate  (IRON PO) Take 2.5 mLs by mouth in the morning and at bedtime. Liquid Iron    [provider]  fluticasone (FLONASE) 50 MCG/ACT nasal spray Place 1 spray into both nostrils daily as needed for allergies or rhinitis.    [provider]  folic acid  (FOLVITE ) 400 MCG  tablet Take 400 mcg by mouth daily.    [provider]  gabapentin  (NEURONTIN ) 100 MG capsule Take 1 capsule (100 mg total) by mouth 3 (three) times daily. 12/10/23   Darron Deatrice LABOR, MD  hydrALAZINE  (APRESOLINE ) 50 MG tablet TAKE 1 TABLET BY MOUTH TWICE DAILY 10/16/23   [name], Darryle Ned, MD  isosorbide  mononitrate (IMDUR ) 30 MG 24 hr tablet Take 1 tablet (30 mg total) by mouth daily. 05/10/23 08/08/23  Barbaraann Darryle Ned, MD  levothyroxine  (SYNTHROID ) 25 MCG tablet Take 1 tablet (25 mcg total) by mouth daily before breakfast. 05/29/22   Wendolyn Jenkins Jansky, MD  meclizine  (ANTIVERT ) 12.5 MG tablet Take 1 tablet (12.5 mg total) by mouth 3 (three) times daily as needed for dizziness. 01/29/22   Wendolyn Jenkins Jansky, MD  nitroGLYCERIN  (NITROSTAT ) 0.4 MG SL tablet Place 1 tablet (0.4 mg total) under the tongue every 5 (five) minutes as needed for chest pain. 05/29/22   O'NealDarryle Ned, MD  pantoprazole  (PROTONIX ) 40 MG tablet Take 1 tablet (40 mg total) by mouth 2 (two) times daily before a meal. 06/28/23   [name], Darryle Ned, MD  potassium chloride  SA (KLOR-CON  M20) 20 MEQ tablet Take 1 tablet (20 mEq total) by mouth daily. 01/29/22   Wendolyn Jenkins Jansky, MD  valsartan -hydrochlorothiazide  (DIOVAN -HCT) 160-25 MG tablet Take 1 tablet by mouth daily. 07/01/23   Barbaraann Darryle Ned, MD     Physical Exam: Vitals:   12/19/23 1911 12/19/23 2230 12/19/23 2243 12/20/23 0137  BP: (!) 192/71 (!) 199/66  (!) 165/58  Pulse: 60 62  65  Resp: 14 18 18  (!) 22  Temp: 97.6 F (36.4 C)  97.9 F (36.6 C)   TempSrc: Oral  Oral   SpO2: 100% 98%  98%  Weight:      Height:        Physical Exam Vitals and nursing note reviewed.  Constitutional:      General: She is not in acute distress.    Appearance: She is not ill-appearing.  Cardiovascular:     Rate and Rhythm: Regular rhythm. Bradycardia present.  Pulmonary:     Effort: Pulmonary effort is normal.     Breath sounds: Normal breath sounds. No  decreased breath sounds, wheezing, rhonchi or rales.  Abdominal:     Palpations: Abdomen is soft.  Musculoskeletal:     Cervical back: Neck supple.     Right lower leg: No tenderness.  Left lower leg: No tenderness.  Skin:    Capillary Refill: Capillary refill takes less than 2 seconds.  Neurological:     Mental Status: She is alert and oriented to person, place, and time.  Psychiatric:        Mood and Affect: Mood normal.      Labs on Admission: I have personally reviewed following labs and imaging studies  CBC: Recent Labs  Lab 12/19/23 1612  WBC 6.4  NEUTROABS 4.9  HGB 8.0*  HCT 27.1*  MCV 78.3*  PLT 290   Basic Metabolic Panel: Recent Labs  Lab 12/19/23 1612  NA 137  K 4.1  CL 103  CO2 22  GLUCOSE 121*  BUN 27*  CREATININE 2.00*  CALCIUM  8.6*   GFR: Estimated Creatinine Clearance: 27.7 mL/min (A) (by C-G formula based on SCr of 2 mg/dL (H)). Liver Function Tests: Recent Labs  Lab 12/19/23 1612  AST 31  ALT 28  ALKPHOS 57  BILITOT 0.6  PROT 6.6  ALBUMIN 3.3*   No results for input(s): LIPASE, AMYLASE in the last 168 hours. No results for input(s): AMMONIA in the last 168 hours. Coagulation Profile: No results for input(s): INR, PROTIME in the last 168 hours. Cardiac Enzymes: Recent Labs  Lab 12/19/23 1612 12/19/23 1926  TROPONINIHS 15 17   BNP (last 3 results) Recent Labs    12/19/23 1612  BNP 697.1*   HbA1C: No results for input(s): HGBA1C in the last 72 hours. CBG: No results for input(s): GLUCAP in the last 168 hours. Lipid Profile: No results for input(s): CHOL, HDL, LDLCALC, TRIG, CHOLHDL, LDLDIRECT in the last 72 hours. Thyroid  Function Tests: No results for input(s): TSH, T4TOTAL, FREET4, T3FREE, THYROIDAB in the last 72 hours. Anemia Panel: No results for input(s): VITAMINB12, FOLATE, FERRITIN, TIBC, IRON, RETICCTPCT in the last 72 hours. Urine analysis:    Component Value  Date/Time   COLORURINE YELLOW 03/10/2020 0330   APPEARANCEUR CLEAR 03/10/2020 0330   LABSPEC 1.012 03/10/2020 0330   PHURINE 6.0 03/10/2020 0330   GLUCOSEU NEGATIVE 03/10/2020 0330   HGBUR NEGATIVE 03/10/2020 0330   BILIRUBINUR NEGATIVE 03/10/2020 0330   KETONESUR NEGATIVE 03/10/2020 0330   PROTEINUR NEGATIVE 03/10/2020 0330   NITRITE NEGATIVE 03/10/2020 0330   LEUKOCYTESUR NEGATIVE 03/10/2020 0330    Radiological Exams on Admission: I have personally reviewed images CT ABDOMEN PELVIS WO CONTRAST Result Date: 12/19/2023 EXAM: CT ABDOMEN AND PELVIS WITHOUT CONTRAST 12/19/2023 07:53:00 PM TECHNIQUE: CT of the abdomen and pelvis was performed without the administration of intravenous contrast. Multiplanar reformatted images are provided for review. Automated exposure control, iterative reconstruction, and/or weight-based adjustment of the mA/kV was utilized to reduce the radiation dose to as low as reasonably achievable. COMPARISON: Comparison is made to March 10, 2020. CLINICAL HISTORY: Abdominal pain, acute, nonlocalized; ABD PAIN, low hgb, ckd. Chief complaints; Shortness of Breath; CT ABDOMEN PELVIS WO CONTRAST; Abdominal pain, acute, nonlocalized, ABD PAIN, low hgb, ckd. FINDINGS: LOWER CHEST: Hypoattenuation of the cardiac blood pool in keeping with at least moderate anemia. Moderate hiatal hernia. LIVER: Interval development of a lobulated hypodense mass within the left hepatic dome measuring 5.3 x 2.5 is not mentioned but a 5.3 x 3.1 cm mass at image 10/3 and a second rounded hypodense lesion within segment 7 measuring 2.3 cm at image 17/3. These are indeterminate on this noncontrast examination. GALLBLADDER AND BILE DUCTS: Cholelithiasis without superimposed pericholecystic inflammatory change. No intra or extrahepatic biliary ductal dilation. SPLEEN: No acute abnormality. PANCREAS: No acute  abnormality. ADRENAL GLANDS: No acute abnormality. KIDNEYS, URETERS AND BLADDER: Simple cortical  cysts are seen within the kidneys bilaterally for which no follow-up imaging is recommended. Vascular calcifications are noted within the renal hila bilaterally. No ureteral calculi. No hydronephrosis. No perinephric inflammatory stranding or fluid collections were identified. The bladder is unremarkable. GI AND BOWEL: Colonic diverticulosis without superimposed acute inflammatory change. The stomach, small bowel, and large bowel are otherwise unremarkable. Appendix is normal. PERITONEUM AND RETROPERITONEUM: No ascites. No free air. VASCULATURE: Extensive aortoiliac atherosclerotic calcification. Particularly prominent atherosclerotic calcifications at the origin of the mesenteric vasculature. LYMPH NODES: No lymphadenopathy. REPRODUCTIVE ORGANS: Status post hysterectomy. No adnexal mass. BONES AND SOFT TISSUES: Osseous structures are age appropriate. No acute bone abnormality. No lytic or blastic bone lesion. IMPRESSION: 1. Interval development of a lobulated hypodense mass within the left hepatic dome measuring 5.3 x 3.1 cm and a second rounded hypodense lesion within segment 7 measuring 2.3 cm, indeterminate on this noncontrast examination. 2. Cholelithiasis without superimposed pericholecystic inflammatory change. No intra or extrahepatic biliary ductal dilation. 3. Colonic diverticulosis without superimposed acute inflammatory change. 4. Moderate hiatal hernia. 5. Peripheral vascular disease. Particularly prominent atherosclerotic calcifications at the origin of the mesenteric vasculature. If there is clinical evidence of acute or chronic mesenteric ischemia, CT arteriography may be more helpful for further evaluation. Electronically signed by: Dorethia Molt MD 12/19/2023 08:05 PM EDT RP Workstation: [id number]   DG Chest 2 View Result Date: 12/19/2023 CLINICAL DATA:  Shortness of breath. EXAM: CHEST - 2 VIEW COMPARISON:  11/22/2022 FINDINGS: Borderline cardiomegaly. No pulmonary vascular congestion.  Postsurgical changes of valve replacement are seen. Lungs are clear. IMPRESSION: Borderline cardiomegaly. Electronically Signed   By: Aliene Lloyd M.D.   On: 12/19/2023 17:05    EKG: My personal interpretation of EKG shows:  Junctional rhythm heart rate 59, borderline ST to abnormality   Assessment/Plan: Principal Problem:   Symptomatic anemia Active Problems:   Hypertensive urgency   Paroxysmal atrial fibrillation (HCC)   CKD stage 3b, GFR 30-44 ml/min (HCC)   Non-insulin  dependent type 2 diabetes mellitus (HCC)   Hypothyroidism   History of aortic stenosis s/p TAVR   History of CAD (coronary artery disease)   Hyperlipidemia   Peripheral artery disease    Assessment and Plan: Symptomatic anemia Acute on chronic anemia Hypodense hepatic masses -Presented to emergency department complaining of orthopnea or dyspnea after receiving flu vaccine on Monday.  Patient went to see PCP and found to have low hemoglobin referred to ED for evaluation. -Presentation to ED patient found hypertensive after hide blood pressure has been improved.  Otherwise hemodynamically stable. -CBC showing hemoglobin 8, hematocrit 27, low MCV 78.  Baseline hemoglobin around 10-11.  Patient denies any dark stool, bright red blood per rectum hematemesis and hemoptysis.  Denies any previous GI bleed. -FOBT negative.   -CMP showing elevated creatinine normal hepatic function panel. The CT abdomen pelvis showing hepatic hypodense masses in 2 spots.  As comparing to previous CT abdomen 2021 these findings are new. -Unclear if etiology of hepatic mass/lesion. - Plan to continue monitor CBC if hemoglobin drop below 7 or patient develops active bleeding need to hold Eliquis . - Checking anemia panel and based on the result with replete with IV iron. - Consulted [facility] for further recommendation.  Elevated BNP New EKG change -Patient reported since she received flu vaccine on Monday developed shortness of breath,  malaise, poor appetite and abdominal fullness which is new.  Patient stated she received  flu vaccine every year however never experienced symptoms like this. -Initially found hypertensive otherwise hemodynamically stable.  Blood pressure improved.  Troponin x 2 within normal range. -Elevated BNP 697. - EKG showing  junctional rhythm heart rate 59, right axis deviation, ST depression in lead II, lll, aVF and T wave inversion in V3 V4 V5 and V6. -Checking ESR and CRP. - Patient denies any chest pain or chest pressure. - Discussed with on-call cardiology Dr. Cesario recommended to obtain echocardiogram first to assess baseline heart function.  Per cardiology there is no suspicion for perimyocarditis at this time based on patient's clinical presentation.  Paroxysmal atrial fibrillation -Given patient has low heart rate around 60 holding Coreg . - Continue amiodarone  200 mg daily and Eliquis  5 mg twice daily.  AKI on CKD stage IIIb -Elevated creatinine 2.  Baseline creatinine is around 1.6-1.8. Unclear etiology of acute kidney injury it could be from underlying anemia versus elevated blood pressure as well. - Holding IV fluid given unclear etiology.  Continue home blood pressure regimen with hydralazine  50 mg twice daily and Imdur  30 mg daily. -If blood pressure improves and renal function remains stable resume valsartan  and hydrochlorothiazide .  Essential hypertension - Continue hydralazine  and Imdur .  If renal function remains stable resume valsartan  hydrochlorothiazide .  If bradycardia improved resume Coreg .  Non-insulin -dependent DM type II -History of DM type II diet controlled.  Continue low sliding scale insulin  with mealtime as needed.  Hypothyroidism -Continue levothyroxine   History of TAVR and mitral valve repair - Unable to find out the timeline of TAVR and mitral valve repair.  History of CAD -Continue Eliquis , Lipitor.  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  Continue to monitor CBC if hemoglobin drops below 7 or patient develops active bleeding need to hold Eliquis . Consults: Gastroenterology Admission status:   Inpatient, Step Down Unit  Severity of Illness: The appropriate patient status for this patient is INPATIENT. Inpatient status is judged to be reasonable and necessary in order to provide the required intensity of service to ensure the patient's safety. The patient's presenting symptoms, physical exam findings, and initial radiographic and laboratory data in the context of their chronic comorbidities is felt to place them at high risk for further clinical deterioration. Furthermore, it is not anticipated that the patient will be medically stable for discharge from the hospital within 2 midnights of admission.   * I certify that at the point of admission it is my clinical judgment that the patient will require inpatient hospital care spanning beyond 2 midnights from the point of admission due to high intensity of service, high risk for further deterioration and high frequency of surveillance required.DEWAINE    Loxley Cibrian, MD [facility]  How to contact the [facility] Attending or Consulting provider 7A - 7P or covering provider during after hours 7P -7A, for this patient.  Check the care team in [facility] and look for a) attending/consulting [facility] provider listed and b) the [facility] team listed Log into [email or web address] and use [facility]'s universal password to access. If you do not have the password, please contact the hospital operator. Locate the [facility] provider you are looking for under [facility] and page to a number that you can be  directly reached. If  you still have difficulty reaching the provider, please page the [facility] (Director on Call) for the Hospitalists listed on amion for assistance.  12/20/2023, 2:15 AM

## 2023-12-19 NOTE — ED Triage Notes (Signed)
 Patient reports got flu shot on the 18th and since has been sick to her stomach, weak,exertional dyspnea.  Unable to tell my why her MD sent her here but was something about blood noted somewhere. Patient has CKD

## 2023-12-19 NOTE — Telephone Encounter (Signed)
 STAT call came through triage for pt having CP. Pt was seen at another provider's office this morning where she was having CP and they advised pt to go to ED. They wanted to call EMS for pt but pt refused because she stated she needed to take her car home and needed to take her husband home because he can't be alone. Pt stated she has tried calling family and can't get ahold of anyone and if she still can't, she won't go to ED. Educated pt that it is very important to be evaluated if she is having active CP. Pt began to get very upset and started to yell into the phone that she will not be leaving her husband and she will just stay at home like she normally does. Advised again to pt the importance of calling 911 if CP continues and/ or worsens, pt continued to state she would not be calling 911. Pt asked for us  to send a message to Dr. Barbaraann to review information. Message will be sent to Dr. Barbaraann and his nurse.

## 2023-12-20 ENCOUNTER — Inpatient Hospital Stay (HOSPITAL_COMMUNITY)

## 2023-12-20 ENCOUNTER — Other Ambulatory Visit: Payer: Self-pay

## 2023-12-20 DIAGNOSIS — R5383 Other fatigue: Secondary | ICD-10-CM

## 2023-12-20 DIAGNOSIS — D509 Iron deficiency anemia, unspecified: Secondary | ICD-10-CM

## 2023-12-20 DIAGNOSIS — R0602 Shortness of breath: Secondary | ICD-10-CM

## 2023-12-20 DIAGNOSIS — R933 Abnormal findings on diagnostic imaging of other parts of digestive tract: Secondary | ICD-10-CM | POA: Diagnosis not present

## 2023-12-20 DIAGNOSIS — R7989 Other specified abnormal findings of blood chemistry: Secondary | ICD-10-CM | POA: Diagnosis not present

## 2023-12-20 DIAGNOSIS — Z7901 Long term (current) use of anticoagulants: Secondary | ICD-10-CM

## 2023-12-20 DIAGNOSIS — R42 Dizziness and giddiness: Secondary | ICD-10-CM | POA: Diagnosis not present

## 2023-12-20 LAB — COMPREHENSIVE METABOLIC PANEL WITH GFR
ALT: 23 U/L (ref 0–44)
AST: 27 U/L (ref 15–41)
Albumin: 3 g/dL — ABNORMAL LOW (ref 3.5–5.0)
Alkaline Phosphatase: 52 U/L (ref 38–126)
Anion gap: 9 (ref 5–15)
BUN: 25 mg/dL — ABNORMAL HIGH (ref 8–23)
CO2: 21 mmol/L — ABNORMAL LOW (ref 22–32)
Calcium: 8.5 mg/dL — ABNORMAL LOW (ref 8.9–10.3)
Chloride: 107 mmol/L (ref 98–111)
Creatinine, Ser: 1.82 mg/dL — ABNORMAL HIGH (ref 0.44–1.00)
GFR, Estimated: 28 mL/min — ABNORMAL LOW (ref >60)
Glucose, Bld: 166 mg/dL — ABNORMAL HIGH (ref 70–99)
Potassium: 3.4 mmol/L — ABNORMAL LOW (ref 3.5–5.1)
Sodium: 137 mmol/L (ref 135–145)
Total Bilirubin: 0.9 mg/dL (ref 0.0–1.2)
Total Protein: 6 g/dL — ABNORMAL LOW (ref 6.5–8.1)

## 2023-12-20 LAB — CBC
HCT: 24.6 % — ABNORMAL LOW (ref 36.0–46.0)
Hemoglobin: 7.7 g/dL — ABNORMAL LOW (ref 12.0–15.0)
MCH: 24.1 pg — ABNORMAL LOW (ref 26.0–34.0)
MCHC: 31.3 g/dL (ref 30.0–36.0)
MCV: 76.9 fL — ABNORMAL LOW (ref 80.0–100.0)
Platelets: 249 K/uL (ref 150–400)
RBC: 3.2 MIL/uL — ABNORMAL LOW (ref 3.87–5.11)
RDW: 16.7 % — ABNORMAL HIGH (ref 11.5–15.5)
WBC: 6.3 K/uL (ref 4.0–10.5)
nRBC: 0 % (ref 0.0–0.2)

## 2023-12-20 LAB — ECHOCARDIOGRAM COMPLETE
Height: 65 in
MV VTI: 0.73 cm2
S' Lateral: 2.7 cm
Weight: 3760 [oz_av]

## 2023-12-20 LAB — RETICULOCYTES
Immature Retic Fract: 15.7 % (ref 2.3–15.9)
RBC.: 3.21 MIL/uL — ABNORMAL LOW (ref 3.87–5.11)
Retic Count, Absolute: 32.7 K/uL (ref 19.0–186.0)
Retic Ct Pct: 1 % (ref 0.4–3.1)

## 2023-12-20 LAB — IRON AND TIBC
Iron: 30 ug/dL (ref 28–170)
Saturation Ratios: 8 % — ABNORMAL LOW (ref 10.4–31.8)
TIBC: 382 ug/dL (ref 250–450)
UIBC: 352 ug/dL

## 2023-12-20 LAB — GLUCOSE, CAPILLARY
Glucose-Capillary: 131 mg/dL — ABNORMAL HIGH (ref 70–99)
Glucose-Capillary: 180 mg/dL — ABNORMAL HIGH (ref 70–99)
Glucose-Capillary: 138 mg/dL — ABNORMAL HIGH (ref 70–99)

## 2023-12-20 LAB — FERRITIN: Ferritin: 16 ng/mL (ref 11–307)

## 2023-12-20 LAB — HEMOGLOBIN A1C
Hgb A1c MFr Bld: 6.8 % — ABNORMAL HIGH (ref 4.8–5.6)
Mean Plasma Glucose: 148.46 mg/dL

## 2023-12-20 LAB — C-REACTIVE PROTEIN: CRP: 0.5 mg/dL (ref <1.0)

## 2023-12-20 LAB — VITAMIN B12: Vitamin B-12: 2005 pg/mL — ABNORMAL HIGH (ref 180–914)

## 2023-12-20 LAB — FOLATE: Folate: >20 ng/mL (ref >5.9)

## 2023-12-20 LAB — TROPONIN I (HIGH SENSITIVITY): Troponin I (High Sensitivity): 17 ng/L (ref <18)

## 2023-12-20 LAB — SEDIMENTATION RATE: Sed Rate: 20 mm/h (ref 0–22)

## 2023-12-20 MED ORDER — ALUM & MAG HYDROXIDE-SIMETH 200-200-20 MG/5ML PO SUSP
30.0000 mL | Freq: Four times a day (QID) | ORAL | Status: DC | PRN
  Administered 2023-12-20: 30 mL via ORAL
  Filled 2023-12-20: qty 30

## 2023-12-20 MED ORDER — VALSARTAN-HYDROCHLOROTHIAZIDE 160-25 MG PO TABS
1.0000 | ORAL_TABLET | Freq: Every day | ORAL | Status: DC
Start: 2023-12-21 — End: 2023-12-20

## 2023-12-20 MED ORDER — IRBESARTAN 300 MG PO TABS
150.0000 mg | ORAL_TABLET | Freq: Every day | ORAL | Status: DC
  Administered 2023-12-21 – 2023-12-22 (×2): 150 mg via ORAL
  Filled 2023-12-20 (×2): qty 1

## 2023-12-20 MED ORDER — HYDROCHLOROTHIAZIDE 25 MG PO TABS
25.0000 mg | ORAL_TABLET | Freq: Every day | ORAL | Status: DC
  Administered 2023-12-21 – 2023-12-22 (×2): 25 mg via ORAL
  Filled 2023-12-20 (×2): qty 1

## 2023-12-20 MED ORDER — NA FERRIC GLUC CPLX IN SUCROSE 12.5 MG/ML IV SOLN
250.0000 mg | Freq: Every day | INTRAVENOUS | Status: AC
  Administered 2023-12-20: 250 mg via INTRAVENOUS
  Filled 2023-12-20: qty 20

## 2023-12-20 MED ORDER — INSULIN ASPART 100 UNIT/ML IJ SOLN
0.0000 [IU] | Freq: Three times a day (TID) | INTRAMUSCULAR | Status: DC | PRN
  Administered 2023-12-21: 1 [IU] via SUBCUTANEOUS

## 2023-12-20 MED ORDER — METOCLOPRAMIDE HCL 5 MG/ML IJ SOLN
10.0000 mg | Freq: Three times a day (TID) | INTRAMUSCULAR | Status: DC | PRN
  Administered 2023-12-20 – 2023-12-24 (×2): 10 mg via INTRAVENOUS
  Filled 2023-12-20 (×2): qty 2

## 2023-12-20 MED ORDER — SODIUM CHLORIDE 0.9 % IV SOLN
125.0000 mg | Freq: Once | INTRAVENOUS | Status: DC
  Filled 2023-12-20: qty 10

## 2023-12-20 MED ORDER — SODIUM CHLORIDE 0.9 % IV SOLN: 250.0000 mg | Freq: Every day | INTRAVENOUS | Status: DC

## 2023-12-20 MED ORDER — POTASSIUM CHLORIDE CRYS ER 20 MEQ PO TBCR
20.0000 meq | EXTENDED_RELEASE_TABLET | Freq: Once | ORAL | Status: AC
  Administered 2023-12-20: 20 meq via ORAL
  Filled 2023-12-20: qty 1

## 2023-12-20 MED ORDER — CARVEDILOL 25 MG PO TABS
25.0000 mg | ORAL_TABLET | Freq: Two times a day (BID) | ORAL | Status: DC
  Administered 2023-12-20 – 2023-12-25 (×8): 25 mg via ORAL
  Filled 2023-12-20 (×9): qty 1

## 2023-12-20 NOTE — ED Notes (Signed)
 Floor notified patient coming up

## 2023-12-20 NOTE — Progress Notes (Signed)
 Progress Note   Patient: Destiny Rice FMW:[id number] DOB: Aug 08, 1945 DOA: 12/19/2023     1 DOS: the patient was seen and examined on 12/20/2023   Brief hospital course: Destiny Rice is a 78 y.o. female with medical history significant of aortic stenosis status post TAVR, paroxysmal atrial fibrillation, CAD, CKD 3B, hyperlipidemia, non-insulin -dependent DM type II, primary osteoarthritis of multiple joint, peripheral artery disease and hypothyroidism presented emergency department complaining of shortness of breath, exertional dyspnea, generalized weakness and feeling sick in stomach after receiving flu vaccine on 12/11/2023.  She was found to have hemoglobin of 8 dropped from baseline 10, elevated BNP, mild worsening of kidney function from baseline.  EKG showed ST depressions lead to 3 aVF.  CT abdomen pelvis showed interval development of lobulated hypodense mass in left hepatic dome, cholelithiasis. Patient is admitted to the hospitalist service with impression of symptomatic anemia, elevated BNP, new EKG changes, AKI on CKD stage IIIb.  Assessment and Plan: Symptomatic anemia Acute on chronic anemia- Presented with shortness of breath after receiving vaccine on Monday. Reviewed labs from Care Everywhere, hemoglobin 10/14/2023 is 8.5. Not significant drop from 2 months ago.  She is aware of her iron deficiency. No active bleeding to hold Eliquis .  Evaluated by GI, she is not interested in pursuing aggressive workup for iron deficiency. She is [employer]'s [employer] and does not want to take blood transfusion. Iron levels low, IV iron transfusion given.  Acute on CKD stage III B: Baseline creatinine around 1.6-1.8. Patient presented with creatinine of 2.0. Patient's kidney function improved today. Avoid nephrotoxic drugs.  Hold valsartan , hydrochlorothiazide .  Hypodense hepatic lesion- Seen by GI recommended MRI to evaluate further. Further recommendations per MRI results.  Essential  hypertension- Blood pressure elevated.  Continue hydralazine , Imdur . Resume ARB, HCTZ from tomorrow. Resumed Coreg .  Type 2 diabetes mellitus: Continue Accu-Cheks, sliding scale insulin .  Paroxysmal Afib- Continue Eliquis , resumed Coreg .  History of mitral valve repair CAD PAD Hyperlipidemia Continue Eliquis , Lipitor, beta-blocker therapy.  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       Out of bed to chair. Incentive spirometry. Nursing supportive care. Fall, aspiration precautions. Diet:  Diet Orders (From admission, onward)     Start     Ordered   12/20/23 0854  Diet Heart Room service appropriate? Yes; Fluid consistency: Thin  Diet effective now       Question Answer Comment  Room service appropriate? Yes   Fluid consistency: Thin      12/20/23 0853           DVT prophylaxis: SCDs Start: 12/19/23 2341 Place TED hose Start: 12/19/23 2341 apixaban  (ELIQUIS ) tablet 5 mg  Level of care: Progressive   Code Status: Full Code  Subjective: Patient is seen and examined today morning in the ED.  Lying in bed, tolerating IV iron. She denies any chest pain.  Asks if she can regular diet.  Echo technician at bedside.  Physical Exam: Vitals:   12/20/23 0705 12/20/23 0947 12/20/23 1100 12/20/23 1201  BP:  (!) 193/70 (!) 209/79 (!) 182/75  Pulse:  62 61 62  Resp:  20 20 10   Temp: 98 F (36.7 C)  97.8 F (36.6 C) 97.6 F (36.4 C)  TempSrc: Oral  Oral Oral  SpO2:  98% 95% 96%  Weight:      Height:        General - Elderly obese African American female, no apparent distress HEENT - PERRLA, EOMI, atraumatic  head, non tender sinuses. Lung - distant breath sounds, basal rales, rhonchi, no wheezes. Heart - S1, S2 heard, no murmurs, rubs, 1+ pedal edema. Abdomen - Soft, non tender, obese, bowel sounds good Neuro - Alert, awake and oriented x 3, non focal exam. Skin - Warm and dry.  Data Reviewed:      Latest Ref Rng & Units  12/20/2023    3:27 AM 12/19/2023    4:12 PM 11/15/2022    9:00 AM  CBC  WBC 4.0 - 10.5 K/uL 6.3  6.4  7.1   Hemoglobin 12.0 - 15.0 g/dL 7.7  8.0  89.1   Hematocrit 36.0 - 46.0 % 24.6  27.1  34.3   Platelets 150 - 400 K/uL 249  290  285       Latest Ref Rng & Units 12/20/2023    3:27 AM 12/19/2023    4:12 PM 11/15/2022    9:00 AM  BMP  Glucose 70 - 99 mg/dL 833  878  863   BUN 8 - 23 mg/dL 25  27  20    Creatinine 0.44 - 1.00 mg/dL 8.17  7.99  8.39   BUN/Creat Ratio 12 - 28   13   Sodium 135 - 145 mmol/L 137  137  140   Potassium 3.5 - 5.1 mmol/L 3.4  4.1  4.1   Chloride 98 - 111 mmol/L 107  103  101   CO2 22 - 32 mmol/L 21  22  23    Calcium  8.9 - 10.3 mg/dL 8.5  8.6  9.1    ECHOCARDIOGRAM COMPLETE Result Date: 12/20/2023    ECHOCARDIOGRAM REPORT   Patient Name:   Destiny Rice Date of Exam: 12/20/2023 Medical Rec #:  [id number]      Height:       65.0 in Accession #:    [id number]     Weight:       235.0 lb Date of Birth:  [birth year]      BSA:          2.118 m Patient Age:    78 years       BP:           144/60 mmHg Patient Gender: F              HR:           63 bpm. Exam Location:  Inpatient Procedure: 2D Echo, Cardiac Doppler and Color Doppler (Both Spectral and Color            Flow Doppler were utilized during procedure). Indications:    Elevated brain natriuretic peptide (BNP) level [[id number]]  History:        Patient has prior history of Echocardiogram examinations, most                 recent 04/23/2022. CAD; Risk Factors:Hypertension, Diabetes and                 Dyslipidemia.                 Aortic Valve: 26 mm CoreValve-Evolut Pro prosthetic, stented                 (TAVR) valve is present in the aortic position. Procedure Date:                 04/24/2018.                 Mitral Valve: prosthetic annuloplasty ring valve is present  in                 the mitral position. Procedure Date: 12/09/2012.  Sonographer:    Philomena Daring Referring Phys: [id number] SUBRINA SUNDIL IMPRESSIONS  1. Left  ventricular ejection fraction, by estimation, is 60 to 65%. The left ventricle has normal function. The left ventricle has no regional wall motion abnormalities. There is mild left ventricular hypertrophy. Left ventricular diastolic parameters are indeterminate. There is the interventricular septum is flattened in systole and diastole, consistent with right ventricular pressure and volume overload.  2. Right ventricular systolic function is normal. The right ventricular size is normal. There is normal pulmonary artery systolic pressure. The estimated right ventricular systolic pressure is 30.1 mmHg.  3. The mitral valve has been repaired/replaced. No evidence of mitral valve regurgitation. 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 The mean mitral valve gradient is 12.0 mmHg. There is a prosthetic annuloplasty ring present in the mitral position. Procedure  Date: 12/09/2012.  4. The aortic valve has been repaired/replaced. Aortic valve regurgitation is not visualized. No aortic stenosis is present. There is a 26 mm CoreValve-Evolut Pro prosthetic (TAVR) valve present in the aortic position. Procedure Date: 04/24/2018.  5. The inferior vena cava is dilated in size with <50% respiratory variability, suggesting right atrial pressure of 15 mmHg. FINDINGS  Left Ventricle: Left ventricular ejection fraction, by estimation, is 60 to 65%. The left ventricle has normal function. The left ventricle has no regional wall motion abnormalities. The left ventricular internal cavity size was normal in size. There is  mild left ventricular hypertrophy. The interventricular septum is flattened in systole and diastole, consistent with right ventricular pressure and volume overload. Left ventricular diastolic parameters are indeterminate. Right Ventricle: The right ventricular size is normal. No increase in right ventricular wall thickness. Right ventricular systolic function is normal. There is normal pulmonary artery systolic pressure. The  tricuspid regurgitant velocity is 1.94 m/s, and  with an assumed right atrial pressure of 15 mmHg, the estimated right ventricular systolic pressure is 30.1 mmHg. Left Atrium: Left atrial size was normal in size. Right Atrium: Right atrial size was normal in size. Pericardium: There is no evidence of pericardial effusion. Mitral Valve: The mitral valve has been repaired/replaced. No evidence of mitral valve regurgitation. There is a prosthetic annuloplasty ring present in the mitral position. Procedure Date: 12/09/2012. Moderate to severe mitral valve stenosis. MV peak gradient, 26.4 mmHg. The mean mitral valve gradient is 12.0 mmHg. Tricuspid Valve: The tricuspid valve is normal in structure. Tricuspid valve regurgitation is mild . No evidence of tricuspid stenosis. Aortic Valve: The aortic valve has been repaired/replaced. Aortic valve regurgitation is not visualized. No aortic stenosis is present. There is a 26 mm CoreValve-Evolut Pro prosthetic, stented (TAVR) valve present in the aortic position. Procedure Date:  04/24/2018. Pulmonic Valve: The pulmonic valve was normal in structure. Pulmonic valve regurgitation is not visualized. No evidence of pulmonic stenosis. Aorta: The aortic root is normal in size and structure. Venous: The inferior vena cava is dilated in size with less than 50% respiratory variability, suggesting right atrial pressure of 15 mmHg. IAS/Shunts: No atrial level shunt detected by color flow Doppler.  LEFT VENTRICLE PLAX 2D LVIDd:         4.20 cm   Diastology LVIDs:         2.70 cm   LV e' medial:  5.87 cm/s LV PW:         1.20 cm   LV e' lateral: 5.98  cm/s LV IVS:        1.30 cm LVOT diam:     1.70 cm LV SV:         51 LV SV Index:   24 LVOT Area:     2.27 cm  RIGHT VENTRICLE            IVC RV S prime:     8.16 cm/s  IVC diam: 2.40 cm TAPSE (M-mode): 1.7 cm LEFT ATRIUM             Index        RIGHT ATRIUM           Index LA Vol (A2C):   60.2 ml 28.42 ml/m  RA Area:     15.40 cm LA Vol  (A4C):   56.0 ml 26.43 ml/m  RA Volume:   37.70 ml  17.80 ml/m LA Biplane Vol: 62.0 ml 29.27 ml/m  AORTIC VALVE LVOT Vmax:   90.20 cm/s LVOT Vmean:  61.100 cm/s LVOT VTI:    0.223 m  AORTA Ao Asc diam: 2.40 cm MITRAL VALVE              TRICUSPID VALVE MV Area VTI:  0.73 cm    TR Peak grad:   15.1 mmHg MV Peak grad: 26.4 mmHg   TR Vmax:        194.00 cm/s MV Mean grad: 12.0 mmHg MV Vmax:      2.57 m/s    SHUNTS MV Vmean:     167.0 cm/s  Systemic VTI:  0.22 m                           Systemic Diam: 1.70 cm Destiny Parchment MD Electronically signed by Destiny Parchment MD Signature Date/Time: 12/20/2023/10:37:58 AM    Final    CT ABDOMEN PELVIS WO CONTRAST Result Date: 12/19/2023 EXAM: CT ABDOMEN AND PELVIS WITHOUT CONTRAST 12/19/2023 07:53:00 PM TECHNIQUE: CT of the abdomen and pelvis was performed without the administration of intravenous contrast. Multiplanar reformatted images are provided for review. Automated exposure control, iterative reconstruction, and/or weight-based adjustment of the mA/kV was utilized to reduce the radiation dose to as low as reasonably achievable. COMPARISON: Comparison is made to March 10, 2020. CLINICAL HISTORY: Abdominal pain, acute, nonlocalized; ABD PAIN, low hgb, ckd. Chief complaints; Shortness of Breath; CT ABDOMEN PELVIS WO CONTRAST; Abdominal pain, acute, nonlocalized, ABD PAIN, low hgb, ckd. FINDINGS: LOWER CHEST: Hypoattenuation of the cardiac blood pool in keeping with at least moderate anemia. Moderate hiatal hernia. LIVER: Interval development of a lobulated hypodense mass within the left hepatic dome measuring 5.3 x 2.5 is not mentioned but a 5.3 x 3.1 cm mass at image 10/3 and a second rounded hypodense lesion within segment 7 measuring 2.3 cm at image 17/3. These are indeterminate on this noncontrast examination. GALLBLADDER AND BILE DUCTS: Cholelithiasis without superimposed pericholecystic inflammatory change. No intra or extrahepatic biliary ductal dilation. SPLEEN: No  acute abnormality. PANCREAS: No acute abnormality. ADRENAL GLANDS: No acute abnormality. KIDNEYS, URETERS AND BLADDER: Simple cortical cysts are seen within the kidneys bilaterally for which no follow-up imaging is recommended. Vascular calcifications are noted within the renal hila bilaterally. No ureteral calculi. No hydronephrosis. No perinephric inflammatory stranding or fluid collections were identified. The bladder is unremarkable. GI AND BOWEL: Colonic diverticulosis without superimposed acute inflammatory change. The stomach, small bowel, and large bowel are otherwise unremarkable. Appendix is normal. PERITONEUM AND RETROPERITONEUM: No ascites. No free  air. VASCULATURE: Extensive aortoiliac atherosclerotic calcification. Particularly prominent atherosclerotic calcifications at the origin of the mesenteric vasculature. LYMPH NODES: No lymphadenopathy. REPRODUCTIVE ORGANS: Status post hysterectomy. No adnexal mass. BONES AND SOFT TISSUES: Osseous structures are age appropriate. No acute bone abnormality. No lytic or blastic bone lesion. IMPRESSION: 1. Interval development of a lobulated hypodense mass within the left hepatic dome measuring 5.3 x 3.1 cm and a second rounded hypodense lesion within segment 7 measuring 2.3 cm, indeterminate on this noncontrast examination. 2. Cholelithiasis without superimposed pericholecystic inflammatory change. No intra or extrahepatic biliary ductal dilation. 3. Colonic diverticulosis without superimposed acute inflammatory change. 4. Moderate hiatal hernia. 5. Peripheral vascular disease. Particularly prominent atherosclerotic calcifications at the origin of the mesenteric vasculature. If there is clinical evidence of acute or chronic mesenteric ischemia, CT arteriography may be more helpful for further evaluation. Electronically signed by: Dorethia Molt MD 12/19/2023 08:05 PM EDT RP Workstation: [id number]   DG Chest 2 View Result Date: 12/19/2023 CLINICAL DATA:   Shortness of breath. EXAM: CHEST - 2 VIEW COMPARISON:  11/22/2022 FINDINGS: Borderline cardiomegaly. No pulmonary vascular congestion. Postsurgical changes of valve replacement are seen. Lungs are clear. IMPRESSION: Borderline cardiomegaly. Electronically Signed   By: Aliene Lloyd M.D.   On: 12/19/2023 17:05    Family Communication: Discussed with patient, understand and agree. All questions answered.  Disposition: Status is: Inpatient Remains inpatient appropriate because: MRI abdomen, anemia work up  Planned Discharge Destination: Home with Home Health     Time spent: 46 minutes  Author: Concepcion Riser, MD 12/20/2023 12:46 PM Secure chat 7am to 7pm For on call review [email or web address].

## 2023-12-20 NOTE — Plan of Care (Addendum)
 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  Informed patient that we have to first follow-up with the ferritin level or iron level in order to decide if patient needs iron transfusion or not.   Have a long conversation with patient at the bedside.  Even though I have tried to explain patient multiple times that I have to wait to check the iron/ferritin level in order to give iron transfusion patient insisting that her kidney doctor has been checked her iron level which is low and I am not trusting her and she wants to leave AMA and want to die at home instead of the hospital.  I have tried to explain patient multiple times however it remained unsuccessful.  Given CBC showing low MCV 76.9 initially ordered anemia panel which is still pending. -Due to low MCV with associated anemia giving ferric gluconate transfusion 250 milligram once.  Need to follow-up with ferritin and iron level.   Reproducible chest wall pain/musculoskeletal chest pain  Also patient is complaining about left-sided chest pain which is reproducible on physical exam.  EKG is unchanged as compared to previous EKG at 11 PM which showing ST depression in lead II, III and aVF and T wave inversion in lateral leads.  Initial troponin unremarkable however elevated BNP and discussed with cardiology at 11 PM recommended to obtain echocardiogram to assess for any wall motion abnormality. Repeat EKG unchanged.  Will follow-up with second sets of troponin. Update, troponin 15, 17, and 17.  Troponin within normal range.   Ruled out ACS.   Corvette Orser, MD [facility] 12/20/2023, 5:45 AM

## 2023-12-20 NOTE — ED Provider Notes (Signed)
 EKG Interpretation Date/Time:  Friday December 20 2023 05:08:07 EDT Ventricular Rate:  62 PR Interval:  292 QRS Duration:  115 QT Interval:  535 QTC Calculation: 544 R Axis:   96  Text Interpretation: Sinus or ectopic atrial rhythm Prolonged PR interval Consider left ventricular hypertrophy No significant change since last tracing Confirmed by Midge Golas ([id number]) on 12/20/2023 5:30:18 AM       I was asked to evaluate the patient because she reported chest pain When I walked in the room she is in no acute distress requesting to go home Repeat EKG is unchanged.  Patient seen in conjunction with Dr. Sundil at the bedside Vitals appropriate   Midge Golas, MD 12/20/23 [phone]

## 2023-12-20 NOTE — ED Provider Notes (Signed)
 [place] EMERGENCY DEPARTMENT AT [facility] Provider Note   CSN: [id number] Arrival date & time: 12/19/23  1453     Patient presents with: Shortness of Breath   Destiny Rice is a 78 y.o. female.  With a history of atrial fibrillation on Eliquis , CKD, type 2 diabetes, aortic stenosis status post TAVR who presents to the ED for generalized weakness and dyspnea.  Patient reports feeling unwell over the last 2 weeks.  She feels generalized weakness and has had more shortness of breath when walking around than usual.  No chest pain nausea vomiting fevers or chills.  She had outpatient labs drawn which showed a significant drop in her hemoglobin from baseline.  Considering she is on Eliquis  there was concern for potential bleeding.  Patient has not had any GI bleeding falls or other trauma.  No other sources of bleeding.    Shortness of Breath      Prior to Admission medications   Medication Sig Start Date End Date Taking? Authorizing Provider  ALPRAZolam  (XANAX ) 0.25 MG tablet Take 0.25 mg by mouth at bedtime as needed for anxiety.    [provider]  amiodarone  (PACERONE ) 200 MG tablet TAKE 1 TABLET BY MOUTH DAILY 04/30/23   Darron Deatrice LABOR, MD  apixaban  (ELIQUIS ) 5 MG TABS tablet Take 1 tablet (5 mg total) by mouth 2 (two) times daily. 01/01/23   O'NealDarryle Ned, MD  atorvastatin  (LIPITOR) 40 MG tablet Take 1 tablet (40 mg total) by mouth daily. 01/24/23   O'NealDarryle Ned, MD  Calcium  Carb-Cholecalciferol (CALCIUM  600 + D PO) Take 1 tablet by mouth daily.    [provider]  carvedilol  (COREG ) 25 MG tablet Take 1 tablet (25 mg total) by mouth 2 (two) times daily with a meal. 05/29/22   Wendolyn Jenkins Jansky, MD  cholecalciferol (VITAMIN D3) 25 MCG (1000 UNIT) tablet Take 1,000 Units by mouth daily.    [provider]  Cyanocobalamin  (VITAMIN B-12) 5000 MCG TBDP Take 5,000 mcg by mouth daily.    [provider]  ELIQUIS  5 MG TABS tablet  TAKE 1 TABLET(5 MG) BY MOUTH TWICE DAILY 07/26/23   [name], Darryle Ned, MD  Ferrous Sulfate  (IRON PO) Take 2.5 mLs by mouth in the morning and at bedtime. Liquid Iron    [provider]  fluticasone (FLONASE) 50 MCG/ACT nasal spray Place 1 spray into both nostrils daily as needed for allergies or rhinitis.    [provider]  folic acid  (FOLVITE ) 400 MCG tablet Take 400 mcg by mouth daily.    [provider]  gabapentin  (NEURONTIN ) 100 MG capsule Take 1 capsule (100 mg total) by mouth 3 (three) times daily. 12/10/23   Darron Deatrice LABOR, MD  hydrALAZINE  (APRESOLINE ) 50 MG tablet TAKE 1 TABLET BY MOUTH TWICE DAILY 10/16/23   [name], Darryle Ned, MD  isosorbide  mononitrate (IMDUR ) 30 MG 24 hr tablet Take 1 tablet (30 mg total) by mouth daily. 05/10/23 08/08/23  O'NealDarryle Ned, MD  levothyroxine  (SYNTHROID ) 25 MCG tablet Take 1 tablet (25 mcg total) by mouth daily before breakfast. 05/29/22   Wendolyn Jenkins Jansky, MD  meclizine  (ANTIVERT ) 12.5 MG tablet Take 1 tablet (12.5 mg total) by mouth 3 (three) times daily as needed for dizziness. 01/29/22   Wendolyn Jenkins Jansky, MD  nitroGLYCERIN  (NITROSTAT ) 0.4 MG SL tablet Place 1 tablet (0.4 mg total) under the tongue every 5 (five) minutes as needed for chest pain. 05/29/22   O'NealDarryle Ned,  MD  pantoprazole  (PROTONIX ) 40 MG tablet Take 1 tablet (40 mg total) by mouth 2 (two) times daily before a meal. 06/28/23   [name], Darryle Ned, MD  potassium chloride  SA (KLOR-CON  M20) 20 MEQ tablet Take 1 tablet (20 mEq total) by mouth daily. 01/29/22   Wendolyn Jenkins Jansky, MD  valsartan -hydrochlorothiazide  (DIOVAN -HCT) 160-25 MG tablet Take 1 tablet by mouth daily. 07/01/23   O'NealDarryle Ned, MD    Allergies: Oseltamivir, Dapagliflozin, Iodine -131, Metformin hcl, and Sulfa antibiotics    Review of Systems  Respiratory:  Positive for shortness of breath.     Updated Vital Signs BP (!) 199/66 (BP Location: Right Arm)   Pulse 62   Temp  97.9 F (36.6 C) (Oral)   Resp 18   Ht 5' 5 (1.651 m)   Wt 106.6 kg   SpO2 98%   BMI 39.11 kg/m   Physical Exam Vitals and nursing note reviewed.  HENT:     Head: Normocephalic and atraumatic.  Eyes:     Pupils: Pupils are equal, round, and reactive to light.  Cardiovascular:     Rate and Rhythm: Normal rate and regular rhythm.  Pulmonary:     Effort: Pulmonary effort is normal.     Breath sounds: Normal breath sounds.  Abdominal:     Palpations: Abdomen is soft.     Tenderness: There is no abdominal tenderness.  Genitourinary:    Rectum: Guaiac result negative.  Skin:    General: Skin is warm and dry.  Neurological:     General: No focal deficit present.     Mental Status: She is alert.  Psychiatric:        Mood and Affect: Mood normal.     (all labs ordered are listed, but only abnormal results are displayed) Labs Reviewed  BRAIN NATRIURETIC PEPTIDE - Abnormal; Notable for the following components:      Result Value   B Natriuretic Peptide 697.1 (*)    All other components within normal limits  COMPREHENSIVE METABOLIC PANEL WITH GFR - Abnormal; Notable for the following components:   Glucose, Bld 121 (*)    BUN 27 (*)    Creatinine, Ser 2.00 (*)    Calcium  8.6 (*)    Albumin 3.3 (*)    GFR, Estimated 25 (*)    All other components within normal limits  CBC WITH DIFFERENTIAL/PLATELET - Abnormal; Notable for the following components:   RBC 3.46 (*)    Hemoglobin 8.0 (*)    HCT 27.1 (*)    MCV 78.3 (*)    MCH 23.1 (*)    MCHC 29.5 (*)    RDW 16.9 (*)    All other components within normal limits  OCCULT BLOOD X 1 CARD TO LAB, STOOL  SEDIMENTATION RATE  C-REACTIVE PROTEIN  COMPREHENSIVE METABOLIC PANEL WITH GFR  CBC  PROTIME-INR  APTT  VITAMIN B12  FOLATE  IRON AND TIBC  FERRITIN  RETICULOCYTES  TROPONIN I (HIGH SENSITIVITY)  TROPONIN I (HIGH SENSITIVITY)    EKG: None  Radiology: CT ABDOMEN PELVIS WO CONTRAST Result Date: 12/19/2023 EXAM:  CT ABDOMEN AND PELVIS WITHOUT CONTRAST 12/19/2023 07:53:00 PM TECHNIQUE: CT of the abdomen and pelvis was performed without the administration of intravenous contrast. Multiplanar reformatted images are provided for review. Automated exposure control, iterative reconstruction, and/or weight-based adjustment of the mA/kV was utilized to reduce the radiation dose to as low as reasonably achievable. COMPARISON: Comparison is made to March 10, 2020. CLINICAL HISTORY: Abdominal pain, acute,  nonlocalized; ABD PAIN, low hgb, ckd. Chief complaints; Shortness of Breath; CT ABDOMEN PELVIS WO CONTRAST; Abdominal pain, acute, nonlocalized, ABD PAIN, low hgb, ckd. FINDINGS: LOWER CHEST: Hypoattenuation of the cardiac blood pool in keeping with at least moderate anemia. Moderate hiatal hernia. LIVER: Interval development of a lobulated hypodense mass within the left hepatic dome measuring 5.3 x 2.5 is not mentioned but a 5.3 x 3.1 cm mass at image 10/3 and a second rounded hypodense lesion within segment 7 measuring 2.3 cm at image 17/3. These are indeterminate on this noncontrast examination. GALLBLADDER AND BILE DUCTS: Cholelithiasis without superimposed pericholecystic inflammatory change. No intra or extrahepatic biliary ductal dilation. SPLEEN: No acute abnormality. PANCREAS: No acute abnormality. ADRENAL GLANDS: No acute abnormality. KIDNEYS, URETERS AND BLADDER: Simple cortical cysts are seen within the kidneys bilaterally for which no follow-up imaging is recommended. Vascular calcifications are noted within the renal hila bilaterally. No ureteral calculi. No hydronephrosis. No perinephric inflammatory stranding or fluid collections were identified. The bladder is unremarkable. GI AND BOWEL: Colonic diverticulosis without superimposed acute inflammatory change. The stomach, small bowel, and large bowel are otherwise unremarkable. Appendix is normal. PERITONEUM AND RETROPERITONEUM: No ascites. No free air. VASCULATURE:  Extensive aortoiliac atherosclerotic calcification. Particularly prominent atherosclerotic calcifications at the origin of the mesenteric vasculature. LYMPH NODES: No lymphadenopathy. REPRODUCTIVE ORGANS: Status post hysterectomy. No adnexal mass. BONES AND SOFT TISSUES: Osseous structures are age appropriate. No acute bone abnormality. No lytic or blastic bone lesion. IMPRESSION: 1. Interval development of a lobulated hypodense mass within the left hepatic dome measuring 5.3 x 3.1 cm and a second rounded hypodense lesion within segment 7 measuring 2.3 cm, indeterminate on this noncontrast examination. 2. Cholelithiasis without superimposed pericholecystic inflammatory change. No intra or extrahepatic biliary ductal dilation. 3. Colonic diverticulosis without superimposed acute inflammatory change. 4. Moderate hiatal hernia. 5. Peripheral vascular disease. Particularly prominent atherosclerotic calcifications at the origin of the mesenteric vasculature. If there is clinical evidence of acute or chronic mesenteric ischemia, CT arteriography may be more helpful for further evaluation. Electronically signed by: Dorethia Molt MD 12/19/2023 08:05 PM EDT RP Workstation: [id number]   DG Chest 2 View Result Date: 12/19/2023 CLINICAL DATA:  Shortness of breath. EXAM: CHEST - 2 VIEW COMPARISON:  11/22/2022 FINDINGS: Borderline cardiomegaly. No pulmonary vascular congestion. Postsurgical changes of valve replacement are seen. Lungs are clear. IMPRESSION: Borderline cardiomegaly. Electronically Signed   By: Aliene Lloyd M.D.   On: 12/19/2023 17:05     Procedures   Medications Ordered in the ED  amiodarone  (PACERONE ) tablet 200 mg (has no administration in time range)  atorvastatin  (LIPITOR) tablet 20 mg (has no administration in time range)  carvedilol  (COREG ) tablet 25 mg (has no administration in time range)  hydrALAZINE  (APRESOLINE ) tablet 50 mg (50 mg Oral Given 12/20/23 0010)  ALPRAZolam  (XANAX ) tablet 0.25 mg  (has no administration in time range)  levothyroxine  (SYNTHROID ) tablet 25 mcg (has no administration in time range)  meclizine  (ANTIVERT ) tablet 12.5 mg (has no administration in time range)  pantoprazole  (PROTONIX ) EC tablet 40 mg (has no administration in time range)  apixaban  (ELIQUIS ) tablet 5 mg (5 mg Oral Given 12/20/23 0009)  folic acid  (FOLVITE ) tablet 0.5 mg (has no administration in time range)  ferrous sulfate  tablet 325 mg (has no administration in time range)  gabapentin  (NEURONTIN ) capsule 100 mg (100 mg Oral Not Given 12/20/23 0009)  sodium chloride  flush (NS) 0.9 % injection 3 mL (3 mLs Intravenous Not Given 12/20/23 0010)  sodium chloride  flush (  NS) 0.9 % injection 3 mL (3 mLs Intravenous Not Given 12/20/23 0010)  sodium chloride  flush (NS) 0.9 % injection 3 mL (has no administration in time range)  0.9 %  sodium chloride  infusion (has no administration in time range)  acetaminophen  (TYLENOL ) tablet 650 mg (has no administration in time range)    Or  acetaminophen  (TYLENOL ) suppository 650 mg (has no administration in time range)  isosorbide  mononitrate (IMDUR ) 24 hr tablet 30 mg (30 mg Oral Given 12/20/23 0011)  hydrALAZINE  (APRESOLINE ) injection 10 mg (has no administration in time range)                                    Medical Decision Making 78 year old female with history as above presented to the ED for dyspnea on exertion and generalized weakness.  Hemoglobin significantly decreased to 8.0 down from baseline.  Hypertensive awake alert.  No history consistent with bleeding.  No evidence of GI bleeding.  Hemoccult stool negative.  Of note, patient is [employer]'s Witness and would not accept blood transfusions if indicated.  CT abdomen pelvis shows no acute evidence of bleeding.  We did see a hypodense hepatic lesion of the left hepatic lobe that appears new from previous studies.  It is uncertain what this represents at this time but would likely merit additional workup.  I  reviewed the findings of the CT with the patient and her family over the bedside.  Also unclear cause of downtrending hemoglobin but given that she is symptomatic certainly warrants admission.  Discussed with admitting hospitalist accepts patient for admission  Amount and/or Complexity of Data Reviewed Labs: ordered. Radiology: ordered.  Risk Decision regarding hospitalization.        Final diagnoses:  Dyspnea, unspecified type  Anemia, unspecified type  Liver lesion    ED Discharge Orders     None          Pamella Ozell LABOR, DO 12/20/23 0124

## 2023-12-20 NOTE — ED Notes (Signed)
 CCMD called for monitoring

## 2023-12-20 NOTE — Plan of Care (Signed)

## 2023-12-20 NOTE — Consult Note (Signed)
 Consultation  Referring Provider: TRH/Sundil Primary Care Physician:  Reita Locus, MD Primary Gastroenterologist: Sampson  Reason for Consultation: Iron deficiency anemia, abnormal CT imaging of liver  HPI: Destiny Rice is a 78 y.o. female with history of atrial fibrillation for which she is on Eliquis , coronary artery disease status post multiple stents, chronic kidney disease stage IIIb, diabetes mellitus, and status post previous TAVR, hypertension and hypothyroidism.  She reports having surgery for a cardiac lipoma. Patient had undergone outpatient labs which returned yesterday and showed a hemoglobin in the 8 range and she was referred to the emergency room.  She had also been complaining of feeling poorly since she had had an influenza vaccine about a week ago.  She says she just felt sick after the injection had some nausea without vomiting.  This past weekend she also was feeling fatigued and somewhat lightheaded. On arrival to the ER hemoglobin 8/hematocrit 27/MCV 78.3/WBC 6.4/platelets 290 BNP 697 BUN 27/creatinine 2.0 Troponins negative LFTs within normal limits Ferritin 16/serum iron 30/TIBC 382/iron sat 8/B12 high. Stool Hemoccult negative  Underwent noncontrasted CT of the abdomen pelvis last evening that showed evidence of gallstones and a lobulated hypodense mass in the left hepatic dome measuring 5.3 x 2.5 cm, there is also a smaller mass measuring 2.3 cm, these are felt indeterminant.  Also noted to have moderate hiatal hernia and diverticulosis.  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 Reviewing her labs she has history of chronic anemia and hemoglobin was actually 8.5 in July 2025 and hemoglobin 10.8 in August 2024.  She says she has had iron deficiency anemia for many years and had been undergoing intermittent iron infusions while she was living in [place] .  She moved here in 2021 apparently  had 1 iron infusion after she moved and then with a second iron infusion she had some sort of a reaction and then did not go back and has not had any iron infusion since.  She has not been on oral iron at home. She has had previous serial colonoscopies every 10 years by her report and never had any polyps or abnormality.  She is not certain about previous EGDs. She has absolutely no GI symptoms, has not ever noticed any melena or hematochezia, no recent changes in bowel habits no complaints of abdominal pain, no heartburn or indigestion no dysphagia or odynophagia. Family history negative for GI malignancies as far she is aware.  Asked whether she would want to undergo further workup for the iron deficiency anemia she says why does not matter at this point I am almost 79, and I do not know if I will be here tomorrow   Patient continues on Eliquis .   Past Medical History:  Diagnosis Date   AF (paroxysmal atrial fibrillation) (HCC) 03/10/2020   Allergy    Chronic kidney disease, stage 3b (HCC) 03/10/2020   Coronary artery disease    10 stents -started 2014   Hypertension    Hypothyroidism 03/10/2020   Lipoma 2014   heart   Mixed hyperlipidemia due to type 2 diabetes mellitus (HCC) 03/10/2020   PONV (postoperative nausea and vomiting)    Type 2 diabetes mellitus with stage 3b chronic kidney disease, without long-term current use of insulin  (HCC) 03/10/2020    Past Surgical History:  Procedure Laterality Date   ABDOMINAL AORTOGRAM W/LOWER EXTREMITY N/A 08/22/2022   Procedure: ABDOMINAL AORTOGRAM W/LOWER EXTREMITY;  Surgeon: Darron Deatrice LABOR, MD;  Location: [facility];  Service: Cardiovascular;  Laterality: N/A;   ABDOMINAL HYSTERECTOMY     and bso   CAROTID ENDARTERECTOMY Left 2021   CORONARY ANGIOPLASTY WITH STENT PLACEMENT     x10   MITRAL VALVE REPAIR  12/09/2012   PERIPHERAL INTRAVASCULAR LITHOTRIPSY Right 08/22/2022   Procedure: PERIPHERAL INTRAVASCULAR LITHOTRIPSY;   Surgeon: Darron Deatrice LABOR, MD;  Location: [facility];  Service: Cardiovascular;  Laterality: Right;  R SFA, TP trunk and PT   PERIPHERAL VASCULAR BALLOON ANGIOPLASTY Right 08/22/2022   Procedure: PERIPHERAL VASCULAR BALLOON ANGIOPLASTY;  Surgeon: Darron Deatrice LABOR, MD;  Location: [facility];  Service: Cardiovascular;  Laterality: Right;  R SFA, TP trunk and PT   TRANSCATHETER AORTIC VALVE REPLACEMENT, 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    Prior to Admission medications   Medication Sig Start Date End Date Taking? Authorizing Provider  amiodarone  (PACERONE ) 200 MG tablet TAKE 1 TABLET BY MOUTH DAILY 04/30/23  Yes Darron Deatrice LABOR, MD  apixaban  (ELIQUIS ) 5 MG TABS tablet Take 1 tablet (5 mg total) by mouth 2 (two) times daily. 01/01/23  Yes [name], Darryle Ned, MD  atorvastatin  (LIPITOR) 40 MG tablet Take 1 tablet (40 mg total) by mouth daily. 01/24/23  Yes [name], Darryle Ned, MD  busPIRone (BUSPAR) 5 MG tablet Take 5 mg by mouth daily as needed (for anxiety). 09/18/23  Yes [provider]  CALCIUM  PO Take 1 tablet by mouth daily.   Yes [provider]  carvedilol  (COREG ) 25 MG tablet Take 1 tablet (25 mg total) by mouth 2 (two) times daily with a meal. 05/29/22  Yes Wendolyn Jenkins Jansky, MD  cholecalciferol (VITAMIN D3) 25 MCG (1000 UNIT) tablet Take 1,000 Units by mouth daily.   Yes [provider]  Cyanocobalamin  (VITAMIN B-12) 5000 MCG TBDP Take 5,000 mcg by mouth daily.   Yes [provider]  ELIQUIS  5 MG TABS tablet TAKE 1 TABLET(5 MG) BY MOUTH TWICE DAILY 07/26/23  Yes [name], Darryle Ned, MD  Ferrous Sulfate  (IRON PO) Take 2.5 mLs by mouth daily as needed (for energy). Liquid Iron   Yes [provider]  folic acid  (FOLVITE ) 400 MCG tablet Take 400 mcg by mouth daily.   Yes [provider]  hydrALAZINE  (APRESOLINE ) 50 MG tablet TAKE 1 TABLET BY MOUTH TWICE DAILY 10/16/23  Yes [name], Darryle Ned, MD  isosorbide  mononitrate (ISMO ) 20 MG  tablet Take 20 mg by mouth at bedtime.   Yes [provider]  levothyroxine  (SYNTHROID ) 25 MCG tablet Take 1 tablet (25 mcg total) by mouth daily before breakfast. 05/29/22  Yes Wendolyn Jenkins Jansky, MD  meclizine  (ANTIVERT ) 12.5 MG tablet Take 1 tablet (12.5 mg total) by mouth 3 (three) times daily as needed for dizziness. 01/29/22  Yes Wendolyn Jenkins Jansky, MD  pantoprazole  (PROTONIX ) 40 MG tablet Take 1 tablet (40 mg total) by mouth 2 (two) times daily before a meal. 06/28/23  Yes [name], Darryle Ned, MD  potassium chloride  SA (KLOR-CON  M20) 20 MEQ tablet Take 1 tablet (20 mEq total) by mouth daily. 01/29/22  Yes Wendolyn Jenkins Jansky, MD  valsartan -hydrochlorothiazide  (DIOVAN -HCT) 160-25 MG tablet Take 1 tablet by mouth daily. 07/01/23  Yes [name], Darryle Ned, MD  ALPRAZolam  (XANAX ) 0.25 MG tablet Take 0.25 mg by mouth at bedtime as needed for anxiety. Patient not taking: Reported on 12/20/2023    [provider]  gabapentin  (NEURONTIN ) 100 MG capsule Take 1 capsule (100 mg total) by mouth 3 (three) times daily.  Patient not taking: Reported on 12/20/2023 12/10/23   Darron Deatrice LABOR, MD  isosorbide  mononitrate (IMDUR ) 30 MG 24 hr tablet Take 1 tablet (30 mg total) by mouth daily. Patient not taking: Reported on 12/20/2023 05/10/23 12/19/24  Barbaraann Darryle Ned, MD  nitroGLYCERIN  (NITROSTAT ) 0.4 MG SL tablet Place 1 tablet (0.4 mg total) under the tongue every 5 (five) minutes as needed for chest pain. Patient not taking: Reported on 12/20/2023 05/29/22   Barbaraann Darryle Ned, MD    Current Facility-Administered Medications  Medication Dose Route Frequency Provider Last Rate Last Admin   0.9 %  sodium chloride  infusion  250 mL Intravenous PRN Sundil, Subrina, MD       acetaminophen  (TYLENOL ) tablet 650 mg  650 mg Oral Q6H PRN Sundil, Subrina, MD       Or   acetaminophen  (TYLENOL ) suppository 650 mg  650 mg Rectal Q6H PRN Sundil, Subrina, MD       ALPRAZolam  (XANAX ) tablet 0.25 mg  0.25 mg Oral  QHS PRN Sundil, Subrina, MD       alum & mag hydroxide-simeth (MAALOX/MYLANTA) 200-200-20 MG/5ML suspension 30 mL  30 mL Oral Q6H PRN Sundil, Subrina, MD   30 mL at 12/20/23 0156   amiodarone  (PACERONE ) tablet 200 mg  200 mg Oral Daily Sundil, Subrina, MD   200 mg at 12/20/23 0950   apixaban  (ELIQUIS ) tablet 5 mg  5 mg Oral BID Sundil, Subrina, MD   5 mg at 12/20/23 0950   atorvastatin  (LIPITOR) tablet 20 mg  20 mg Oral Daily Sundil, Subrina, MD   20 mg at 12/20/23 0950   ferrous sulfate  tablet 325 mg  325 mg Oral Q breakfast Sundil, Subrina, MD   325 mg at 12/20/23 0950   folic acid  (FOLVITE ) tablet 0.5 mg  500 mcg Oral Daily Sundil, Subrina, MD   0.5 mg at 12/20/23 9050   gabapentin  (NEURONTIN ) capsule 100 mg  100 mg Oral TID Sundil, Subrina, MD   100 mg at 12/20/23 0950   hydrALAZINE  (APRESOLINE ) injection 10 mg  10 mg Intravenous Q6H PRN Sundil, Subrina, MD   10 mg at 12/20/23 0426   hydrALAZINE  (APRESOLINE ) tablet 50 mg  50 mg Oral BID Sundil, Subrina, MD   50 mg at 12/20/23 9050   insulin  aspart (novoLOG ) injection 0-6 Units  0-6 Units Subcutaneous TID WC PRN Sundil, Subrina, MD       isosorbide  mononitrate (IMDUR ) 24 hr tablet 30 mg  30 mg Oral Daily Sundil, Subrina, MD   30 mg at 12/20/23 0950   levothyroxine  (SYNTHROID ) tablet 25 mcg  25 mcg Oral Q0600 Sundil, Subrina, MD   25 mcg at 12/20/23 9391   meclizine  (ANTIVERT ) tablet 12.5 mg  12.5 mg Oral TID PRN Sundil, Subrina, MD       pantoprazole  (PROTONIX ) EC tablet 40 mg  40 mg Oral BID AC Sundil, Subrina, MD   40 mg at 12/20/23 0951   sodium chloride  flush (NS) 0.9 % injection 3 mL  3 mL Intravenous Q12H Sundil, Subrina, MD       sodium chloride  flush (NS) 0.9 % injection 3 mL  3 mL Intravenous Q12H Sundil, Subrina, MD       sodium chloride  flush (NS) 0.9 % injection 3 mL  3 mL Intravenous PRN Sundil, Subrina, MD       Current Outpatient Medications  Medication Sig Dispense Refill   amiodarone  (PACERONE ) 200 MG tablet TAKE 1 TABLET BY  MOUTH DAILY 90 tablet 3  apixaban  (ELIQUIS ) 5 MG TABS tablet Take 1 tablet (5 mg total) by mouth 2 (two) times daily. 56 tablet    atorvastatin  (LIPITOR) 40 MG tablet Take 1 tablet (40 mg total) by mouth daily. 90 tablet 3   busPIRone (BUSPAR) 5 MG tablet Take 5 mg by mouth daily as needed (for anxiety).     CALCIUM  PO Take 1 tablet by mouth daily.     carvedilol  (COREG ) 25 MG tablet Take 1 tablet (25 mg total) by mouth 2 (two) times daily with a meal. 180 tablet 3   cholecalciferol (VITAMIN D3) 25 MCG (1000 UNIT) tablet Take 1,000 Units by mouth daily.     Cyanocobalamin  (VITAMIN B-12) 5000 MCG TBDP Take 5,000 mcg by mouth daily.     ELIQUIS  5 MG TABS tablet TAKE 1 TABLET(5 MG) BY MOUTH TWICE DAILY 60 tablet 5   Ferrous Sulfate  (IRON PO) Take 2.5 mLs by mouth daily as needed (for energy). Liquid Iron     folic acid  (FOLVITE ) 400 MCG tablet Take 400 mcg by mouth daily.     hydrALAZINE  (APRESOLINE ) 50 MG tablet TAKE 1 TABLET BY MOUTH TWICE DAILY 180 tablet 3   isosorbide  mononitrate (ISMO ) 20 MG tablet Take 20 mg by mouth at bedtime.     levothyroxine  (SYNTHROID ) 25 MCG tablet Take 1 tablet (25 mcg total) by mouth daily before breakfast. 90 tablet 3   meclizine  (ANTIVERT ) 12.5 MG tablet Take 1 tablet (12.5 mg total) by mouth 3 (three) times daily as needed for dizziness. 180 tablet 1   pantoprazole  (PROTONIX ) 40 MG tablet Take 1 tablet (40 mg total) by mouth 2 (two) times daily before a meal. 180 tablet 3   potassium chloride  SA (KLOR-CON  M20) 20 MEQ tablet Take 1 tablet (20 mEq total) by mouth daily. 90 tablet 3   valsartan -hydrochlorothiazide  (DIOVAN -HCT) 160-25 MG tablet Take 1 tablet by mouth daily. 90 tablet 3   ALPRAZolam  (XANAX ) 0.25 MG tablet Take 0.25 mg by mouth at bedtime as needed for anxiety. (Patient not taking: Reported on 12/20/2023)     gabapentin  (NEURONTIN ) 100 MG capsule Take 1 capsule (100 mg total) by mouth 3 (three) times daily. (Patient not taking: Reported on 12/20/2023) 90  capsule 5   isosorbide  mononitrate (IMDUR ) 30 MG 24 hr tablet Take 1 tablet (30 mg total) by mouth daily. (Patient not taking: Reported on 12/20/2023) 90 tablet 3   nitroGLYCERIN  (NITROSTAT ) 0.4 MG SL tablet Place 1 tablet (0.4 mg total) under the tongue every 5 (five) minutes as needed for chest pain. (Patient not taking: Reported on 12/20/2023) 25 tablet 0    Allergies as of 12/19/2023 - Review Complete 12/19/2023  Allergen Reaction Noted   Oseltamivir Other (See Comments) and Swelling 06/03/2023   Dapagliflozin  12/19/2023   Iodine -131 Nausea Only 03/15/2020   Metformin hcl  12/19/2023   Sulfa antibiotics Hives 03/09/2020    Family History  Problem Relation Age of Onset   Hypertension Mother    Alcohol abuse Mother    Alcohol abuse Father    Hypertension Sister    Stroke Maternal Grandmother     Social History   Socioeconomic History   Marital status: Married    Spouse name: Not on file   Number of children: 1   Years of education: Not on file   Highest education level: Not on file  Occupational History   Occupation: retired  Tobacco Use   Smoking status: Never   Smokeless tobacco: Never  Advertising account planner  Vaping status: Never Used  Substance and Sexual Activity   Alcohol use: Not Currently   Drug use: Never   Sexual activity: Not Currently  Other Topics Concern   Not on file  Social History Narrative   Son   4 grands, greats 63      Retired housekeeping.   Social Drivers of Corporate investment banker Strain: Low Risk  (06/13/2023)   Received from [facility]   Overall Financial Resource Strain (CARDIA)    Difficulty of Paying Living Expenses: Not hard at all  Food Insecurity: No Food Insecurity (06/13/2023)   [facility]   Hunger Vital Sign    Within the past 12 months, you worried that your food would run out before you got the money to buy more.: Never true    Within the past 12 months, the food you bought just didn't last and you didn't  have money to get more.: Never true  Transportation Needs: No Transportation Needs (06/13/2023)   Received from [facility] - Transportation    Lack of Transportation (Medical): No    Lack of Transportation (Non-Medical): No  Physical Activity: Unknown (06/13/2023)   Received from [facility]   Exercise Vital Sign    On average, how many days per week do you engage in moderate to strenuous exercise (like a brisk walk)?: 0 days    Minutes of Exercise per Session: Not on file  Stress: Stress Concern Present (06/13/2023)   Received from [facility] - Occupational Stress Questionnaire    Feeling of Stress : Rather much  Social Connections: Socially Integrated (06/13/2023)   Received from [facility]   Social Network    How would you rate your social network (family, work, friends)?: Good participation with social networks  Intimate Partner Violence: Not At Risk (06/13/2023)   Received from [facility]   HITS    Over the last 12 months how often did your partner physically hurt you?: Never    Over the last 12 months how often did your partner insult you or talk down to you?: Never    Over the last 12 months how often did your partner threaten you with physical harm?: Never    Over the last 12 months how often did your partner scream or curse at you?: Never    Review of Systems: Pertinent positive and negative review of systems were noted in the above HPI section.  All other review of systems was otherwise negative.   Physical Exam: Vital signs in last 24 hours: Temp:  [97.6 F (36.4 C)-98 F (36.7 C)] 98 F (36.7 C) (09/26 0705) Pulse Rate:  [60-65] 62 (09/26 0947) Resp:  [14-22] 20 (09/26 0947) BP: (140-199)/(50-80) 193/70 (09/26 0947) SpO2:  [95 %-100 %] 98 % (09/26 0947) Weight:  [106.6 kg] 106.6 kg (09/25 1536)   General:   Alert,  Well-developed, well-nourished, elderly African-American female pleasant and  cooperative in NAD Head:  Normocephalic and atraumatic. Eyes:  Sclera clear, no icterus.   Conjunctiva pale Ears:  Normal auditory acuity. Nose:  No deformity, discharge,  or lesions. Mouth:  No deformity or lesions.   Neck:  Supple; no masses or thyromegaly. Lungs:  Clear throughout to auscultation.   No wheezes, crackles, or rhonchi.  Heart:  Regular rate and rhythm; no murmurs, clicks, rubs,  or gallops. Abdomen:  Soft, obese, nontender BS active,nonpalp mass or hsm.   Rectal: Done,  documented Hemoccult negative Msk:  Symmetrical without gross deformities. . Pulses:  Normal pulses noted. Extremities:  Without clubbing or edema. Neurologic:  Alert and  oriented x4;  grossly normal neurologically. Skin:  Intact without significant lesions or rashes.. Psych:  Alert and cooperative. Normal mood and affect.  Intake/Output from previous day: No intake/output data recorded. Intake/Output this shift: No intake/output data recorded.  Lab Results: Recent Labs    12/19/23 1612 12/20/23 0327  WBC 6.4 6.3  HGB 8.0* 7.7*  HCT 27.1* 24.6*  PLT 290 249   BMET Recent Labs    12/19/23 1612 12/20/23 0327  NA 137 137  K 4.1 3.4*  CL 103 107  CO2 22 21*  GLUCOSE 121* 166*  BUN 27* 25*  CREATININE 2.00* 1.82*  CALCIUM  8.6* 8.5*   LFT Recent Labs    12/20/23 0327  PROT 6.0*  ALBUMIN 3.0*  AST 27  ALT 23  ALKPHOS 52  BILITOT 0.9   PT/INR No results for input(s): LABPROT, INR in the last 72 hours. Hepatitis Panel No results for input(s): HEPBSAG, HCVAB, HEPAIGM, HEPBIGM in the last 72 hours.   IMPRESSION:  #75 78 year old African-American female on chronic Eliquis  with history of atrial fibrillation and status post previous TAVR, also with coronary artery disease status post multiple stents. Patient had complaints of fatigue and some lightheadedness, felt poorly after recent influenza vaccine.  Outpatient labs yesterday with hemoglobin in the 8 range and  directed to the emergency room.  She has been hemodynamically stable here has been noted to have a junctional rhythm with mild bradycardia in the 50-60 range  Hemoglobin 8 here MCV 78 and iron studies consistent with iron deficiency. Stool Hemoccult negative  #2 iron deficiency anemia-patient reports long history of iron deficiency anemia dating back many years.  She had been receiving intermittent iron infusions while she was living in [place] , but did not keep up with those after she moved here in 2021 Etiology of the iron deficiency is not entirely clear, she has had prior colonoscopies the last 1 probably close to 10 years ago. She has been chronically anemic over the past year or so with hemoglobin in the 10.8 range 1 year ago and hemoglobin of 8.52 months ago.  #3 chronic kidney disease stage IIIb #4.  Diabetes mellitus #5 Jehovah's Witness, declines blood products #6 abnormal noncontrasted CT of the abdomen and pelvis with hypodense left hepatic masses.  New since 2022 Rule out benign versus malignant lesions.  Plan; Patient is receiving an iron infusion currently, declines blood transfusions On initial conversation she is not interested in pursuing aggressive workup for the iron deficiency anemia as this has been a longstanding problem.  We briefly discussed colonoscopy/EGD. Will proceed with MRI of the liver with and without to further evaluate the liver lesions noted on CT Further recommendations from GI perspective pending results of MRI   Jake Goodson EsterwoodPA-C  12/20/2023, 10:02 AM

## 2023-12-20 NOTE — ED Notes (Addendum)
 Pt pressed call bell. This NT answered via phone at nursing station. Pt states My chest is hurting. This NT makes RN aware and proceeds to check on pt. Pt states again It feels like pressure is on my chest. This NT assured pt that the message was passed along to the RN. After 2 minutes exactly Pt called out again. This NT answers via phone at nurse station. Pt states Where is everybody at. I'm in pain. I need a phone. This NT assures pt again that her complaint has been escalated to the RN and will be handled in a timely manner. Pt states Does this phone call out?, Can I call home?, I want to go home. Answering the pt this NT stated Yes, the phone does call out. Pt states  I want to go home. RN made aware of interaction.

## 2023-12-21 ENCOUNTER — Inpatient Hospital Stay (HOSPITAL_COMMUNITY)

## 2023-12-21 DIAGNOSIS — K769 Liver disease, unspecified: Secondary | ICD-10-CM

## 2023-12-21 DIAGNOSIS — I160 Hypertensive urgency: Secondary | ICD-10-CM | POA: Diagnosis not present

## 2023-12-21 DIAGNOSIS — D509 Iron deficiency anemia, unspecified: Secondary | ICD-10-CM | POA: Diagnosis not present

## 2023-12-21 DIAGNOSIS — C801 Malignant (primary) neoplasm, unspecified: Secondary | ICD-10-CM | POA: Diagnosis not present

## 2023-12-21 DIAGNOSIS — D649 Anemia, unspecified: Secondary | ICD-10-CM | POA: Diagnosis not present

## 2023-12-21 DIAGNOSIS — R932 Abnormal findings on diagnostic imaging of liver and biliary tract: Secondary | ICD-10-CM

## 2023-12-21 DIAGNOSIS — E038 Other specified hypothyroidism: Secondary | ICD-10-CM | POA: Diagnosis not present

## 2023-12-21 DIAGNOSIS — C229 Malignant neoplasm of liver, not specified as primary or secondary: Secondary | ICD-10-CM | POA: Diagnosis not present

## 2023-12-21 DIAGNOSIS — K8020 Calculus of gallbladder without cholecystitis without obstruction: Secondary | ICD-10-CM | POA: Diagnosis not present

## 2023-12-21 DIAGNOSIS — N1832 Chronic kidney disease, stage 3b: Secondary | ICD-10-CM | POA: Diagnosis not present

## 2023-12-21 LAB — GLUCOSE, CAPILLARY
Glucose-Capillary: 108 mg/dL — ABNORMAL HIGH (ref 70–99)
Glucose-Capillary: 189 mg/dL — ABNORMAL HIGH (ref 70–99)
Glucose-Capillary: 181 mg/dL — ABNORMAL HIGH (ref 70–99)
Glucose-Capillary: 103 mg/dL — ABNORMAL HIGH (ref 70–99)
Glucose-Capillary: 149 mg/dL — ABNORMAL HIGH (ref 70–99)

## 2023-12-21 LAB — BASIC METABOLIC PANEL WITH GFR
Anion gap: 8 (ref 5–15)
BUN: 24 mg/dL — ABNORMAL HIGH (ref 8–23)
CO2: 22 mmol/L (ref 22–32)
Calcium: 8.2 mg/dL — ABNORMAL LOW (ref 8.9–10.3)
Chloride: 107 mmol/L (ref 98–111)
Creatinine, Ser: 1.68 mg/dL — ABNORMAL HIGH (ref 0.44–1.00)
GFR, Estimated: 31 mL/min — ABNORMAL LOW (ref >60)
Glucose, Bld: 92 mg/dL (ref 70–99)
Potassium: 3.7 mmol/L (ref 3.5–5.1)
Sodium: 137 mmol/L (ref 135–145)

## 2023-12-21 LAB — CBC
HCT: 23.1 % — ABNORMAL LOW (ref 36.0–46.0)
Hemoglobin: 7.2 g/dL — ABNORMAL LOW (ref 12.0–15.0)
MCH: 23.9 pg — ABNORMAL LOW (ref 26.0–34.0)
MCHC: 31.2 g/dL (ref 30.0–36.0)
MCV: 76.7 fL — ABNORMAL LOW (ref 80.0–100.0)
Platelets: 242 K/uL (ref 150–400)
RBC: 3.01 MIL/uL — ABNORMAL LOW (ref 3.87–5.11)
RDW: 16.8 % — ABNORMAL HIGH (ref 11.5–15.5)
WBC: 6 K/uL (ref 4.0–10.5)
nRBC: 0 % (ref 0.0–0.2)

## 2023-12-21 MED ORDER — LORAZEPAM 2 MG/ML IJ SOLN
1.0000 mg | Freq: Once | INTRAMUSCULAR | Status: AC | PRN
  Administered 2023-12-21: 1 mg via INTRAVENOUS
  Filled 2023-12-21: qty 1

## 2023-12-21 MED ORDER — SODIUM CHLORIDE 0.9 % IV BOLUS
250.0000 mL | Freq: Once | INTRAVENOUS | Status: AC
  Administered 2023-12-21: 250 mL via INTRAVENOUS

## 2023-12-21 MED ORDER — GADOBUTROL 1 MMOL/ML IV SOLN
10.0000 mL | Freq: Once | INTRAVENOUS | Status: AC | PRN
Start: 2023-12-21 — End: 2023-12-21
  Administered 2023-12-21: 10 mL via INTRAVENOUS

## 2023-12-21 MED ORDER — POLYETHYLENE GLYCOL 3350 17 G PO PACK
17.0000 g | PACK | Freq: Every day | ORAL | Status: DC
  Administered 2023-12-21 – 2023-12-24 (×2): 17 g via ORAL
  Filled 2023-12-21 (×4): qty 1

## 2023-12-21 NOTE — Plan of Care (Signed)

## 2023-12-21 NOTE — Progress Notes (Signed)
 PROGRESS NOTE        PATIENT DETAILS Name: Destiny Rice Age: 78 y.o. Sex: female Date of Birth: 18-Apr-1945 Admit Date: 12/19/2023 Admitting Physician Micaela Speaker, MD ERE:Hjcnlm, Camellia, MD  Brief Summary: Patient is a 78 y.o.  female with known history of chronic iron deficiency anemia, CKD stage IIIb, PAF on Eliquis , severe AS-s/p TAVR, DM-2 who presented with fatigue/shortness of breath-upon further evaluation-she was found to have worsening anemia and new liver mass.  Significant events: 9/25>> admit to [facility]  Significant studies: 9/25>> CXR: No PNA 9/25>> CT abdomen/pelvis: Hypodense mass in the left hepatic dome 5.3 x 3.1 cm, second rounded hypodense lesion within segment 7 measuring 2.3 cm. 9/26>> echo: EF 60-65%, moderate to severe mitral stenosis-prosthetic annuloplasty ring present, no aortic stenosis-TAVR valve present.  Significant microbiology data: None  Procedures: None  Consults: GI  Subjective: Feels much better.  No chest pain or shortness of breath.  Could not tolerate MRI last night due to claustrophobia-she is willing to retry it with IV Ativan .  Objective: Vitals: Blood pressure (!) 152/52, pulse (!) 58, temperature 98.8 F (37.1 C), temperature source Oral, resp. rate 20, height 5' 5 (1.651 m), weight 106.6 kg, SpO2 95%.   Exam: Gen Exam:Alert awake-not in any distress HEENT:atraumatic, normocephalic Chest: B/L clear to auscultation anteriorly CVS:S1S2 regular Abdomen:soft non tender, non distended Extremities:no edema Neurology: Non focal Skin: no rash  Pertinent Labs/Radiology:    Latest Ref Rng & Units 12/21/2023    2:19 AM 12/20/2023    3:27 AM 12/19/2023    4:12 PM  CBC  WBC 4.0 - 10.5 K/uL 6.0  6.3  6.4   Hemoglobin 12.0 - 15.0 g/dL 7.2  7.7  8.0   Hematocrit 36.0 - 46.0 % 23.1  24.6  27.1   Platelets 150 - 400 K/uL 242  249  290     Lab Results  Component Value Date   NA 137 12/21/2023   K 3.7  12/21/2023   CL 107 12/21/2023   CO2 22 12/21/2023      Assessment/Plan: Fatigue/exertional dyspnea-likely secondary to symptomatic microcytic-iron deficiency anemia No obvious blood loss-brown stools-FOBT negative on 9/25 S/p IV iron yesterday Now on oral iron supplementation This is a longstanding history-she has had prior IV iron as an outpatient Per GI note-patient not keen on pursuing EGD/colonoscopy-prior colonoscopies have been unremarkable.   She is a [employer] does not want any blood products transfused Follow CBC.  Newly diagnosed liver mass Concern for malignancy Could not tolerate MRI liver last night due to claustrophobia-Will reattempt today with IV Ativan . GI following. MRI report will determine further plans-May need to send out tumor markers.  AKI on CKD stage IIIb AKI likely hemodynamically mediated-managed with supportive care-creatinine improved and back to baseline.  HTN BP stable on Coreg , hydralazine , Imdur , Avapro /HCTZ  DM-2 (A1c 6.8 on 9/26) CBG stable SSI  PAF Telemetry monitoring Coreg /amiodarone  Remains on Eliquis .  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  Hypothyroidism Synthroid   Class 2 Obesity: Estimated body mass index is 39.11 kg/m as calculated from the following:   Height as of this encounter: 5' 5 (1.651 m).   Weight as of this encounter: 106.6  kg.   Code status:   Code Status: Full Code   DVT Prophylaxis: SCDs Start: 12/19/23 2341 Place TED hose Start: 12/19/23 2341 apixaban  (ELIQUIS ) tablet 5 mg     Family Communication: None at bedside   Disposition Plan: Status is: Inpatient Remains inpatient appropriate because: Severity of illness   Planned Discharge Destination:Home   Diet: Diet Order              Diet Heart Room service appropriate? Yes; Fluid consistency: Thin  Diet effective now                     Antimicrobial agents: Anti-infectives (From admission, onward)    None        MEDICATIONS: Scheduled Meds:  amiodarone   200 mg Oral Daily   apixaban   5 mg Oral BID   atorvastatin   20 mg Oral Daily   carvedilol   25 mg Oral BID WC   ferrous sulfate   325 mg Oral Q breakfast   folic acid   500 mcg Oral Daily   gabapentin   100 mg Oral TID   hydrALAZINE   50 mg Oral BID   irbesartan   150 mg Oral Daily   And   hydrochlorothiazide   25 mg Oral Daily   isosorbide  mononitrate  30 mg Oral Daily   levothyroxine   25 mcg Oral Q0600   pantoprazole   40 mg Oral BID AC   polyethylene glycol  17 g Oral Daily   sodium chloride  flush  3 mL Intravenous Q12H   sodium chloride  flush  3 mL Intravenous Q12H   Continuous Infusions: PRN Meds:.acetaminophen  **OR** acetaminophen , ALPRAZolam , alum & mag hydroxide-simeth, hydrALAZINE , insulin  aspart, LORazepam , meclizine , metoCLOPramide  (REGLAN ) injection, sodium chloride  flush   I have personally reviewed following labs and imaging studies  LABORATORY DATA: CBC: Recent Labs  Lab 12/19/23 1612 12/20/23 0327 12/21/23 0219  WBC 6.4 6.3 6.0  NEUTROABS 4.9  --   --   HGB 8.0* 7.7* 7.2*  HCT 27.1* 24.6* 23.1*  MCV 78.3* 76.9* 76.7*  PLT 290 249 242    Basic Metabolic Panel: Recent Labs  Lab 12/19/23 1612 12/20/23 0327 12/21/23 0219  NA 137 137 137  K 4.1 3.4* 3.7  CL 103 107 107  CO2 22 21* 22  GLUCOSE 121* 166* 92  BUN 27* 25* 24*  CREATININE 2.00* 1.82* 1.68*  CALCIUM  8.6* 8.5* 8.2*    GFR: Estimated Creatinine Clearance: 32.9 mL/min (A) (by C-G formula based on SCr of 1.68 mg/dL (H)).  Liver Function Tests: Recent Labs  Lab 12/19/23 1612 12/20/23 0327  AST 31 27  ALT 28 23  ALKPHOS 57 52  BILITOT 0.6 0.9  PROT 6.6 6.0*  ALBUMIN 3.3* 3.0*   No results for input(s): LIPASE, AMYLASE in the last 168  hours. No results for input(s): AMMONIA in the last 168 hours.  Coagulation Profile: No results for input(s): INR, PROTIME in the last 168 hours.  Cardiac Enzymes: No results for input(s): CKTOTAL, CKMB, CKMBINDEX, TROPONINI in the last 168 hours.  BNP (last 3 results) No results for input(s): PROBNP in the last 8760 hours.  Lipid Profile: No results for input(s): CHOL, HDL, LDLCALC, TRIG, CHOLHDL, LDLDIRECT in the last 72 hours.  Thyroid  Function Tests: No results for input(s): TSH, T4TOTAL, FREET4, T3FREE, THYROIDAB in the last 72 hours.  Anemia Panel: Recent Labs    12/20/23 0327 12/20/23 0534  VITAMINB12  --  2,005*  FOLATE >20.0  --   FERRITIN  --  16  TIBC  --  382  IRON  --  30  RETICCTPCT 1.0  --     Urine analysis:    Component Value Date/Time   COLORURINE YELLOW 03/10/2020 0330   APPEARANCEUR CLEAR 03/10/2020 0330   LABSPEC 1.012 03/10/2020 0330   PHURINE 6.0 03/10/2020 0330   GLUCOSEU NEGATIVE 03/10/2020 0330   HGBUR NEGATIVE 03/10/2020 0330   BILIRUBINUR NEGATIVE 03/10/2020 0330   KETONESUR NEGATIVE 03/10/2020 0330   PROTEINUR NEGATIVE 03/10/2020 0330   NITRITE NEGATIVE 03/10/2020 0330   LEUKOCYTESUR NEGATIVE 03/10/2020 0330    Sepsis Labs: Lactic Acid, Venous No results found for: LATICACIDVEN  MICROBIOLOGY: No results found for this or any previous visit (from the past 240 hours).  RADIOLOGY STUDIES/RESULTS: ECHOCARDIOGRAM COMPLETE Result Date: 12/20/2023    ECHOCARDIOGRAM REPORT   Patient Name:   NADYA HOPWOOD Date of Exam: 12/20/2023 Medical Rec #:  [id number]      Height:       65.0 in Accession #:    [id number]     Weight:       235.0 lb Date of Birth:  [birth year]      BSA:          2.118 m Patient Age:    78 years       BP:           144/60 mmHg Patient Gender: F              HR:           63 bpm. Exam Location:  Inpatient Procedure: 2D Echo, Cardiac Doppler and Color Doppler (Both Spectral and Color             Flow Doppler were utilized during procedure). Indications:    Elevated brain natriuretic peptide (BNP) level [[id number]]  History:        Patient has prior history of Echocardiogram examinations, most                 recent 04/23/2022. CAD; Risk Factors:Hypertension, Diabetes and                 Dyslipidemia.                 Aortic Valve: 26 mm CoreValve-Evolut Pro prosthetic, stented                 (TAVR) valve is present in the aortic position. Procedure Date:                 04/24/2018.                 Mitral Valve: prosthetic annuloplasty ring valve is present in                 the mitral position. Procedure Date: 12/09/2012.  Sonographer:    Philomena Daring Referring Phys: [id number] SUBRINA SUNDIL IMPRESSIONS  1. Left ventricular ejection fraction, by estimation, is 60 to 65%. The left ventricle has normal function. The left ventricle has no regional wall motion abnormalities. There is mild left ventricular hypertrophy. Left ventricular diastolic parameters are indeterminate. There is the interventricular septum is flattened in systole and diastole, consistent with right ventricular pressure and volume overload.  2. Right ventricular systolic function is normal. The right ventricular size is normal. There is normal pulmonary artery systolic pressure. The estimated right ventricular systolic pressure is 30.1 mmHg.  3. The mitral valve has been repaired/replaced. No evidence of mitral valve regurgitation. 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  The mean mitral valve gradient is 12.0 mmHg. There is a prosthetic annuloplasty ring present in the mitral position. Procedure  Date: 12/09/2012.  4. The aortic valve has been repaired/replaced. Aortic valve regurgitation is not visualized. No aortic stenosis is present. There is a 26 mm CoreValve-Evolut Pro prosthetic (TAVR) valve present in the aortic position. Procedure Date: 04/24/2018.  5. The inferior vena cava is dilated in size with <50% respiratory variability, suggesting  right atrial pressure of 15 mmHg. FINDINGS  Left Ventricle: Left ventricular ejection fraction, by estimation, is 60 to 65%. The left ventricle has normal function. The left ventricle has no regional wall motion abnormalities. The left ventricular internal cavity size was normal in size. There is  mild left ventricular hypertrophy. The interventricular septum is flattened in systole and diastole, consistent with right ventricular pressure and volume overload. Left ventricular diastolic parameters are indeterminate. Right Ventricle: The right ventricular size is normal. No increase in right ventricular wall thickness. Right ventricular systolic function is normal. There is normal pulmonary artery systolic pressure. The tricuspid regurgitant velocity is 1.94 m/s, and  with an assumed right atrial pressure of 15 mmHg, the estimated right ventricular systolic pressure is 30.1 mmHg. Left Atrium: Left atrial size was normal in size. Right Atrium: Right atrial size was normal in size. Pericardium: There is no evidence of pericardial effusion. Mitral Valve: The mitral valve has been repaired/replaced. No evidence of mitral valve regurgitation. There is a prosthetic annuloplasty ring present in the mitral position. Procedure Date: 12/09/2012. Moderate to severe mitral valve stenosis. MV peak gradient, 26.4 mmHg. The mean mitral valve gradient is 12.0 mmHg. Tricuspid Valve: The tricuspid valve is normal in structure. Tricuspid valve regurgitation is mild . No evidence of tricuspid stenosis. Aortic Valve: The aortic valve has been repaired/replaced. Aortic valve regurgitation is not visualized. No aortic stenosis is present. There is a 26 mm CoreValve-Evolut Pro prosthetic, stented (TAVR) valve present in the aortic position. Procedure Date:  04/24/2018. Pulmonic Valve: The pulmonic valve was normal in structure. Pulmonic valve regurgitation is not visualized. No evidence of pulmonic stenosis. Aorta: The aortic root is normal in  size and structure. Venous: The inferior vena cava is dilated in size with less than 50% respiratory variability, suggesting right atrial pressure of 15 mmHg. IAS/Shunts: No atrial level shunt detected by color flow Doppler.  LEFT VENTRICLE PLAX 2D LVIDd:         4.20 cm   Diastology LVIDs:         2.70 cm   LV e' medial:  5.87 cm/s LV PW:         1.20 cm   LV e' lateral: 5.98 cm/s LV IVS:        1.30 cm LVOT diam:     1.70 cm LV SV:         51 LV SV Index:   24 LVOT Area:     2.27 cm  RIGHT VENTRICLE            IVC RV S prime:     8.16 cm/s  IVC diam: 2.40 cm TAPSE (M-mode): 1.7 cm LEFT ATRIUM             Index        RIGHT ATRIUM           Index LA Vol (A2C):   60.2 ml 28.42 ml/m  RA Area:     15.40 cm LA Vol (A4C):   56.0 ml 26.43 ml/m  RA Volume:  37.70 ml  17.80 ml/m LA Biplane Vol: 62.0 ml 29.27 ml/m  AORTIC VALVE LVOT Vmax:   90.20 cm/s LVOT Vmean:  61.100 cm/s LVOT VTI:    0.223 m  AORTA Ao Asc diam: 2.40 cm MITRAL VALVE              TRICUSPID VALVE MV Area VTI:  0.73 cm    TR Peak grad:   15.1 mmHg MV Peak grad: 26.4 mmHg   TR Vmax:        194.00 cm/s MV Mean grad: 12.0 mmHg MV Vmax:      2.57 m/s    SHUNTS MV Vmean:     167.0 cm/s  Systemic VTI:  0.22 m                           Systemic Diam: 1.70 cm Oneil Parchment MD Electronically signed by Oneil Parchment MD Signature Date/Time: 12/20/2023/10:37:58 AM    Final    CT ABDOMEN PELVIS WO CONTRAST Result Date: 12/19/2023 EXAM: CT ABDOMEN AND PELVIS WITHOUT CONTRAST 12/19/2023 07:53:00 PM TECHNIQUE: CT of the abdomen and pelvis was performed without the administration of intravenous contrast. Multiplanar reformatted images are provided for review. Automated exposure control, iterative reconstruction, and/or weight-based adjustment of the mA/kV was utilized to reduce the radiation dose to as low as reasonably achievable. COMPARISON: Comparison is made to March 10, 2020. CLINICAL HISTORY: Abdominal pain, acute, nonlocalized; ABD PAIN, low hgb, ckd. Chief  complaints; Shortness of Breath; CT ABDOMEN PELVIS WO CONTRAST; Abdominal pain, acute, nonlocalized, ABD PAIN, low hgb, ckd. FINDINGS: LOWER CHEST: Hypoattenuation of the cardiac blood pool in keeping with at least moderate anemia. Moderate hiatal hernia. LIVER: Interval development of a lobulated hypodense mass within the left hepatic dome measuring 5.3 x 2.5 is not mentioned but a 5.3 x 3.1 cm mass at image 10/3 and a second rounded hypodense lesion within segment 7 measuring 2.3 cm at image 17/3. These are indeterminate on this noncontrast examination. GALLBLADDER AND BILE DUCTS: Cholelithiasis without superimposed pericholecystic inflammatory change. No intra or extrahepatic biliary ductal dilation. SPLEEN: No acute abnormality. PANCREAS: No acute abnormality. ADRENAL GLANDS: No acute abnormality. KIDNEYS, URETERS AND BLADDER: Simple cortical cysts are seen within the kidneys bilaterally for which no follow-up imaging is recommended. Vascular calcifications are noted within the renal hila bilaterally. No ureteral calculi. No hydronephrosis. No perinephric inflammatory stranding or fluid collections were identified. The bladder is unremarkable. GI AND BOWEL: Colonic diverticulosis without superimposed acute inflammatory change. The stomach, small bowel, and large bowel are otherwise unremarkable. Appendix is normal. PERITONEUM AND RETROPERITONEUM: No ascites. No free air. VASCULATURE: Extensive aortoiliac atherosclerotic calcification. Particularly prominent atherosclerotic calcifications at the origin of the mesenteric vasculature. LYMPH NODES: No lymphadenopathy. REPRODUCTIVE ORGANS: Status post hysterectomy. No adnexal mass. BONES AND SOFT TISSUES: Osseous structures are age appropriate. No acute bone abnormality. No lytic or blastic bone lesion. IMPRESSION: 1. Interval development of a lobulated hypodense mass within the left hepatic dome measuring 5.3 x 3.1 cm and a second rounded hypodense lesion within  segment 7 measuring 2.3 cm, indeterminate on this noncontrast examination. 2. Cholelithiasis without superimposed pericholecystic inflammatory change. No intra or extrahepatic biliary ductal dilation. 3. Colonic diverticulosis without superimposed acute inflammatory change. 4. Moderate hiatal hernia. 5. Peripheral vascular disease. Particularly prominent atherosclerotic calcifications at the origin of the mesenteric vasculature. If there is clinical evidence of acute or chronic mesenteric ischemia, CT arteriography may be more helpful for further evaluation.  Electronically signed by: Dorethia Molt MD 12/19/2023 08:05 PM EDT RP Workstation: [id number]   DG Chest 2 View Result Date: 12/19/2023 CLINICAL DATA:  Shortness of breath. EXAM: CHEST - 2 VIEW COMPARISON:  11/22/2022 FINDINGS: Borderline cardiomegaly. No pulmonary vascular congestion. Postsurgical changes of valve replacement are seen. Lungs are clear. IMPRESSION: Borderline cardiomegaly. Electronically Signed   By: Aliene Lloyd M.D.   On: 12/19/2023 17:05     LOS: 2 days   Donalda Applebaum, MD  [facility]    To contact the attending provider between 7A-7P or the covering provider during after hours 7P-7A, please log into the web site [email or web address] and access using universal [place] password for that web site. If you do not have the password, please call the hospital operator.  12/21/2023, 9:02 AM

## 2023-12-21 NOTE — Progress Notes (Signed)
 Progress Note   Assessment    78 year old female with atrial fibrillation on Eliquis , CAD with prior PCI, CKD, diabetes, history of TAVR, hypertension, hypothyroidism, longstanding iron deficiency anemia admitted with symptomatic heme-negative stool iron deficiency anemia found to have 2 yet to be fully characterized liver masses.  Principal Problem:   Symptomatic anemia Active Problems:   Hypertensive urgency   Paroxysmal atrial fibrillation (HCC)   CKD stage 3b, GFR 30-44 ml/min (HCC)   Non-insulin  dependent type 2 diabetes mellitus (HCC)   Hypothyroidism   History of aortic stenosis s/p TAVR   History of CAD (coronary artery disease)   Hyperlipidemia   Peripheral artery disease   Recommendations   1.  IDA, chronic --she has received IV iron.  She does not wish to pursue blood transfusion based on religious beliefs.  Stools heme-negative.  We again discussed upper endoscopy and colonoscopy and patient declines as she reports her iron deficiency anemia is longstanding and previous endoscopic evaluations have been unrevealing. -- IV iron as needed to fully replete IV iron  2.  Newly discussed liver masses --MRI unsuccessful due to claustrophobia.  I discussed this with her at length and she is agreeable to retry.  Also talk to radiology and we can better characterize these masses with IV contrast. -- Change MRI abdomen exam to with contrast -- Oral lorazepam  1 mg 30 minutes before MRI tomorrow morning -- N.p.o. after midnight until after MRI and then she can resume regular diet  3.  AKI on CKD stage IIIb --improving.  4.  Hypertension/diabetes --per hospitalist medicine  35 minutes total spent today including patient facing time, coordination of care, reviewing medical history/procedures/pertinent radiology studies, and documentation of the encounter.    Chief Complaint   Patient attempted MRI but became anxious in the scanner and stopped the study prematurely No  abdominal pain Eating a regular diet today Still does not wish to pursue colonoscopy or upper endoscopy because she states that her iron deficiency anemia is an old issue that has been worked up previously both in [place]  and [place]  No chest pain or shortness of breath  Vital signs in last 24 hours: Temp:  [97.7 F (36.5 C)-98.8 F (37.1 C)] 98.4 F (36.9 C) (09/27 1200) Pulse Rate:  [54-64] 54 (09/27 1200) Resp:  [15-20] 20 (09/27 1200) BP: (118-152)/(44-60) 132/49 (09/27 1200) SpO2:  [95 %] 95 % (09/27 0410) Last BM Date : 12/21/23 Gen: awake, alert, NAD HEENT: anicteric  CV: RRR, no mrg Pulm: CTA b/l Abd: soft, obese, NT/ND, +BS throughout Ext: no c/c/, trace peripheral edema Neuro: nonfocal  Lab Results: Recent Labs    12/19/23 1612 12/20/23 0327 12/21/23 0219  WBC 6.4 6.3 6.0  HGB 8.0* 7.7* 7.2*  HCT 27.1* 24.6* 23.1*  PLT 290 249 242   BMET Recent Labs    12/19/23 1612 12/20/23 0327 12/21/23 0219  NA 137 137 137  K 4.1 3.4* 3.7  CL 103 107 107  CO2 22 21* 22  GLUCOSE 121* 166* 92  BUN 27* 25* 24*  CREATININE 2.00* 1.82* 1.68*  CALCIUM  8.6* 8.5* 8.2*   LFT Recent Labs    12/20/23 0327  PROT 6.0*  ALBUMIN 3.0*  AST 27  ALT 23  ALKPHOS 52  BILITOT 0.9   Studies/Results: ECHOCARDIOGRAM COMPLETE Result Date: 12/20/2023    ECHOCARDIOGRAM REPORT   Patient Name:   JAYCELYNN KNICKERBOCKER Date of Exam: 12/20/2023 Medical Rec #:  [id number]      Height:  65.0 in Accession #:    [id number]     Weight:       235.0 lb Date of Birth:  [birth year]      BSA:          2.118 m Patient Age:    78 years       BP:           144/60 mmHg Patient Gender: F              HR:           63 bpm. Exam Location:  Inpatient Procedure: 2D Echo, Cardiac Doppler and Color Doppler (Both Spectral and Color            Flow Doppler were utilized during procedure). Indications:    Elevated brain natriuretic peptide (BNP) level [[id number]]  History:        Patient has prior history of  Echocardiogram examinations, most                 recent 04/23/2022. CAD; Risk Factors:Hypertension, Diabetes and                 Dyslipidemia.                 Aortic Valve: 26 mm CoreValve-Evolut Pro prosthetic, stented                 (TAVR) valve is present in the aortic position. Procedure Date:                 04/24/2018.                 Mitral Valve: prosthetic annuloplasty ring valve is present in                 the mitral position. Procedure Date: 12/09/2012.  Sonographer:    Philomena Daring Referring Phys: [id number] SUBRINA SUNDIL IMPRESSIONS  1. Left ventricular ejection fraction, by estimation, is 60 to 65%. The left ventricle has normal function. The left ventricle has no regional wall motion abnormalities. There is mild left ventricular hypertrophy. Left ventricular diastolic parameters are indeterminate. There is the interventricular septum is flattened in systole and diastole, consistent with right ventricular pressure and volume overload.  2. Right ventricular systolic function is normal. The right ventricular size is normal. There is normal pulmonary artery systolic pressure. The estimated right ventricular systolic pressure is 30.1 mmHg.  3. The mitral valve has been repaired/replaced. No evidence of mitral valve regurgitation. 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 The mean mitral valve gradient is 12.0 mmHg. There is a prosthetic annuloplasty ring present in the mitral position. Procedure  Date: 12/09/2012.  4. The aortic valve has been repaired/replaced. Aortic valve regurgitation is not visualized. No aortic stenosis is present. There is a 26 mm CoreValve-Evolut Pro prosthetic (TAVR) valve present in the aortic position. Procedure Date: 04/24/2018.  5. The inferior vena cava is dilated in size with <50% respiratory variability, suggesting right atrial pressure of 15 mmHg. FINDINGS  Left Ventricle: Left ventricular ejection fraction, by estimation, is 60 to 65%. The left ventricle has normal function. The  left ventricle has no regional wall motion abnormalities. The left ventricular internal cavity size was normal in size. There is  mild left ventricular hypertrophy. The interventricular septum is flattened in systole and diastole, consistent with right ventricular pressure and volume overload. Left ventricular diastolic parameters are indeterminate. Right Ventricle: The right ventricular size is normal. No increase in right ventricular wall thickness.  Right ventricular systolic function is normal. There is normal pulmonary artery systolic pressure. The tricuspid regurgitant velocity is 1.94 m/s, and  with an assumed right atrial pressure of 15 mmHg, the estimated right ventricular systolic pressure is 30.1 mmHg. Left Atrium: Left atrial size was normal in size. Right Atrium: Right atrial size was normal in size. Pericardium: There is no evidence of pericardial effusion. Mitral Valve: The mitral valve has been repaired/replaced. No evidence of mitral valve regurgitation. There is a prosthetic annuloplasty ring present in the mitral position. Procedure Date: 12/09/2012. Moderate to severe mitral valve stenosis. MV peak gradient, 26.4 mmHg. The mean mitral valve gradient is 12.0 mmHg. Tricuspid Valve: The tricuspid valve is normal in structure. Tricuspid valve regurgitation is mild . No evidence of tricuspid stenosis. Aortic Valve: The aortic valve has been repaired/replaced. Aortic valve regurgitation is not visualized. No aortic stenosis is present. There is a 26 mm CoreValve-Evolut Pro prosthetic, stented (TAVR) valve present in the aortic position. Procedure Date:  04/24/2018. Pulmonic Valve: The pulmonic valve was normal in structure. Pulmonic valve regurgitation is not visualized. No evidence of pulmonic stenosis. Aorta: The aortic root is normal in size and structure. Venous: The inferior vena cava is dilated in size with less than 50% respiratory variability, suggesting right atrial pressure of 15 mmHg.  IAS/Shunts: No atrial level shunt detected by color flow Doppler.  LEFT VENTRICLE PLAX 2D LVIDd:         4.20 cm   Diastology LVIDs:         2.70 cm   LV e' medial:  5.87 cm/s LV PW:         1.20 cm   LV e' lateral: 5.98 cm/s LV IVS:        1.30 cm LVOT diam:     1.70 cm LV SV:         51 LV SV Index:   24 LVOT Area:     2.27 cm  RIGHT VENTRICLE            IVC RV S prime:     8.16 cm/s  IVC diam: 2.40 cm TAPSE (M-mode): 1.7 cm LEFT ATRIUM             Index        RIGHT ATRIUM           Index LA Vol (A2C):   60.2 ml 28.42 ml/m  RA Area:     15.40 cm LA Vol (A4C):   56.0 ml 26.43 ml/m  RA Volume:   37.70 ml  17.80 ml/m LA Biplane Vol: 62.0 ml 29.27 ml/m  AORTIC VALVE LVOT Vmax:   90.20 cm/s LVOT Vmean:  61.100 cm/s LVOT VTI:    0.223 m  AORTA Ao Asc diam: 2.40 cm MITRAL VALVE              TRICUSPID VALVE MV Area VTI:  0.73 cm    TR Peak grad:   15.1 mmHg MV Peak grad: 26.4 mmHg   TR Vmax:        194.00 cm/s MV Mean grad: 12.0 mmHg MV Vmax:      2.57 m/s    SHUNTS MV Vmean:     167.0 cm/s  Systemic VTI:  0.22 m                           Systemic Diam: 1.70 cm Oneil Parchment MD Electronically signed by Oneil Parchment MD Signature Date/Time:  12/20/2023/10:37:58 AM    Final    CT ABDOMEN PELVIS WO CONTRAST Result Date: 12/19/2023 EXAM: CT ABDOMEN AND PELVIS WITHOUT CONTRAST 12/19/2023 07:53:00 PM TECHNIQUE: CT of the abdomen and pelvis was performed without the administration of intravenous contrast. Multiplanar reformatted images are provided for review. Automated exposure control, iterative reconstruction, and/or weight-based adjustment of the mA/kV was utilized to reduce the radiation dose to as low as reasonably achievable. COMPARISON: Comparison is made to March 10, 2020. CLINICAL HISTORY: Abdominal pain, acute, nonlocalized; ABD PAIN, low hgb, ckd. Chief complaints; Shortness of Breath; CT ABDOMEN PELVIS WO CONTRAST; Abdominal pain, acute, nonlocalized, ABD PAIN, low hgb, ckd. FINDINGS: LOWER CHEST:  Hypoattenuation of the cardiac blood pool in keeping with at least moderate anemia. Moderate hiatal hernia. LIVER: Interval development of a lobulated hypodense mass within the left hepatic dome measuring 5.3 x 2.5 is not mentioned but a 5.3 x 3.1 cm mass at image 10/3 and a second rounded hypodense lesion within segment 7 measuring 2.3 cm at image 17/3. These are indeterminate on this noncontrast examination. GALLBLADDER AND BILE DUCTS: Cholelithiasis without superimposed pericholecystic inflammatory change. No intra or extrahepatic biliary ductal dilation. SPLEEN: No acute abnormality. PANCREAS: No acute abnormality. ADRENAL GLANDS: No acute abnormality. KIDNEYS, URETERS AND BLADDER: Simple cortical cysts are seen within the kidneys bilaterally for which no follow-up imaging is recommended. Vascular calcifications are noted within the renal hila bilaterally. No ureteral calculi. No hydronephrosis. No perinephric inflammatory stranding or fluid collections were identified. The bladder is unremarkable. GI AND BOWEL: Colonic diverticulosis without superimposed acute inflammatory change. The stomach, small bowel, and large bowel are otherwise unremarkable. Appendix is normal. PERITONEUM AND RETROPERITONEUM: No ascites. No free air. VASCULATURE: Extensive aortoiliac atherosclerotic calcification. Particularly prominent atherosclerotic calcifications at the origin of the mesenteric vasculature. LYMPH NODES: No lymphadenopathy. REPRODUCTIVE ORGANS: Status post hysterectomy. No adnexal mass. BONES AND SOFT TISSUES: Osseous structures are age appropriate. No acute bone abnormality. No lytic or blastic bone lesion. IMPRESSION: 1. Interval development of a lobulated hypodense mass within the left hepatic dome measuring 5.3 x 3.1 cm and a second rounded hypodense lesion within segment 7 measuring 2.3 cm, indeterminate on this noncontrast examination. 2. Cholelithiasis without superimposed pericholecystic inflammatory change.  No intra or extrahepatic biliary ductal dilation. 3. Colonic diverticulosis without superimposed acute inflammatory change. 4. Moderate hiatal hernia. 5. Peripheral vascular disease. Particularly prominent atherosclerotic calcifications at the origin of the mesenteric vasculature. If there is clinical evidence of acute or chronic mesenteric ischemia, CT arteriography may be more helpful for further evaluation. Electronically signed by: Dorethia Molt MD 12/19/2023 08:05 PM EDT RP Workstation: [id number]   DG Chest 2 View Result Date: 12/19/2023 CLINICAL DATA:  Shortness of breath. EXAM: CHEST - 2 VIEW COMPARISON:  11/22/2022 FINDINGS: Borderline cardiomegaly. No pulmonary vascular congestion. Postsurgical changes of valve replacement are seen. Lungs are clear. IMPRESSION: Borderline cardiomegaly. Electronically Signed   By: Aliene Lloyd M.D.   On: 12/19/2023 17:05      LOS: 2 days   Gordy CHRISTELLA Starch, MD 12/21/2023, 3:13 PM See [facility], [facility], to contact our on call provider

## 2023-12-21 NOTE — Plan of Care (Signed)
  Problem: Coping: Goal: Ability to adjust to condition or change in health will improve Outcome: Progressing   Problem: Fluid Volume: Goal: Ability to maintain a balanced intake and output will improve Outcome: Progressing   Problem: Health Behavior/Discharge Planning: Goal: Ability to identify and utilize available resources and services will improve Outcome: Progressing   Problem: Nutritional: Goal: Maintenance of adequate nutrition will improve Outcome: Progressing   Problem: Education: Goal: Knowledge of General Education information will improve Description: Including pain rating scale, medication(s)/side effects and non-pharmacologic comfort measures Outcome: Progressing   Problem: Clinical Measurements: Goal: Diagnostic test results will improve Outcome: Progressing   Problem: Activity: Goal: Risk for activity intolerance will decrease Outcome: Progressing

## 2023-12-21 NOTE — Evaluation (Signed)
 Physical Therapy Brief Evaluation and Discharge Note Patient Details Name: Destiny Rice MRN: [id number] DOB: 12-18-1945 Today's Date: 12/21/2023   History of Present Illness  Patient is a 78 y.o.  female who presented with fatigue/shortness of breath-upon further evaluation-she was found to have worsening anemia and new liver mass. known history of chronic iron deficiency anemia, CKD stage IIIb, PAF on Eliquis , severe AS-s/p TAVR, DM-2  Clinical Impression  Pt presents with admitting diagnosis above. Pt today was able to ambulate in hallway independently with no AD. PTA pt was fully independent. Pt presents at or near baseline mobility. Pt has no further acute PT needs and will be signing off. Re consult PT if mobility status changes. Pt would benefit from continued mobility with mobility specialist during acute stay.        PT Assessment Patient does not need any further PT services  Assistance Needed at Discharge  PRN    Equipment Recommendations None recommended by PT  Recommendations for Other Services       Precautions/Restrictions Precautions Precautions: Fall Recall of Precautions/Restrictions: Intact Restrictions Weight Bearing Restrictions Per Provider Order: No        Mobility  Bed Mobility   Supine/Sidelying to sit: Independent Sit to supine/sidelying: Independent    Transfers Overall transfer level: Independent Equipment used: None                    Ambulation/Gait Ambulation/Gait assistance: Independent Gait Distance (Feet): 75 Feet Assistive device: None Gait Pattern/deviations: WFL(Within Functional Limits) Gait Speed: Pace WFL General Gait Details: slowed step through pattern however pt reports that this is baseline.  Home Activity Instructions    Stairs            Modified Rankin (Stroke Patients Only)        Balance Overall balance assessment: Independent                        Pertinent Vitals/Pain PT -  Brief Vital Signs All Vital Signs Stable: Yes Pain Assessment Pain Assessment: No/denies pain     Home Living Family/patient expects to be discharged to:: Private residence Living Arrangements: Spouse/significant other Available Help at Discharge: Family;Available 24 hours/day Home Environment: Stairs to enter  [employer] of Steps: 2 Home Equipment: Shower seat - built [employer] fundraiser (2 wheels);Cane - quad;Grab bars - tub/shower;Toilet riser;Hand held shower head        Prior Function Level of Independence: Independent      UE/LE Assessment   UE ROM/Strength/Tone/Coordination: WFL    LE ROM/Strength/Tone/Coordination: [facility]      Communication   Communication Communication: No apparent difficulties     Cognition Overall Cognitive Status: Appears within functional limits for tasks assessed/performed       General Comments General comments (skin integrity, edema, etc.): VSS    Exercises     Assessment/Plan    PT Problem List         PT Visit Diagnosis Other abnormalities of gait and mobility (R26.89)    No Skilled PT Patient at baseline level of functioning;Patient is independent with all acitivity/mobility   Co-evaluation                AMPAC 6 Clicks Help needed turning from your back to your side while in a flat bed without using bedrails?: None Help needed moving from lying on your back to sitting on the side of a flat bed without using bedrails?: None Help needed moving to  and from a bed to a chair (including a wheelchair)?: None Help needed standing up from a chair using your arms (e.g., wheelchair or bedside chair)?: None Help needed to walk in hospital room?: None Help needed climbing 3-5 steps with a railing? : A Little 6 Click Score: 23      End of Session Equipment Utilized During Treatment: Gait belt Activity Tolerance: Patient tolerated treatment well Patient left: in bed;with call bell/phone within reach Nurse Communication:  Mobility status PT Visit Diagnosis: Other abnormalities of gait and mobility (R26.89)     Time: 8672-8659 PT Time Calculation (min) (ACUTE ONLY): 13 min  Charges:   PT Evaluation $PT Eval Low Complexity: 1 Low      Destiny Rice, PT, DPT Acute Rehab Services [phone]   Destiny Rice  12/21/2023, 3:27 PM

## 2023-12-22 DIAGNOSIS — K7689 Other specified diseases of liver: Secondary | ICD-10-CM

## 2023-12-22 DIAGNOSIS — D509 Iron deficiency anemia, unspecified: Secondary | ICD-10-CM | POA: Diagnosis not present

## 2023-12-22 DIAGNOSIS — D649 Anemia, unspecified: Secondary | ICD-10-CM | POA: Diagnosis not present

## 2023-12-22 DIAGNOSIS — K449 Diaphragmatic hernia without obstruction or gangrene: Secondary | ICD-10-CM

## 2023-12-22 DIAGNOSIS — E038 Other specified hypothyroidism: Secondary | ICD-10-CM | POA: Diagnosis not present

## 2023-12-22 DIAGNOSIS — N1832 Chronic kidney disease, stage 3b: Secondary | ICD-10-CM | POA: Diagnosis not present

## 2023-12-22 DIAGNOSIS — R933 Abnormal findings on diagnostic imaging of other parts of digestive tract: Secondary | ICD-10-CM

## 2023-12-22 DIAGNOSIS — I160 Hypertensive urgency: Secondary | ICD-10-CM | POA: Diagnosis not present

## 2023-12-22 LAB — GLUCOSE, CAPILLARY
Glucose-Capillary: 102 mg/dL — ABNORMAL HIGH (ref 70–99)
Glucose-Capillary: 153 mg/dL — ABNORMAL HIGH (ref 70–99)
Glucose-Capillary: 123 mg/dL — ABNORMAL HIGH (ref 70–99)
Glucose-Capillary: 220 mg/dL — ABNORMAL HIGH (ref 70–99)

## 2023-12-22 LAB — CBC
HCT: 24.2 % — ABNORMAL LOW (ref 36.0–46.0)
Hemoglobin: 7.5 g/dL — ABNORMAL LOW (ref 12.0–15.0)
MCH: 23.6 pg — ABNORMAL LOW (ref 26.0–34.0)
MCHC: 31 g/dL (ref 30.0–36.0)
MCV: 76.1 fL — ABNORMAL LOW (ref 80.0–100.0)
Platelets: 245 K/uL (ref 150–400)
RBC: 3.18 MIL/uL — ABNORMAL LOW (ref 3.87–5.11)
RDW: 16.9 % — ABNORMAL HIGH (ref 11.5–15.5)
WBC: 5.8 K/uL (ref 4.0–10.5)
nRBC: 0 % (ref 0.0–0.2)

## 2023-12-22 LAB — BASIC METABOLIC PANEL WITH GFR
Anion gap: 9 (ref 5–15)
BUN: 27 mg/dL — ABNORMAL HIGH (ref 8–23)
CO2: 22 mmol/L (ref 22–32)
Calcium: 8.3 mg/dL — ABNORMAL LOW (ref 8.9–10.3)
Chloride: 107 mmol/L (ref 98–111)
Creatinine, Ser: 1.94 mg/dL — ABNORMAL HIGH (ref 0.44–1.00)
GFR, Estimated: 26 mL/min — ABNORMAL LOW (ref >60)
Glucose, Bld: 97 mg/dL (ref 70–99)
Potassium: 3.6 mmol/L (ref 3.5–5.1)
Sodium: 138 mmol/L (ref 135–145)

## 2023-12-22 LAB — PROTIME-INR
INR: 1.6 — ABNORMAL HIGH (ref 0.8–1.2)
Prothrombin Time: 19.7 s — ABNORMAL HIGH (ref 11.4–15.2)

## 2023-12-22 MED ORDER — SODIUM CHLORIDE 0.9 % IV SOLN
250.0000 mg | Freq: Once | INTRAVENOUS | Status: AC
  Administered 2023-12-22: 250 mg via INTRAVENOUS
  Filled 2023-12-22: qty 20

## 2023-12-22 NOTE — Plan of Care (Signed)
  Problem: Education: Goal: Ability to describe self-care measures that may prevent or decrease complications (Diabetes Survival Skills Education) will improve Outcome: Progressing   Problem: Coping: Goal: Ability to adjust to condition or change in health will improve Outcome: Progressing   Problem: Health Behavior/Discharge Planning: Goal: Ability to identify and utilize available resources and services will improve Outcome: Progressing   Problem: Metabolic: Goal: Ability to maintain appropriate glucose levels will improve Outcome: Progressing   Problem: Nutritional: Goal: Maintenance of adequate nutrition will improve Outcome: Progressing   Problem: Education: Goal: Knowledge of General Education information will improve Description: Including pain rating scale, medication(s)/side effects and non-pharmacologic comfort measures Outcome: Progressing   Problem: Activity: Goal: Risk for activity intolerance will decrease Outcome: Progressing   Problem: Coping: Goal: Level of anxiety will decrease Outcome: Progressing   Problem: Safety: Goal: Ability to remain free from injury will improve Outcome: Progressing

## 2023-12-22 NOTE — Plan of Care (Signed)

## 2023-12-22 NOTE — H&P (View-Only) (Signed)
 Patient ID: Destiny Rice, female   DOB: [birth year], 78 y.o.   MRN: [id number]    Progress Note   Subjective  Day # 2 CC; symptomatic anemia, history of chronic iron deficiency anemia, new finding of hepatic masses on noncontrasted CT  Patient is a [employer], declines blood products  MRI liver-  2 new liver lesions, T2 hyperintense evaluation somewhat limited by motion artifact, these are suboptimally characterized underlying malignant neoplasm cannot be excluded recommend liver protocol CT with and without contrast.  Study does mention that a branch of the hepatic vein is encased by the structure raising concern for malignancy.  Also noted sliding hiatal hernia with focal wall thickening nonspecific difficult to further characterize.  Patient had IV iron infusion  Labs today WBC 5.8/hemoglobin 7.5/hematocrit 24.2/MCV 76 stable Potassium 3.6/BUN 27/creatinine 1.94  Patient sitting up in the chair, has been visiting, she has no current complaints of abdominal pain or discomfort, MRI result was discussed  Patient relates that she had a severe reaction to IV contrast in the past, she also says she is unable to tolerate prednisone due to cardiac issues.    Objective   Vital signs in last 24 hours: Temp:  [97.6 F (36.4 C)-98.9 F (37.2 C)] 97.6 F (36.4 C) (09/28 1208) Pulse Rate:  [54-61] 57 (09/28 0753) Resp:  [12-19] 19 (09/28 1208) BP: (131-176)/(44-60) 164/54 (09/28 1208) SpO2:  [95 %] 95 % (09/28 0000) Last BM Date : 12/21/23 General:    Elderly [name] in NAD Heart:  Regular rate and rhythm; no murmurs Lungs: Respirations even and unlabored, lungs CTA bilaterally Abdomen:  Soft, obese, nontender and nondistended. Normal bowel sounds. Extremities:  Without edema. Neurologic:  Alert and oriented,  grossly normal neurologically. Psych:  Cooperative. Normal mood and affect.  Intake/Output from previous day: No intake/output data  recorded. Intake/Output this shift: No intake/output data recorded.  Lab Results: Recent Labs    12/20/23 0327 12/21/23 0219 12/22/23 0356  WBC 6.3 6.0 5.8  HGB 7.7* 7.2* 7.5*  HCT 24.6* 23.1* 24.2*  PLT 249 242 245   BMET Recent Labs    12/20/23 0327 12/21/23 0219 12/22/23 0356  NA 137 137 138  K 3.4* 3.7 3.6  CL 107 107 107  CO2 21* 22 22  GLUCOSE 166* 92 97  BUN 25* 24* 27*  CREATININE 1.82* 1.68* 1.94*  CALCIUM  8.5* 8.2* 8.3*   LFT Recent Labs    12/20/23 0327  PROT 6.0*  ALBUMIN 3.0*  AST 27  ALT 23  ALKPHOS 52  BILITOT 0.9   PT/INR No results for input(s): LABPROT, INR in the last 72 hours.  Studies/Results: MR LIVER W WO CONTRAST Result Date: 12/22/2023 EXAM: MRI ABDOMEN 12/21/2023 05:28:49 PM TECHNIQUE: Multiplanar multisequence MRI of the abdomen was performed without and with the administration of 10 mL gadobutrol (GADAVIST) 1 MMOL/ML injection. The exam detail is diminished due to motion artifact and patient body habitus. COMPARISON: None available. CLINICAL HISTORY: Liver lesion, > 1cm; Liver mass rule out HCC. Liver lesion, > 1cm;; 10 ml gadavist FINDINGS: LIVER: Within the dome of the left lobe of the liver (segment 4a), there is a lobulated mass measuring 4.2 x 3.2 x 2.5 cm (axial image 12/4). This is T2 hyperintense and T1 hypointense. The branch of the hepatic vein to segment 4a is encased by this structure, which is a feature of malignancy (axial image 12/4). Unfortunately, the post-contrast images for this lesion are suboptimal due to motion artifact and  timing of the contrast bolus. Additionally, reflux of contrast material into the hepatic veins diminishes the target to background ratio. Within the posterior right lobe of the liver, there is a second lesion which appears round and well-circumscribed, measuring 2.5 x 2.1 cm (image 10/4). This is also T2 hyperintense and T1 hypointense. Enhancement characteristics of this lesion are also difficult  to characterize due to motion artifact and suboptimal timing of the contrast bolus. There appears to be hypoenhancement within this structure on the delayed phase images, which is a nonspecific feature. Both of these lesions were not present on the examination from 03/10/2020. Additionally, both lesions show restricted diffusion concerning for malignancy. GALLBLADDER AND BILIARY SYSTEM: Gallbladder appears normal. No bile duct dilatation. SPLEEN: Unremarkable. PANCREAS: Unremarkable. ADRENAL GLANDS: Unremarkable. KIDNEYS: Bilateral simple-appearing kidney cysts. The largest is in the anterior cortex of the left kidney, measuring 3.6 cm (image 23/4). No follow-up imaging recommended. LYMPH NODES: No lymphadenopathy. VASCULATURE: Aortic atherosclerotic calcification. PERITONEUM: No free fluid or fluid collections. BOWEL: Moderate-sized hiatal hernia. There is focal wall thickening within the sliding hiatal hernia image 17/3, which is suboptimally evaluated on the current exam due to motion. No pathologic dilatation of the bowel loops. ABDOMINAL WALL: No acute abnormality. BONES: No acute abnormality. IMPRESSION: 1. Two new liver lesions (left lobe 4.2 x 3.2 x 2.5 cm; posterior right lobe 2.5 x 2.1 cm) that are T2 hyperintense, T1 hypointense, and show restricted diffusion. Evaluation is limited by motion artifact, patient body habitus and suboptimal contrast timing. These remain suboptimally characterized, and an underlying malignant neoplasm cannot be excluded. Recommend more definitive characterization with dedicated liver protocol CT without and with contrast material. 2. Gallstones. 3. Sliding-type hiatal hernia as noted on the CT from 12/19/2022. There is focal wall thickening in this area, which is nonspecific and difficult to further characterize due to motion artifact and suboptimal imaging post-contrast images. This can also be readdressed at the time of the contrast-enhanced liver protocol CT. Electronically  signed by: Waddell Calk MD 12/22/2023 06:08 AM EDT RP Workstation: [id number]       Assessment / Plan:    #43 78 year old African-American female with previous history of longstanding iron deficiency anemia by her report with previous workup in [place] .  Prior IV iron infusions though none over the past 4 years.  Remote endoscopic evaluation/last colonoscopy probably 10 years ago she seems uncertain about EGD  presenting with symptomatic anemia currently Hemoccult negative and markedly iron deficient.  Hemoglobin has been stable here, she has received an IV iron infusion  #2  2 new liver masses noted on noncontrasted CT on admission MRI done earlier this morning as above, lesions very concerning for malignant process but not well-delineated and radiology suggested a contrasted CT MRI also mentions sliding hiatal hernia and distal esophageal wall thickening  Patient is not able to have IV contrast and unwilling to take prednisone.  Will check tumor markers, and ask IR to see her regarding possible biopsy  #2 history of atrial fibrillation-on chronic Eliquis  currently on hold #3 aortic stenosis status post TAVR 4 coronary artery disease status post stents #5 chronic kidney disease stage IIIb #6 Jehovah's Witness #7 diabetes mellitus  Plan; CEA/CA 19-9/AFP IR consult for possible liver biopsy Will make n.p.o. after midnight, and schedule patient for EGD with Dr. San for tomorrow 12/23/2023 to further evaluate esophageal thickening noted on CT. GI will continue to follow with you      Principal Problem:   Symptomatic anemia Active Problems:  Hypertensive urgency   Paroxysmal atrial fibrillation (HCC)   CKD stage 3b, GFR 30-44 ml/min (HCC)   Non-insulin  dependent type 2 diabetes mellitus (HCC)   Hypothyroidism   History of aortic stenosis s/p TAVR   History of CAD (coronary artery disease)   Hyperlipidemia   Peripheral artery disease   Liver lesion   Iron  deficiency anemia     LOS: 3 days   Josef Tourigny EsterwoodPA-C  12/22/2023, 12:56 PM

## 2023-12-22 NOTE — Progress Notes (Signed)
 PROGRESS NOTE        PATIENT DETAILS Name: Destiny Rice Age: 78 y.o. Sex: female Date of Birth: 21-Mar-1946 Admit Date: 12/19/2023 Admitting Physician Micaela Speaker, MD ERE:Hjcnlm, Camellia, MD  Brief Summary: Patient is a 78 y.o.  female with known history of chronic iron deficiency anemia, CKD stage IIIb, PAF on Eliquis , severe AS-s/p TAVR, DM-2 who presented with fatigue/shortness of breath-upon further evaluation-she was found to have worsening anemia and new liver mass.  Significant events: 9/25>> admit to [facility]  Significant studies: 9/25>> CXR: No PNA 9/25>> CT abdomen/pelvis: Hypodense mass in the left hepatic dome 5.3 x 3.1 cm, second rounded hypodense lesion within segment 7 measuring 2.3 cm. 9/26>> echo: EF 60-65%, moderate to severe mitral stenosis-prosthetic annuloplasty ring present, no aortic stenosis-TAVR valve present. 9/27>> MRI liver: 2 new liver lesions-due to suboptimal contrast timing/body habitus/motion artifact-underlying malignant neoplasm cannot be excluded.  Significant microbiology data: None  Procedures: None  Consults: GI  Subjective: No issues overnight-lying comfortably in bed.  Objective: Vitals: Blood pressure (!) 176/60, pulse (!) 57, temperature 98.5 F (36.9 C), temperature source Oral, resp. rate 16, height 5' 5 (1.651 m), weight 106.6 kg, SpO2 95%.   Exam: Gen Exam:Alert awake-not in any distress HEENT:atraumatic, normocephalic Chest: B/L clear to auscultation anteriorly CVS:S1S2 regular Abdomen:soft non tender, non distended Extremities:no edema Neurology: Non focal Skin: no rash  Pertinent Labs/Radiology:    Latest Ref Rng & Units 12/22/2023    3:56 AM 12/21/2023    2:19 AM 12/20/2023    3:27 AM  CBC  WBC 4.0 - 10.5 K/uL 5.8  6.0  6.3   Hemoglobin 12.0 - 15.0 g/dL 7.5  7.2  7.7   Hematocrit 36.0 - 46.0 % 24.2  23.1  24.6   Platelets 150 - 400 K/uL 245  242  249     Lab Results  Component Value  Date   NA 138 12/22/2023   K 3.6 12/22/2023   CL 107 12/22/2023   CO2 22 12/22/2023      Assessment/Plan: Fatigue/exertional dyspnea-likely secondary to symptomatic microcytic-iron deficiency anemia No obvious blood loss-brown stools-FOBT negative on 9/25 This is a longstanding issue-on oral supplements at home-has had IV iron as an outpatient Per GI note-patient not keen on pursuing EGD/colonoscopy-prior endoscopy evaluations have been unremarkable She is a [employer] does not want any blood products.   Hb relatively stable-follow CBC Will discuss with pharmacy to see if she can get another dose of IV iron prior to her discharge.  Newly diagnosed liver mass Concern for malignancy Unfortunately-MRI liver not diagnostic-await GI input  May end up needing a liver biopsy-however her hemoglobin is 7.5-with her being a [employer] not wanting blood products-difficult situation in case she does have bleeding complications.  AKI on CKD stage IIIb AKI likely hemodynamically mediated-managed with supportive care-creatinine improved and back to baseline.  HTN BP on the high side this morning-hopefully once she gets her antihypertensives-this will improve Continue  Coreg , hydralazine , Imdur , Avapro /HCTZ-follow/optimize.  DM-2 (A1c 6.8 on 9/26) CBG stable SSI  Recent Labs    12/21/23 1554 12/21/23 2146 12/22/23 0756  GLUCAP 103* 149* 102*     PAF Telemetry monitoring Coreg /amiodarone  Remains on Eliquis .  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  Hypothyroidism Synthroid   Class 2 Obesity: Estimated body mass index is 39.11 kg/m as calculated from the following:   Height as of this encounter: 5' 5 (1.651 m).   Weight as of this encounter: 106.6 kg.    Code status:   Code Status: Full Code   DVT Prophylaxis: SCDs Start: 12/19/23 2341 Place TED hose Start: 12/19/23 2341 apixaban  (ELIQUIS ) tablet 5 mg     Family Communication: None at bedside   Disposition Plan: Status is: Inpatient Remains inpatient appropriate because: Severity of illness   Planned Discharge Destination:Home   Diet: Diet Order             Diet heart healthy/carb modified Room service appropriate? Yes; Fluid consistency: Thin  Diet effective now                     Antimicrobial agents: Anti-infectives (From admission, onward)    None        MEDICATIONS: Scheduled Meds:  amiodarone   200 mg Oral Daily   apixaban   5 mg Oral BID   atorvastatin   20 mg Oral Daily   carvedilol   25 mg Oral BID WC   ferrous sulfate   325 mg Oral Q breakfast   folic acid   500 mcg Oral Daily   gabapentin   100 mg Oral TID   hydrALAZINE   50 mg Oral BID   irbesartan   150 mg Oral Daily   And   hydrochlorothiazide   25 mg Oral Daily   isosorbide  mononitrate  30 mg Oral Daily   levothyroxine   25 mcg Oral Q0600   pantoprazole   40 mg Oral BID AC   polyethylene glycol  17 g Oral Daily   sodium chloride  flush  3 mL Intravenous Q12H   sodium chloride  flush  3 mL Intravenous Q12H   Continuous Infusions: PRN Meds:.acetaminophen  **OR** acetaminophen , ALPRAZolam , alum & mag hydroxide-simeth, hydrALAZINE , insulin  aspart, meclizine , metoCLOPramide  (REGLAN ) injection, sodium chloride  flush   I have personally reviewed following labs and imaging studies  LABORATORY DATA: CBC: Recent Labs  Lab 12/19/23 1612 12/20/23 0327 12/21/23 0219 12/22/23 0356  WBC 6.4 6.3 6.0 5.8  NEUTROABS 4.9  --   --   --   HGB 8.0* 7.7* 7.2* 7.5*  HCT 27.1* 24.6* 23.1* 24.2*  MCV 78.3* 76.9* 76.7* 76.1*  PLT 290 249 242 245    Basic Metabolic Panel: Recent Labs  Lab 12/19/23 1612 12/20/23 0327 12/21/23 0219 12/22/23 0356  NA 137 137 137 138  K 4.1 3.4* 3.7 3.6  CL 103 107  107 107  CO2 22 21* 22 22  GLUCOSE 121* 166* 92 97  BUN 27* 25* 24* 27*  CREATININE 2.00* 1.82* 1.68* 1.94*  CALCIUM  8.6* 8.5* 8.2* 8.3*    GFR: Estimated Creatinine Clearance: 28.5 mL/min (A) (by C-G formula based on SCr of 1.94 mg/dL (H)).  Liver Function Tests: Recent Labs  Lab 12/19/23 1612 12/20/23 0327  AST 31 27  ALT 28 23  ALKPHOS 57 52  BILITOT 0.6 0.9  PROT 6.6 6.0*  ALBUMIN 3.3* 3.0*   No results for input(s): LIPASE, AMYLASE in the last 168 hours. No results for input(s): AMMONIA in the last 168 hours.  Coagulation Profile: No results for input(s): INR, PROTIME in the last 168 hours.  Cardiac Enzymes: No results for input(s): CKTOTAL, CKMB, CKMBINDEX, TROPONINI in the last 168 hours.  BNP (last 3 results) No results for input(s): PROBNP in the  last 8760 hours.  Lipid Profile: No results for input(s): CHOL, HDL, LDLCALC, TRIG, CHOLHDL, LDLDIRECT in the last 72 hours.  Thyroid  Function Tests: No results for input(s): TSH, T4TOTAL, FREET4, T3FREE, THYROIDAB in the last 72 hours.  Anemia Panel: Recent Labs    12/20/23 0327 12/20/23 0534  VITAMINB12  --  2,005*  FOLATE >20.0  --   FERRITIN  --  16  TIBC  --  382  IRON  --  30  RETICCTPCT 1.0  --     Urine analysis:    Component Value Date/Time   COLORURINE YELLOW 03/10/2020 0330   APPEARANCEUR CLEAR 03/10/2020 0330   LABSPEC 1.012 03/10/2020 0330   PHURINE 6.0 03/10/2020 0330   GLUCOSEU NEGATIVE 03/10/2020 0330   HGBUR NEGATIVE 03/10/2020 0330   BILIRUBINUR NEGATIVE 03/10/2020 0330   KETONESUR NEGATIVE 03/10/2020 0330   PROTEINUR NEGATIVE 03/10/2020 0330   NITRITE NEGATIVE 03/10/2020 0330   LEUKOCYTESUR NEGATIVE 03/10/2020 0330    Sepsis Labs: Lactic Acid, Venous No results found for: LATICACIDVEN  MICROBIOLOGY: No results found for this or any previous visit (from the past 240 hours).  RADIOLOGY STUDIES/RESULTS: MR LIVER W WO  CONTRAST Result Date: 12/22/2023 EXAM: MRI ABDOMEN 12/21/2023 05:28:49 PM TECHNIQUE: Multiplanar multisequence MRI of the abdomen was performed without and with the administration of 10 mL gadobutrol (GADAVIST) 1 MMOL/ML injection. The exam detail is diminished due to motion artifact and patient body habitus. COMPARISON: None available. CLINICAL HISTORY: Liver lesion, > 1cm; Liver mass rule out HCC. Liver lesion, > 1cm;; 10 ml gadavist FINDINGS: LIVER: Within the dome of the left lobe of the liver (segment 4a), there is a lobulated mass measuring 4.2 x 3.2 x 2.5 cm (axial image 12/4). This is T2 hyperintense and T1 hypointense. The branch of the hepatic vein to segment 4a is encased by this structure, which is a feature of malignancy (axial image 12/4). Unfortunately, the post-contrast images for this lesion are suboptimal due to motion artifact and timing of the contrast bolus. Additionally, reflux of contrast material into the hepatic veins diminishes the target to background ratio. Within the posterior right lobe of the liver, there is a second lesion which appears round and well-circumscribed, measuring 2.5 x 2.1 cm (image 10/4). This is also T2 hyperintense and T1 hypointense. Enhancement characteristics of this lesion are also difficult to characterize due to motion artifact and suboptimal timing of the contrast bolus. There appears to be hypoenhancement within this structure on the delayed phase images, which is a nonspecific feature. Both of these lesions were not present on the examination from 03/10/2020. Additionally, both lesions show restricted diffusion concerning for malignancy. GALLBLADDER AND BILIARY SYSTEM: Gallbladder appears normal. No bile duct dilatation. SPLEEN: Unremarkable. PANCREAS: Unremarkable. ADRENAL GLANDS: Unremarkable. KIDNEYS: Bilateral simple-appearing kidney cysts. The largest is in the anterior cortex of the left kidney, measuring 3.6 cm (image 23/4). No follow-up imaging  recommended. LYMPH NODES: No lymphadenopathy. VASCULATURE: Aortic atherosclerotic calcification. PERITONEUM: No free fluid or fluid collections. BOWEL: Moderate-sized hiatal hernia. There is focal wall thickening within the sliding hiatal hernia image 17/3, which is suboptimally evaluated on the current exam due to motion. No pathologic dilatation of the bowel loops. ABDOMINAL WALL: No acute abnormality. BONES: No acute abnormality. IMPRESSION: 1. Two new liver lesions (left lobe 4.2 x 3.2 x 2.5 cm; posterior right lobe 2.5 x 2.1 cm) that are T2 hyperintense, T1 hypointense, and show restricted diffusion. Evaluation is limited by motion artifact, patient body habitus and suboptimal contrast timing. These  remain suboptimally characterized, and an underlying malignant neoplasm cannot be excluded. Recommend more definitive characterization with dedicated liver protocol CT without and with contrast material. 2. Gallstones. 3. Sliding-type hiatal hernia as noted on the CT from 12/19/2022. There is focal wall thickening in this area, which is nonspecific and difficult to further characterize due to motion artifact and suboptimal imaging post-contrast images. This can also be readdressed at the time of the contrast-enhanced liver protocol CT. Electronically signed by: Waddell Calk MD 12/22/2023 06:08 AM EDT RP Workstation: [id number]     LOS: 3 days   Donalda Applebaum, MD  [facility]    To contact the attending provider between 7A-7P or the covering provider during after hours 7P-7A, please log into the web site [email or web address] and access using universal [place] password for that web site. If you do not have the password, please call the hospital operator.  12/22/2023, 9:33 AM

## 2023-12-22 NOTE — Progress Notes (Signed)
 Patient ID: Destiny Rice, female   DOB: [birth year], 78 y.o.   MRN: [id number]    Progress Note   Subjective  Day # 2 CC; symptomatic anemia, history of chronic iron deficiency anemia, new finding of hepatic masses on noncontrasted CT  Patient is a Jehovah's Witness, declines blood products  MRI liver-  2 new liver lesions, T2 hyperintense evaluation somewhat limited by motion artifact, these are suboptimally characterized underlying malignant neoplasm cannot be excluded recommend liver protocol CT with and without contrast.  Study does mention that a branch of the hepatic vein is encased by the structure raising concern for malignancy.  Also noted sliding hiatal hernia with focal wall thickening nonspecific difficult to further characterize.  Patient had IV iron infusion  Labs today WBC 5.8/hemoglobin 7.5/hematocrit 24.2/MCV 76 stable Potassium 3.6/BUN 27/creatinine 1.94  Patient sitting up in the chair, has been visiting, she has no current complaints of abdominal pain or discomfort, MRI result was discussed  Patient relates that she had a severe reaction to IV contrast in the past, she also says she is unable to tolerate prednisone due to cardiac issues.    Objective   Vital signs in last 24 hours: Temp:  [97.6 F (36.4 C)-98.9 F (37.2 C)] 97.6 F (36.4 C) (09/28 1208) Pulse Rate:  [54-61] 57 (09/28 0753) Resp:  [12-19] 19 (09/28 1208) BP: (131-176)/(44-60) 164/54 (09/28 1208) SpO2:  [95 %] 95 % (09/28 0000) Last BM Date : 12/21/23 General:    Elderly [name] in NAD Heart:  Regular rate and rhythm; no murmurs Lungs: Respirations even and unlabored, lungs CTA bilaterally Abdomen:  Soft, obese, nontender and nondistended. Normal bowel sounds. Extremities:  Without edema. Neurologic:  Alert and oriented,  grossly normal neurologically. Psych:  Cooperative. Normal mood and affect.  Intake/Output from previous day: No intake/output data  recorded. Intake/Output this shift: No intake/output data recorded.  Lab Results: Recent Labs    12/20/23 0327 12/21/23 0219 12/22/23 0356  WBC 6.3 6.0 5.8  HGB 7.7* 7.2* 7.5*  HCT 24.6* 23.1* 24.2*  PLT 249 242 245   BMET Recent Labs    12/20/23 0327 12/21/23 0219 12/22/23 0356  NA 137 137 138  K 3.4* 3.7 3.6  CL 107 107 107  CO2 21* 22 22  GLUCOSE 166* 92 97  BUN 25* 24* 27*  CREATININE 1.82* 1.68* 1.94*  CALCIUM  8.5* 8.2* 8.3*   LFT Recent Labs    12/20/23 0327  PROT 6.0*  ALBUMIN 3.0*  AST 27  ALT 23  ALKPHOS 52  BILITOT 0.9   PT/INR No results for input(s): LABPROT, INR in the last 72 hours.  Studies/Results: MR LIVER W WO CONTRAST Result Date: 12/22/2023 EXAM: MRI ABDOMEN 12/21/2023 05:28:49 PM TECHNIQUE: Multiplanar multisequence MRI of the abdomen was performed without and with the administration of 10 mL gadobutrol (GADAVIST) 1 MMOL/ML injection. The exam detail is diminished due to motion artifact and patient body habitus. COMPARISON: None available. CLINICAL HISTORY: Liver lesion, > 1cm; Liver mass rule out HCC. Liver lesion, > 1cm;; 10 ml gadavist FINDINGS: LIVER: Within the dome of the left lobe of the liver (segment 4a), there is a lobulated mass measuring 4.2 x 3.2 x 2.5 cm (axial image 12/4). This is T2 hyperintense and T1 hypointense. The branch of the hepatic vein to segment 4a is encased by this structure, which is a feature of malignancy (axial image 12/4). Unfortunately, the post-contrast images for this lesion are suboptimal due to motion artifact and  timing of the contrast bolus. Additionally, reflux of contrast material into the hepatic veins diminishes the target to background ratio. Within the posterior right lobe of the liver, there is a second lesion which appears round and well-circumscribed, measuring 2.5 x 2.1 cm (image 10/4). This is also T2 hyperintense and T1 hypointense. Enhancement characteristics of this lesion are also difficult  to characterize due to motion artifact and suboptimal timing of the contrast bolus. There appears to be hypoenhancement within this structure on the delayed phase images, which is a nonspecific feature. Both of these lesions were not present on the examination from 03/10/2020. Additionally, both lesions show restricted diffusion concerning for malignancy. GALLBLADDER AND BILIARY SYSTEM: Gallbladder appears normal. No bile duct dilatation. SPLEEN: Unremarkable. PANCREAS: Unremarkable. ADRENAL GLANDS: Unremarkable. KIDNEYS: Bilateral simple-appearing kidney cysts. The largest is in the anterior cortex of the left kidney, measuring 3.6 cm (image 23/4). No follow-up imaging recommended. LYMPH NODES: No lymphadenopathy. VASCULATURE: Aortic atherosclerotic calcification. PERITONEUM: No free fluid or fluid collections. BOWEL: Moderate-sized hiatal hernia. There is focal wall thickening within the sliding hiatal hernia image 17/3, which is suboptimally evaluated on the current exam due to motion. No pathologic dilatation of the bowel loops. ABDOMINAL WALL: No acute abnormality. BONES: No acute abnormality. IMPRESSION: 1. Two new liver lesions (left lobe 4.2 x 3.2 x 2.5 cm; posterior right lobe 2.5 x 2.1 cm) that are T2 hyperintense, T1 hypointense, and show restricted diffusion. Evaluation is limited by motion artifact, patient body habitus and suboptimal contrast timing. These remain suboptimally characterized, and an underlying malignant neoplasm cannot be excluded. Recommend more definitive characterization with dedicated liver protocol CT without and with contrast material. 2. Gallstones. 3. Sliding-type hiatal hernia as noted on the CT from 12/19/2022. There is focal wall thickening in this area, which is nonspecific and difficult to further characterize due to motion artifact and suboptimal imaging post-contrast images. This can also be readdressed at the time of the contrast-enhanced liver protocol CT. Electronically  signed by: Waddell Calk MD 12/22/2023 06:08 AM EDT RP Workstation: [id number]       Assessment / Plan:    #43 78 year old African-American female with previous history of longstanding iron deficiency anemia by her report with previous workup in [place] .  Prior IV iron infusions though none over the past 4 years.  Remote endoscopic evaluation/last colonoscopy probably 10 years ago she seems uncertain about EGD  presenting with symptomatic anemia currently Hemoccult negative and markedly iron deficient.  Hemoglobin has been stable here, she has received an IV iron infusion  #2  2 new liver masses noted on noncontrasted CT on admission MRI done earlier this morning as above, lesions very concerning for malignant process but not well-delineated and radiology suggested a contrasted CT MRI also mentions sliding hiatal hernia and distal esophageal wall thickening  Patient is not able to have IV contrast and unwilling to take prednisone.  Will check tumor markers, and ask IR to see her regarding possible biopsy  #2 history of atrial fibrillation-on chronic Eliquis  currently on hold #3 aortic stenosis status post TAVR 4 coronary artery disease status post stents #5 chronic kidney disease stage IIIb #6 Jehovah's Witness #7 diabetes mellitus  Plan; CEA/CA 19-9/AFP IR consult for possible liver biopsy Will make n.p.o. after midnight, and schedule patient for EGD with Dr. San for tomorrow 12/23/2023 to further evaluate esophageal thickening noted on CT. GI will continue to follow with you      Principal Problem:   Symptomatic anemia Active Problems:  Hypertensive urgency   Paroxysmal atrial fibrillation (HCC)   CKD stage 3b, GFR 30-44 ml/min (HCC)   Non-insulin  dependent type 2 diabetes mellitus (HCC)   Hypothyroidism   History of aortic stenosis s/p TAVR   History of CAD (coronary artery disease)   Hyperlipidemia   Peripheral artery disease   Liver lesion   Iron  deficiency anemia     LOS: 3 days   Josef Tourigny EsterwoodPA-C  12/22/2023, 12:56 PM

## 2023-12-22 NOTE — Evaluation (Signed)
 Occupational Therapy Evaluation and Discharge Patient Details Name: Destiny Rice MRN: [id number] DOB: 1945-11-26 Today's Date: 12/22/2023   History of Present Illness   Patient is a 78 y.o.  female who presented with fatigue/shortness of breath-upon further evaluation-she was found to have worsening anemia and new liver mass. known history of chronic iron deficiency anemia, CKD stage IIIb, PAF on Eliquis , severe AS-s/p TAVR, DM-2     Clinical Impressions At baseline, pt is Ind with ADLs, IADLs, and drives, and completes functional mobility Ind to Mod I with intermittent use of SPC or RW. Pt now presents at or within 95% of baseline PLOF with pt demonstrating ability to complete all ADLs Ind to Mod I (with mildly increased time) and functional mobility/transfers. Ind without an AD. Pt reports she feels she is now at or very near baseline PLOF. No further benefit from acute OT services at this time and no post acute skilled OT needs are anticipated. OT is signing off at this time.      If plan is discharge home, recommend the following:   Assistance with cooking/housework;Assist for transportation;Help with stairs or ramp for entrance (PRN assist)     Functional Status Assessment   Patient has not had a recent decline in their functional status     Equipment Recommendations   None recommended by OT     Recommendations for Other Services         Precautions/Restrictions   Precautions Precautions: Fall Recall of Precautions/Restrictions: Intact Restrictions Weight Bearing Restrictions Per Provider Order: No     Mobility Bed Mobility               General bed mobility comments: Pt sitting in recliner at beginning and end of session    Transfers Overall transfer level: Independent Equipment used: None                      Balance                                           ADL either performed or assessed with clinical  judgement   ADL Overall ADL's : Modified independent;Independent;At baseline                                       General ADL Comments: Pt demonstrates ability to complete all ADLs Ind to Mod I and reports she feels she is now at or very near baseline PLOF.     Vision Baseline Vision/History: 1 Wears glasses Ability to See in Adequate Light: 0 Adequate (with glasses) Patient Visual Report: No change from baseline Vision Assessment?: No apparent visual deficits (with glasses on)     Perception         Praxis         Pertinent Vitals/Pain Pain Assessment Pain Assessment: No/denies pain     Extremity/Trunk Assessment Upper Extremity Assessment Upper Extremity Assessment: Right hand dominant;Overall WFL for tasks assessed;LUE deficits/detail LUE Deficits / Details: overall WFL; mildy edematous around elbow and into forearm with pt reporting swelling started after several failed attempts upon admission to place IV in this area with pt also reporting swelling has gone down over the past day   Lower Extremity Assessment Lower Extremity Assessment: Defer to PT evaluation  Communication Communication Communication: No apparent difficulties   Cognition Arousal: Alert Behavior During Therapy: WFL for tasks assessed/performed Cognition: No apparent impairments             OT - Cognition Comments: Pt AAOx4 and pleasant throughout session. Pt cognition WFL for tasks assessed; not formally screened or evaluated                 Following commands: Intact       Cueing  General Comments   Cueing Techniques: Verbal cues  VSS on RA   Exercises     Shoulder Instructions      Home Living Family/patient expects to be discharged to:: Private residence Living Arrangements: Spouse/significant other Available Help at Discharge: Family;Available 24 hours/day Type of Home: House ([place]) Home Access: Stairs to enter [employer] of  Steps: 2 Entrance Stairs-Rails: None Home Layout: One level     Bathroom Shower/Tub: Producer, television/film/video: Handicapped height     Home Equipment: Shower seat - built [employer] fundraiser (2 wheels);Cane - quad;Grab bars - tub/shower;Toilet riser;Hand held shower head          Prior Functioning/Environment Prior Level of Function : Independent/Modified Independent;Driving             Mobility Comments: Ind to Mod I with intermittent use of SPC or RW ADLs Comments: Ind with ADLs and IADLs; drives; enjoys traveling    OT Problem List:     OT Treatment/Interventions:        OT Goals(Current goals can be found in the care plan section)   Acute Rehab OT Goals Patient Stated Goal: to return home and continue to feel better OT Goal Formulation: All assessment and education complete, DC therapy   OT Frequency:       Co-evaluation              AM-PAC OT 6 Clicks Daily Activity     Outcome Measure Help from another person eating meals?: None Help from another person taking care of personal grooming?: None Help from another person toileting, which includes using toliet, bedpan, or urinal?: None Help from another person bathing (including washing, rinsing, drying)?: None Help from another person to put on and taking off regular upper body clothing?: None Help from another person to put on and taking off regular lower body clothing?: None 6 Click Score: 24   End of Session Nurse Communication: Mobility status;Other (comment) (OT signing off)  Activity Tolerance: Patient tolerated treatment well Patient left: in chair;with call bell/phone within reach  OT Visit Diagnosis: Other (comment) (decreased activity tolerance)                Time: 9040-8984 OT Time Calculation (min): 16 min Charges:  OT General Charges $OT Visit: 1 Visit OT Evaluation $OT Eval Low Complexity: 1 Low  Margarie Rockey HERO., OTR/L, MA Acute Rehab [phone]   Margarie FORBES Horns 12/22/2023, 10:41 AM

## 2023-12-23 ENCOUNTER — Encounter (HOSPITAL_COMMUNITY): Payer: Self-pay | Admitting: Internal Medicine

## 2023-12-23 ENCOUNTER — Inpatient Hospital Stay (HOSPITAL_COMMUNITY): Admitting: Anesthesiology

## 2023-12-23 ENCOUNTER — Encounter (HOSPITAL_COMMUNITY): Admission: EM | Disposition: A | Payer: Self-pay | Source: Home / Self Care | Attending: Internal Medicine

## 2023-12-23 DIAGNOSIS — I480 Paroxysmal atrial fibrillation: Secondary | ICD-10-CM

## 2023-12-23 DIAGNOSIS — K7689 Other specified diseases of liver: Secondary | ICD-10-CM | POA: Diagnosis not present

## 2023-12-23 DIAGNOSIS — N1832 Chronic kidney disease, stage 3b: Secondary | ICD-10-CM | POA: Diagnosis not present

## 2023-12-23 DIAGNOSIS — K317 Polyp of stomach and duodenum: Secondary | ICD-10-CM

## 2023-12-23 DIAGNOSIS — K2970 Gastritis, unspecified, without bleeding: Secondary | ICD-10-CM

## 2023-12-23 DIAGNOSIS — I10 Essential (primary) hypertension: Secondary | ICD-10-CM

## 2023-12-23 DIAGNOSIS — I2510 Atherosclerotic heart disease of native coronary artery without angina pectoris: Secondary | ICD-10-CM

## 2023-12-23 DIAGNOSIS — E038 Other specified hypothyroidism: Secondary | ICD-10-CM | POA: Diagnosis not present

## 2023-12-23 DIAGNOSIS — K2960 Other gastritis without bleeding: Secondary | ICD-10-CM

## 2023-12-23 DIAGNOSIS — K222 Esophageal obstruction: Secondary | ICD-10-CM

## 2023-12-23 DIAGNOSIS — I160 Hypertensive urgency: Secondary | ICD-10-CM | POA: Diagnosis not present

## 2023-12-23 DIAGNOSIS — D649 Anemia, unspecified: Secondary | ICD-10-CM | POA: Diagnosis not present

## 2023-12-23 HISTORY — PX: ESOPHAGOGASTRODUODENOSCOPY: SHX5428

## 2023-12-23 LAB — BASIC METABOLIC PANEL WITH GFR
Anion gap: 9 (ref 5–15)
BUN: 31 mg/dL — ABNORMAL HIGH (ref 8–23)
CO2: 21 mmol/L — ABNORMAL LOW (ref 22–32)
Calcium: 8.2 mg/dL — ABNORMAL LOW (ref 8.9–10.3)
Chloride: 107 mmol/L (ref 98–111)
Creatinine, Ser: 2.21 mg/dL — ABNORMAL HIGH (ref 0.44–1.00)
GFR, Estimated: 22 mL/min — ABNORMAL LOW (ref >60)
Glucose, Bld: 108 mg/dL — ABNORMAL HIGH (ref 70–99)
Potassium: 3.6 mmol/L (ref 3.5–5.1)
Sodium: 137 mmol/L (ref 135–145)

## 2023-12-23 LAB — CBC
HCT: 24.1 % — ABNORMAL LOW (ref 36.0–46.0)
Hemoglobin: 7.6 g/dL — ABNORMAL LOW (ref 12.0–15.0)
MCH: 24.5 pg — ABNORMAL LOW (ref 26.0–34.0)
MCHC: 31.5 g/dL (ref 30.0–36.0)
MCV: 77.7 fL — ABNORMAL LOW (ref 80.0–100.0)
Platelets: 246 K/uL (ref 150–400)
RBC: 3.1 MIL/uL — ABNORMAL LOW (ref 3.87–5.11)
RDW: 17.1 % — ABNORMAL HIGH (ref 11.5–15.5)
WBC: 6.8 K/uL (ref 4.0–10.5)
nRBC: 0 % (ref 0.0–0.2)

## 2023-12-23 LAB — GLUCOSE, CAPILLARY
Glucose-Capillary: 124 mg/dL — ABNORMAL HIGH (ref 70–99)
Glucose-Capillary: 212 mg/dL — ABNORMAL HIGH (ref 70–99)
Glucose-Capillary: 126 mg/dL — ABNORMAL HIGH (ref 70–99)
Glucose-Capillary: 159 mg/dL — ABNORMAL HIGH (ref 70–99)

## 2023-12-23 SURGERY — EGD (ESOPHAGOGASTRODUODENOSCOPY)
Anesthesia: Monitor Anesthesia Care

## 2023-12-23 MED ORDER — INSULIN ASPART 100 UNIT/ML IJ SOLN: 0.0000 [IU] | Freq: Three times a day (TID) | INTRAMUSCULAR | Status: DC

## 2023-12-23 MED ORDER — HYOSCYAMINE SULFATE 0.125 MG SL SUBL
0.1250 mg | SUBLINGUAL_TABLET | Freq: Once | SUBLINGUAL | Status: DC
  Filled 2023-12-23: qty 1

## 2023-12-23 MED ORDER — HYOSCYAMINE SULFATE 0.125 MG PO TBDP
0.1250 mg | ORAL_TABLET | Freq: Once | ORAL | Status: AC
  Administered 2023-12-23: 0.125 mg via SUBLINGUAL
  Filled 2023-12-23: qty 1

## 2023-12-23 MED ORDER — SODIUM CHLORIDE 0.9 % IV SOLN
INTRAVENOUS | Status: AC | PRN
Start: 2023-12-23 — End: 2023-12-23
  Administered 2023-12-23: 500 mL via INTRAVENOUS

## 2023-12-23 MED ORDER — PROPOFOL 500 MG/50ML IV EMUL
INTRAVENOUS | Status: DC | PRN
  Administered 2023-12-23: 75 ug/kg/min via INTRAVENOUS

## 2023-12-23 MED ORDER — LIDOCAINE HCL (CARDIAC) PF 100 MG/5ML IV SOSY
PREFILLED_SYRINGE | INTRAVENOUS | Status: DC | PRN
  Administered 2023-12-23: 100 mg via INTRATRACHEAL

## 2023-12-23 MED ORDER — PROPOFOL 10 MG/ML IV BOLUS
INTRAVENOUS | Status: DC | PRN
  Administered 2023-12-23: 20 mg via INTRAVENOUS
  Administered 2023-12-23: 40 mg via INTRAVENOUS

## 2023-12-23 MED ORDER — AMLODIPINE BESYLATE 5 MG PO TABS
5.0000 mg | ORAL_TABLET | Freq: Every day | ORAL | Status: DC
  Filled 2023-12-23: qty 1

## 2023-12-23 MED ORDER — HYDRALAZINE HCL 20 MG/ML IJ SOLN
5.0000 mg | Freq: Once | INTRAMUSCULAR | Status: AC
  Administered 2023-12-23: 5 mg via INTRAVENOUS
  Filled 2023-12-23: qty 1

## 2023-12-23 MED ORDER — LABETALOL HCL 5 MG/ML IV SOLN
10.0000 mg | INTRAVENOUS | Status: DC | PRN
Start: 2023-12-23 — End: 2023-12-25
  Filled 2023-12-23: qty 4

## 2023-12-23 MED ORDER — EPHEDRINE SULFATE-NACL 50-0.9 MG/10ML-% IV SOSY
PREFILLED_SYRINGE | INTRAVENOUS | Status: DC | PRN
  Administered 2023-12-23: 15 mg via INTRAVENOUS
  Administered 2023-12-23: 10 mg via INTRAVENOUS

## 2023-12-23 NOTE — Plan of Care (Signed)
  Problem: Coping: Goal: Ability to adjust to condition or change in health will improve Outcome: Progressing   Problem: Health Behavior/Discharge Planning: Goal: Ability to identify and utilize available resources and services will improve Outcome: Progressing Goal: Ability to manage health-related needs will improve Outcome: Progressing   Problem: Nutritional: Goal: Maintenance of adequate nutrition will improve Outcome: Progressing   Problem: Skin Integrity: Goal: Risk for impaired skin integrity will decrease Outcome: Progressing   Problem: Education: Goal: Knowledge of General Education information will improve Description: Including pain rating scale, medication(s)/side effects and non-pharmacologic comfort measures Outcome: Progressing   Problem: Coping: Goal: Level of anxiety will decrease Outcome: Progressing   Problem: Safety: Goal: Ability to remain free from injury will improve Outcome: Progressing

## 2023-12-23 NOTE — Consult Note (Signed)
 Chief Complaint: Liver mass on MRI - IR consulted for image guided liver biopsy  Referring Provider(s): Cirigliano, Sandor GAILS, DO   Supervising Physician: Philip Cornet  Patient Status: [facility] - In-pt  History of Present Illness: Destiny Rice is a 78 y.o. female with hx of afib on eliquis , CKD3, CAD, HTN, HLD, DM2, aortic stenosis s/p TAVR. Initially presented to the ED 12/19/23 with complaints of feeling generally unwell over the last 2 weeks with generalized weakness, SOB, exertional dyspnea since getting the flu vaccine 12/11/23. Found to have low hgb on labs (7.6), pt refusing all blood products besides iron infusion, as she is Jehovah witness. CT abd pelvis wo contrast from the ED shows 2 hypotense masses in the liver. Follow up MRI abd was obtained showing: 1. Two new liver lesions (left lobe 4.2 x 3.2 x 2.5 cm; posterior right lobe 2.5 x 2.1 cm) that are T2 hyperintense, T1 hypointense, and show restricted Diffusion IR now consulted for image guided liver mass biopsy.  Case and imaging reviewed with IR attending Dr. Philip, with approval to proceed with US  guided liver mass biopsy.  Today pt with some left flank soreness, which has been ongoing for several days. No additional new complaints. All questions answered.   Patient is Full Code  Past Medical History:  Diagnosis Date   AF (paroxysmal atrial fibrillation) (HCC) 03/10/2020   Allergy    Chronic kidney disease, stage 3b (HCC) 03/10/2020   Coronary artery disease    10 stents -started 2014   Hypertension    Hypothyroidism 03/10/2020   Lipoma 2014   heart   Mixed hyperlipidemia due to type 2 diabetes mellitus (HCC) 03/10/2020   PONV (postoperative nausea and vomiting)    Type 2 diabetes mellitus with stage 3b chronic kidney disease, without long-term current use of insulin  (HCC) 03/10/2020    Past Surgical History:  Procedure Laterality Date   ABDOMINAL AORTOGRAM W/LOWER EXTREMITY N/A 08/22/2022   Procedure: ABDOMINAL  AORTOGRAM W/LOWER EXTREMITY;  Surgeon: Darron Deatrice LABOR, MD;  Location: [facility];  Service: Cardiovascular;  Laterality: N/A;   ABDOMINAL HYSTERECTOMY     and bso   CAROTID ENDARTERECTOMY Left 2021   CORONARY ANGIOPLASTY WITH STENT PLACEMENT     x10   MITRAL VALVE REPAIR  12/09/2012   PERIPHERAL INTRAVASCULAR LITHOTRIPSY Right 08/22/2022   Procedure: PERIPHERAL INTRAVASCULAR LITHOTRIPSY;  Surgeon: Darron Deatrice LABOR, MD;  Location: [facility];  Service: Cardiovascular;  Laterality: Right;  R SFA, TP trunk and PT   PERIPHERAL VASCULAR BALLOON ANGIOPLASTY Right 08/22/2022   Procedure: PERIPHERAL VASCULAR BALLOON ANGIOPLASTY;  Surgeon: Darron Deatrice LABOR, MD;  Location: [facility];  Service: Cardiovascular;  Laterality: Right;  R SFA, TP trunk and PT   TRANSCATHETER AORTIC VALVE REPLACEMENT, 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    Allergies: Oseltamivir, Dapagliflozin, Iodine -131, Metformin hcl, and Sulfa antibiotics  Medications: Prior to Admission medications   Medication Sig Start Date End Date Taking? Authorizing Provider  amiodarone  (PACERONE ) 200 MG tablet TAKE 1 TABLET BY MOUTH DAILY 04/30/23  Yes Darron Deatrice LABOR, MD  apixaban  (ELIQUIS ) 5 MG TABS tablet Take 1 tablet (5 mg total) by mouth 2 (two) times daily. 01/01/23  Yes [name], Darryle Ned, MD  atorvastatin  (LIPITOR) 40 MG tablet Take 1 tablet (40 mg total) by mouth daily. 01/24/23  Yes [name], Darryle Ned, MD  busPIRone (BUSPAR) 5 MG tablet Take 5 mg by mouth daily as needed (for anxiety). 09/18/23  Yes [provider]  CALCIUM  PO Take 1 tablet by mouth daily.   Yes [provider]  carvedilol  (COREG ) 25 MG tablet Take 1 tablet (25 mg total) by mouth 2 (two) times daily with a meal. 05/29/22  Yes Wendolyn Jenkins Jansky, MD  cholecalciferol (VITAMIN D3) 25 MCG (1000 UNIT) tablet Take 1,000 Units by mouth daily.   Yes [provider]  Cyanocobalamin  (VITAMIN B-12) 5000 MCG TBDP Take 5,000 mcg by mouth  daily.   Yes [provider]  ELIQUIS  5 MG TABS tablet TAKE 1 TABLET(5 MG) BY MOUTH TWICE DAILY 07/26/23  Yes [name], Darryle Ned, MD  Ferrous Sulfate  (IRON PO) Take 2.5 mLs by mouth daily as needed (for energy). Liquid Iron   Yes [provider]  folic acid  (FOLVITE ) 400 MCG tablet Take 400 mcg by mouth daily.   Yes [provider]  hydrALAZINE  (APRESOLINE ) 50 MG tablet TAKE 1 TABLET BY MOUTH TWICE DAILY 10/16/23  Yes [name], Darryle Ned, MD  isosorbide  mononitrate (ISMO ) 20 MG tablet Take 20 mg by mouth at bedtime.   Yes [provider]  levothyroxine  (SYNTHROID ) 25 MCG tablet Take 1 tablet (25 mcg total) by mouth daily before breakfast. 05/29/22  Yes Wendolyn Jenkins Jansky, MD  meclizine  (ANTIVERT ) 12.5 MG tablet Take 1 tablet (12.5 mg total) by mouth 3 (three) times daily as needed for dizziness. 01/29/22  Yes Wendolyn Jenkins Jansky, MD  pantoprazole  (PROTONIX ) 40 MG tablet Take 1 tablet (40 mg total) by mouth 2 (two) times daily before a meal. 06/28/23  Yes [name], Darryle Ned, MD  potassium chloride  SA (KLOR-CON  M20) 20 MEQ tablet Take 1 tablet (20 mEq total) by mouth daily. 01/29/22  Yes Wendolyn Jenkins Jansky, MD  valsartan -hydrochlorothiazide  (DIOVAN -HCT) 160-25 MG tablet Take 1 tablet by mouth daily. 07/01/23  Yes [name], Darryle Ned, MD  ALPRAZolam  (XANAX ) 0.25 MG tablet Take 0.25 mg by mouth at bedtime as needed for anxiety. Patient not taking: Reported on 12/20/2023    [provider]  gabapentin  (NEURONTIN ) 100 MG capsule Take 1 capsule (100 mg total) by mouth 3 (three) times daily. Patient not taking: Reported on 12/20/2023 12/10/23   Darron Deatrice LABOR, MD  isosorbide  mononitrate (IMDUR ) 30 MG 24 hr tablet Take 1 tablet (30 mg total) by mouth daily. Patient not taking: Reported on 12/20/2023 05/10/23 12/19/24  Barbaraann Darryle Ned, MD  nitroGLYCERIN  (NITROSTAT ) 0.4 MG SL tablet Place 1 tablet (0.4 mg total) under the tongue every 5 (five) minutes as needed for chest  pain. Patient not taking: Reported on 12/20/2023 05/29/22   Barbaraann Darryle Ned, MD     Family History  Problem Relation Age of Onset   Hypertension Mother    Alcohol abuse Mother    Alcohol abuse Father    Hypertension Sister    Stroke Maternal Grandmother     Social History   Socioeconomic History   Marital status: Married    Spouse name: Not on file   Number of children: 1   Years of education: Not on file   Highest education level: Not on file  Occupational History   Occupation: retired  Tobacco Use   Smoking status: Never   Smokeless tobacco: Never  Vaping Use   Vaping status: Never Used  Substance and Sexual Activity   Alcohol use: Not Currently   Drug use: Never   Sexual activity: Not Currently  Other Topics Concern   Not on file  Social History Narrative   Son   4 grands, greats 12  Retired Stage manager.   Social Drivers of Corporate investment banker Strain: Low Risk  (06/13/2023)   Received from [facility]   Overall Financial Resource Strain (CARDIA)    Difficulty of Paying Living Expenses: Not hard at all  Food Insecurity: No Food Insecurity (12/20/2023)   Hunger Vital Sign    Worried About Running Out of Food in the Last Year: Never true    Ran Out of Food in the Last Year: Never true  Transportation Needs: No Transportation Needs (12/20/2023)   PRAPARE - Administrator, Civil Service (Medical): No    Lack of Transportation (Non-Medical): No  Physical Activity: Unknown (06/13/2023)   Received [facility]   Exercise Vital Sign    On average, how many days per week do you engage in moderate to strenuous exercise (like a brisk walk)?: 0 days    Minutes of Exercise per Session: Not on file  Stress: Stress Concern Present (06/13/2023)   Received from [facility] - Occupational Stress Questionnaire    Feeling of Stress : Rather much  Social Connections: Socially Integrated (12/20/2023)    Social Connection and Isolation Panel    Frequency of Communication with Friends and Family: More than three times a week    Frequency of Social Gatherings with Friends and Family: Twice a week    Attends Religious Services: More than 4 times per year    Active Member of [employer] or Organizations: Yes    Attends Engineer, structural: More than 4 times per year    Marital Status: Married     Review of Systems: A 12 point ROS discussed and pertinent positives are indicated in the HPI above.  All other systems are negative.   Vital Signs: BP (!) 175/61   Pulse (!) 58   Temp (!) 97.1 F (36.2 C) (Temporal)   Resp 13   Ht 5' 5 (1.651 m)   Wt 235 lb (106.6 kg)   SpO2 94%   BMI 39.11 kg/m   Advance Care Plan: No documents on file  Physical Exam Vitals and nursing note reviewed.  Constitutional:      Appearance: Normal appearance.  HENT:     Mouth/Throat:     Mouth: Mucous membranes are moist.     Pharynx: Oropharynx is clear.  Cardiovascular:     Rate and Rhythm: Normal rate and regular rhythm.  Pulmonary:     Effort: Pulmonary effort is normal.     Breath sounds: Normal breath sounds.  Abdominal:     Palpations: Abdomen is soft.     Comments: Mild left flank ttp, no other abd pain  Musculoskeletal:     Right lower leg: No edema.     Left lower leg: No edema.  Skin:    General: Skin is warm and dry.  Neurological:     Mental Status: She is alert and oriented to person, place, and time. Mental status is at baseline.     Imaging: MR LIVER W WO CONTRAST Result Date: 12/22/2023 EXAM: MRI ABDOMEN 12/21/2023 05:28:49 PM TECHNIQUE: Multiplanar multisequence MRI of the abdomen was performed without and with the administration of 10 mL gadobutrol (GADAVIST) 1 MMOL/ML injection. The exam detail is diminished due to motion artifact and patient body habitus. COMPARISON: None available. CLINICAL HISTORY: Liver lesion, > 1cm; Liver mass rule out HCC. Liver lesion, > 1cm;;  10 ml gadavist FINDINGS: LIVER: Within the dome of the left  lobe of the liver (segment 4a), there is a lobulated mass measuring 4.2 x 3.2 x 2.5 cm (axial image 12/4). This is T2 hyperintense and T1 hypointense. The branch of the hepatic vein to segment 4a is encased by this structure, which is a feature of malignancy (axial image 12/4). Unfortunately, the post-contrast images for this lesion are suboptimal due to motion artifact and timing of the contrast bolus. Additionally, reflux of contrast material into the hepatic veins diminishes the target to background ratio. Within the posterior right lobe of the liver, there is a second lesion which appears round and well-circumscribed, measuring 2.5 x 2.1 cm (image 10/4). This is also T2 hyperintense and T1 hypointense. Enhancement characteristics of this lesion are also difficult to characterize due to motion artifact and suboptimal timing of the contrast bolus. There appears to be hypoenhancement within this structure on the delayed phase images, which is a nonspecific feature. Both of these lesions were not present on the examination from 03/10/2020. Additionally, both lesions show restricted diffusion concerning for malignancy. GALLBLADDER AND BILIARY SYSTEM: Gallbladder appears normal. No bile duct dilatation. SPLEEN: Unremarkable. PANCREAS: Unremarkable. ADRENAL GLANDS: Unremarkable. KIDNEYS: Bilateral simple-appearing kidney cysts. The largest is in the anterior cortex of the left kidney, measuring 3.6 cm (image 23/4). No follow-up imaging recommended. LYMPH NODES: No lymphadenopathy. VASCULATURE: Aortic atherosclerotic calcification. PERITONEUM: No free fluid or fluid collections. BOWEL: Moderate-sized hiatal hernia. There is focal wall thickening within the sliding hiatal hernia image 17/3, which is suboptimally evaluated on the current exam due to motion. No pathologic dilatation of the bowel loops. ABDOMINAL WALL: No acute abnormality. BONES: No acute  abnormality. IMPRESSION: 1. Two new liver lesions (left lobe 4.2 x 3.2 x 2.5 cm; posterior right lobe 2.5 x 2.1 cm) that are T2 hyperintense, T1 hypointense, and show restricted diffusion. Evaluation is limited by motion artifact, patient body habitus and suboptimal contrast timing. These remain suboptimally characterized, and an underlying malignant neoplasm cannot be excluded. Recommend more definitive characterization with dedicated liver protocol CT without and with contrast material. 2. Gallstones. 3. Sliding-type hiatal hernia as noted on the CT from 12/19/2022. There is focal wall thickening in this area, which is nonspecific and difficult to further characterize due to motion artifact and suboptimal imaging post-contrast images. This can also be readdressed at the time of the contrast-enhanced liver protocol CT. Electronically signed by: Waddell Calk MD 12/22/2023 06:08 AM EDT RP Workstation: [id number]   ECHOCARDIOGRAM COMPLETE Result Date: 12/20/2023    ECHOCARDIOGRAM REPORT   Patient Name:   Destiny Rice Date of Exam: 12/20/2023 Medical Rec #:  [id number]      Height:       65.0 in Accession #:    [id number]     Weight:       235.0 lb Date of Birth:  23-Dec-1945      BSA:          2.118 m Patient Age:    78 years       BP:           144/60 mmHg Patient Gender: F              HR:           63 bpm. Exam Location:  Inpatient Procedure: 2D Echo, Cardiac Doppler and Color Doppler (Both Spectral and Color            Flow Doppler were utilized during procedure). Indications:    Elevated brain natriuretic peptide (BNP) level [[id number]]  History:        Patient has prior history of Echocardiogram examinations, most                 recent 04/23/2022. CAD; Risk Factors:Hypertension, Diabetes and                 Dyslipidemia.                 Aortic Valve: 26 mm CoreValve-Evolut Pro prosthetic, stented                 (TAVR) valve is present in the aortic position. Procedure Date:                 04/24/2018.                  Mitral Valve: prosthetic annuloplasty ring valve is present in                 the mitral position. Procedure Date: 12/09/2012.  Sonographer:    Philomena Daring Referring Phys: [id number] SUBRINA SUNDIL IMPRESSIONS  1. Left ventricular ejection fraction, by estimation, is 60 to 65%. The left ventricle has normal function. The left ventricle has no regional wall motion abnormalities. There is mild left ventricular hypertrophy. Left ventricular diastolic parameters are indeterminate. There is the interventricular septum is flattened in systole and diastole, consistent with right ventricular pressure and volume overload.  2. Right ventricular systolic function is normal. The right ventricular size is normal. There is normal pulmonary artery systolic pressure. The estimated right ventricular systolic pressure is 30.1 mmHg.  3. The mitral valve has been repaired/replaced. No evidence of mitral valve regurgitation. 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 The mean mitral valve gradient is 12.0 mmHg. There is a prosthetic annuloplasty ring present in the mitral position. Procedure  Date: 12/09/2012.  4. The aortic valve has been repaired/replaced. Aortic valve regurgitation is not visualized. No aortic stenosis is present. There is a 26 mm CoreValve-Evolut Pro prosthetic (TAVR) valve present in the aortic position. Procedure Date: 04/24/2018.  5. The inferior vena cava is dilated in size with <50% respiratory variability, suggesting right atrial pressure of 15 mmHg. FINDINGS  Left Ventricle: Left ventricular ejection fraction, by estimation, is 60 to 65%. The left ventricle has normal function. The left ventricle has no regional wall motion abnormalities. The left ventricular internal cavity size was normal in size. There is  mild left ventricular hypertrophy. The interventricular septum is flattened in systole and diastole, consistent with right ventricular pressure and volume overload. Left ventricular diastolic parameters are  indeterminate. Right Ventricle: The right ventricular size is normal. No increase in right ventricular wall thickness. Right ventricular systolic function is normal. There is normal pulmonary artery systolic pressure. The tricuspid regurgitant velocity is 1.94 m/s, and  with an assumed right atrial pressure of 15 mmHg, the estimated right ventricular systolic pressure is 30.1 mmHg. Left Atrium: Left atrial size was normal in size. Right Atrium: Right atrial size was normal in size. Pericardium: There is no evidence of pericardial effusion. Mitral Valve: The mitral valve has been repaired/replaced. No evidence of mitral valve regurgitation. There is a prosthetic annuloplasty ring present in the mitral position. Procedure Date: 12/09/2012. Moderate to severe mitral valve stenosis. MV peak gradient, 26.4 mmHg. The mean mitral valve gradient is 12.0 mmHg. Tricuspid Valve: The tricuspid valve is normal in structure. Tricuspid valve regurgitation is mild . No evidence of tricuspid stenosis. Aortic Valve: The aortic valve has been repaired/replaced. Aortic valve  regurgitation is not visualized. No aortic stenosis is present. There is a 26 mm CoreValve-Evolut Pro prosthetic, stented (TAVR) valve present in the aortic position. Procedure Date:  04/24/2018. Pulmonic Valve: The pulmonic valve was normal in structure. Pulmonic valve regurgitation is not visualized. No evidence of pulmonic stenosis. Aorta: The aortic root is normal in size and structure. Venous: The inferior vena cava is dilated in size with less than 50% respiratory variability, suggesting right atrial pressure of 15 mmHg. IAS/Shunts: No atrial level shunt detected by color flow Doppler.  LEFT VENTRICLE PLAX 2D LVIDd:         4.20 cm   Diastology LVIDs:         2.70 cm   LV e' medial:  5.87 cm/s LV PW:         1.20 cm   LV e' lateral: 5.98 cm/s LV IVS:        1.30 cm LVOT diam:     1.70 cm LV SV:         51 LV SV Index:   24 LVOT Area:     2.27 cm  RIGHT  VENTRICLE            IVC RV S prime:     8.16 cm/s  IVC diam: 2.40 cm TAPSE (M-mode): 1.7 cm LEFT ATRIUM             Index        RIGHT ATRIUM           Index LA Vol (A2C):   60.2 ml 28.42 ml/m  RA Area:     15.40 cm LA Vol (A4C):   56.0 ml 26.43 ml/m  RA Volume:   37.70 ml  17.80 ml/m LA Biplane Vol: 62.0 ml 29.27 ml/m  AORTIC VALVE LVOT Vmax:   90.20 cm/s LVOT Vmean:  61.100 cm/s LVOT VTI:    0.223 m  AORTA Ao Asc diam: 2.40 cm MITRAL VALVE              TRICUSPID VALVE MV Area VTI:  0.73 cm    TR Peak grad:   15.1 mmHg MV Peak grad: 26.4 mmHg   TR Vmax:        194.00 cm/s MV Mean grad: 12.0 mmHg MV Vmax:      2.57 m/s    SHUNTS MV Vmean:     167.0 cm/s  Systemic VTI:  0.22 m                           Systemic Diam: 1.70 cm Oneil Parchment MD Electronically signed by Oneil Parchment MD Signature Date/Time: 12/20/2023/10:37:58 AM    Final    CT ABDOMEN PELVIS WO CONTRAST Result Date: 12/19/2023 EXAM: CT ABDOMEN AND PELVIS WITHOUT CONTRAST 12/19/2023 07:53:00 PM TECHNIQUE: CT of the abdomen and pelvis was performed without the administration of intravenous contrast. Multiplanar reformatted images are provided for review. Automated exposure control, iterative reconstruction, and/or weight-based adjustment of the mA/kV was utilized to reduce the radiation dose to as low as reasonably achievable. COMPARISON: Comparison is made to March 10, 2020. CLINICAL HISTORY: Abdominal pain, acute, nonlocalized; ABD PAIN, low hgb, ckd. Chief complaints; Shortness of Breath; CT ABDOMEN PELVIS WO CONTRAST; Abdominal pain, acute, nonlocalized, ABD PAIN, low hgb, ckd. FINDINGS: LOWER CHEST: Hypoattenuation of the cardiac blood pool in keeping with at least moderate anemia. Moderate hiatal hernia. LIVER: Interval development of a lobulated hypodense mass within the left hepatic dome  measuring 5.3 x 2.5 is not mentioned but a 5.3 x 3.1 cm mass at image 10/3 and a second rounded hypodense lesion within segment 7 measuring 2.3 cm at  image 17/3. These are indeterminate on this noncontrast examination. GALLBLADDER AND BILE DUCTS: Cholelithiasis without superimposed pericholecystic inflammatory change. No intra or extrahepatic biliary ductal dilation. SPLEEN: No acute abnormality. PANCREAS: No acute abnormality. ADRENAL GLANDS: No acute abnormality. KIDNEYS, URETERS AND BLADDER: Simple cortical cysts are seen within the kidneys bilaterally for which no follow-up imaging is recommended. Vascular calcifications are noted within the renal hila bilaterally. No ureteral calculi. No hydronephrosis. No perinephric inflammatory stranding or fluid collections were identified. The bladder is unremarkable. GI AND BOWEL: Colonic diverticulosis without superimposed acute inflammatory change. The stomach, small bowel, and large bowel are otherwise unremarkable. Appendix is normal. PERITONEUM AND RETROPERITONEUM: No ascites. No free air. VASCULATURE: Extensive aortoiliac atherosclerotic calcification. Particularly prominent atherosclerotic calcifications at the origin of the mesenteric vasculature. LYMPH NODES: No lymphadenopathy. REPRODUCTIVE ORGANS: Status post hysterectomy. No adnexal mass. BONES AND SOFT TISSUES: Osseous structures are age appropriate. No acute bone abnormality. No lytic or blastic bone lesion. IMPRESSION: 1. Interval development of a lobulated hypodense mass within the left hepatic dome measuring 5.3 x 3.1 cm and a second rounded hypodense lesion within segment 7 measuring 2.3 cm, indeterminate on this noncontrast examination. 2. Cholelithiasis without superimposed pericholecystic inflammatory change. No intra or extrahepatic biliary ductal dilation. 3. Colonic diverticulosis without superimposed acute inflammatory change. 4. Moderate hiatal hernia. 5. Peripheral vascular disease. Particularly prominent atherosclerotic calcifications at the origin of the mesenteric vasculature. If there is clinical evidence of acute or chronic mesenteric  ischemia, CT arteriography may be more helpful for further evaluation. Electronically signed by: Dorethia Molt MD 12/19/2023 08:05 PM EDT RP Workstation: [id number]   DG Chest 2 View Result Date: 12/19/2023 CLINICAL DATA:  Shortness of breath. EXAM: CHEST - 2 VIEW COMPARISON:  11/22/2022 FINDINGS: Borderline cardiomegaly. No pulmonary vascular congestion. Postsurgical changes of valve replacement are seen. Lungs are clear. IMPRESSION: Borderline cardiomegaly. Electronically Signed   By: Aliene Lloyd M.D.   On: 12/19/2023 17:05    Labs:  CBC: Recent Labs    12/20/23 0327 12/21/23 0219 12/22/23 0356 12/23/23 0248  WBC 6.3 6.0 5.8 6.8  HGB 7.7* 7.2* 7.5* 7.6*  HCT 24.6* 23.1* 24.2* 24.1*  PLT 249 242 245 246    COAGS: Recent Labs    12/22/23 1528  INR 1.6*    BMP: Recent Labs    12/20/23 0327 12/21/23 0219 12/22/23 0356 12/23/23 0248  NA 137 137 138 137  K 3.4* 3.7 3.6 3.6  CL 107 107 107 107  CO2 21* 22 22 21*  GLUCOSE 166* 92 97 108*  BUN 25* 24* 27* 31*  CALCIUM  8.5* 8.2* 8.3* 8.2*  CREATININE 1.82* 1.68* 1.94* 2.21*  GFRNONAA 28* 31* 26* 22*    LIVER FUNCTION TESTS: Recent Labs    12/19/23 1612 12/20/23 0327  BILITOT 0.6 0.9  AST 31 27  ALT 28 23  ALKPHOS 57 52  PROT 6.6 6.0*  ALBUMIN 3.3* 3.0*    TUMOR MARKERS: No results for input(s): AFPTM, CEA, CA199, CHROMGRNA in the last 8760 hours.  Assessment and Plan:  Destiny Rice is a 78 y.o. female with hx of afib on eliquis , CKD3, CAD, HTN, HLD, DM2, aortic stenosis s/p TAVR. Initially presented to the ED 12/19/23 with complaints of feeling generally unwell over the last 2 weeks with generalized weakness, SOB, exertional dyspnea since  getting the flu vaccine 12/11/23. Found to have low hgb on labs (7.6), pt refusing all blood products besides iron infusion, as she is Johovah witness. CT abd pelvis wo contrast from the ED shows 2 hypotense masses in the liver. Follow up MRI abd was obtained  showing: 1. Two new liver lesions (left lobe 4.2 x 3.2 x 2.5 cm; posterior right lobe 2.5 x 2.1 cm) that are T2 hyperintense, T1 hypointense, and show restricted Diffusion IR now consulted for image guided liver mass biopsy.  Case and imaging reviewed with IR attending Dr. Philip, with approval to proceed with US  guided liver mass biopsy. Pt last dose of eliquis  was 9/28 at 0930. Plan to tentatively proceed with biopsy tomorrow 9/30 after 0930, after 48h eliquis  hold. Pt to be npo at midnight.  Risks and benefits of liver biopsy was discussed with the patient and/or patient's family including, but not limited to bleeding, infection, damage to adjacent structures or low yield requiring additional tests.  All of the questions were answered and there is agreement to proceed.  Consent signed and in chart.   Thank you for allowing our service to participate in Destiny Rice 's care.  Electronically Signed: Kimble VEAR Clas, PA-C   12/23/2023, 3:00 PM      I spent a total of 20 Minutes    in face to face in clinical consultation, greater than 50% of which was counseling/coordinating care for liver biopsy

## 2023-12-23 NOTE — Op Note (Signed)
 [facility] Patient Name: Destiny Rice Procedure Date : 12/23/2023 MRN: [id number] Attending MD: Sandor Flatter , MD, [id number] Date of Birth: 1945-04-09 CSN: [id number] Age: 78 Admit Type: Outpatient Procedure:                Upper GI endoscopy Indications:              Iron deficiency anemia, Abnormal CT of the GI                            tract, Abnormal MRI of the GI tract Providers:                Sandor Flatter, MD, Hoy jenkins Penner, RN,                            Haskel Chris, Technician Referring MD:              Medicines:                Monitored Anesthesia Care Complications:            No immediate complications. Estimated Blood Loss:     Estimated blood loss was minimal. Procedure:                Pre-Anesthesia Assessment:                           - Prior to the procedure, a History and Physical                            was performed, and patient medications and                            allergies were reviewed. The patient's tolerance of                            previous anesthesia was also reviewed. The risks                            and benefits of the procedure and the sedation                            options and risks were discussed with the patient.                            All questions were answered, and informed consent                            was obtained. Prior Anticoagulants: The patient has                            taken Eliquis  (apixaban ), last dose was 1 day prior                            to procedure. ASA Grade Assessment: III - A patient  with severe systemic disease. After reviewing the                            risks and benefits, the patient was deemed in                            satisfactory condition to undergo the procedure.                           After obtaining informed consent, the endoscope was                            passed under direct vision. Throughout the                             procedure, the patient's blood pressure, pulse, and                            oxygen saturations were monitored continuously. The                            [id number]-H190 ([id number]) Olympus endoscope was introduced                            through the mouth, and advanced to the second part                            of duodenum. The upper GI endoscopy was                            accomplished without difficulty. The patient                            tolerated the procedure well. Scope In: Scope Out: Findings:      The upper third of the esophagus and middle third of the esophagus were       normal.      One subtle, benign-appearing, intrinsic mild stenosis was found in the       distal esophagus. This stenosis measured less than one cm (in length).       The stenosis was non-obstructing and easily traversed.      A 5 cm hiatal hernia was present.      A single 4 mm sessile polyp with no bleeding was found in the cardia,       located within the hiatal hernia. Biopsies were taken with a cold       forceps for histology. Estimated blood loss was minimal. The mucosa in       the hiatal hernia was otherwise normal appearing on anterograde and       retroflexed views.      Localized area of mild, focal inflammation characterized by erythema was       found in the proximal gastric body. Biopsies were taken with a cold       forceps for histology. Estimated blood loss was minimal.      A single 5 mm sessile polyp with no bleeding  was found in the distal       gastric body. Biopsies were taken with a cold forceps for histology.       Estimated blood loss was minimal.      The examined duodenum was normal. Impression:               - Normal upper third of esophagus and middle third                            of esophagus.                           - Benign-appearing esophageal stenosis.                           - 5 cm hiatal hernia.                           - A single  gastric polyp. Biopsied.                           - Gastritis. Biopsied.                           - A single gastric polyp. Biopsied.                           - Normal examined duodenum. Recommendation:           - Return patient to hospital ward for ongoing care.                           - Clear liquid diet today.                           - Hold Eliquis  today for potential IR procedure                            and/or colonoscopy on this admission.                           - Continue present medications.                           - Await pathology results.                           - Consult Interventional Radiology for                            consideration of biopsy of one of the liver lesions                            noted on CT and MRI.                           - Will again discussed with patient the  role/utility of colonoscopy.                           - Results discussed with spouse by phone per                            patient request.                           - Results discussed with primary Hospitalist                            service. Procedure Code(s):        --- Professional ---                           [phone], Esophagogastroduodenoscopy, flexible,                            transoral; with biopsy, single or multiple Diagnosis Code(s):        --- Professional ---                           K22.2, Esophageal obstruction                           K44.9, Diaphragmatic hernia without obstruction or                            gangrene                           K31.7, Polyp of stomach and duodenum                           K29.70, Gastritis, unspecified, without bleeding                           D50.9, Iron deficiency anemia, unspecified                           R93.3, Abnormal findings on diagnostic imaging of                            other parts of digestive tract CPT copyright 2022 [employer]. All rights  reserved. The codes documented in this report are preliminary and upon coder review may  be revised to meet current compliance requirements. Sandor Flatter, MD 12/23/2023 10:59:12 AM Number of Addenda: 0

## 2023-12-23 NOTE — Progress Notes (Addendum)
 Pt presented to endoscopy today for EGD with MD Cirigliano. Post-procedurally, pt c/o sudden onset 10/10 pain in the L flank. MD Cirigliano notified and assessed pt at bedside. VO for 0.125 mg levsin SL.  Ozell VEAR Pouch, RN 12/23/23 11:11 AM  Addendum 1126: Pt endorses interval improvement in pain status with after repositioning. Now rates pain 8/10. Awaiting Levsin from pharmacy.

## 2023-12-23 NOTE — Interval H&P Note (Signed)
 History and Physical Interval Note:  No acute events overnight.  H/H stable at 7.6/24 (from 7.5/24 yesterday) with stable renal function.  Plan to proceed with EGD today to evaluate area of focal wall thickening at the GE junction noted on CT.  Eliquis  has been held and patient wishes to proceed.  12/23/2023 9:50 AM  Destiny Rice  has presented today for surgery, with the diagnosis of Iron deficiency anemia, distal esophageal wall thickening abnormal liver imaging concerning for malignancy.  The various methods of treatment have been discussed with the patient and family. After consideration of risks, benefits and other options for treatment, the patient has consented to  Procedure(s): EGD (ESOPHAGOGASTRODUODENOSCOPY) (N/A) as a surgical intervention.  The patient's history has been reviewed, patient examined, no change in status, stable for surgery.  I have reviewed the patient's chart and labs.  Questions were answered to the patient's satisfaction.     Destiny Rice Destiny Rice

## 2023-12-23 NOTE — Transfer of Care (Signed)
 Immediate Anesthesia Transfer of Care Note  Patient: Destiny Rice  Procedure(s) Performed: EGD (ESOPHAGOGASTRODUODENOSCOPY)  Patient Location: PACU and Endoscopy Unit  Anesthesia Type:MAC  Level of Consciousness: drowsy and patient cooperative  Airway & Oxygen Therapy: Patient Spontanous Breathing  Post-op Assessment: Report given to RN and Post -op Vital signs reviewed and stable  Post vital signs: Reviewed and stable  Last Vitals:  Vitals Value Taken Time  BP 131/50 12/23/23 10:46  Temp    Pulse 68 12/23/23 10:47  Resp 18 12/23/23 10:47  SpO2 93 % 12/23/23 10:47  Vitals shown include unfiled device data.  Last Pain:  Vitals:   12/23/23 1046  TempSrc:   PainSc: Asleep         Complications: No notable events documented.

## 2023-12-23 NOTE — Anesthesia Preprocedure Evaluation (Addendum)
 Anesthesia Evaluation  Patient identified by MRN, date of birth, ID band Patient awake    Reviewed: Allergy & Precautions, NPO status , Patient's Chart, lab work & pertinent test results, reviewed documented beta blocker date and time   History of Anesthesia Complications (+) PONV and history of anesthetic complications  Airway Mallampati: III  TM Distance: >3 FB     Dental  (+) Teeth Intact, Dental Advisory Given   Pulmonary neg pulmonary ROS   Pulmonary exam normal breath sounds clear to auscultation       Cardiovascular hypertension, Pt. on medications + CAD and + Peripheral Vascular Disease  Normal cardiovascular exam+ dysrhythmias Atrial Fibrillation  Rhythm:Regular Rate:Normal  EKG 12/20/23 NSR, 1st deg AVB, repolarization abnormality suggestive of ischemia  Echo 12/20/23  1. Left ventricular ejection fraction, by estimation, is 60 to 65%. The  left ventricle has normal function. The left ventricle has no regional  wall motion abnormalities. There is mild left ventricular hypertrophy.  Left ventricular diastolic parameters  are indeterminate. There is the interventricular septum is flattened in  systole and diastole, consistent with right ventricular pressure and  volume overload.   2. Right ventricular systolic function is normal. The right ventricular  size is normal. There is normal pulmonary artery systolic pressure. The  estimated right ventricular systolic pressure is 30.1 mmHg.   3. The mitral valve has been repaired/replaced. No evidence of mitral  valve regurgitation. 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 The mean mitral  valve gradient is 12.0 mmHg. There is a prosthetic annuloplasty ring  present in the mitral position. Procedure   Date: 12/09/2012.   4. The aortic valve has been repaired/replaced. Aortic valve  regurgitation is not visualized. No aortic stenosis is present. There is a  26 mm CoreValve-Evolut  Pro prosthetic (TAVR) valve present in the aortic  position. Procedure Date: 04/24/2018.   5. The inferior vena cava is dilated in size with <50% respiratory  variability, suggesting right atrial pressure of 15 mmHg.   S/P mitral valve repair    Neuro/Psych negative neurological ROS  negative psych ROS   GI/Hepatic hiatal hernia,GERD  Medicated,,Abnormal liver imaging concerning for malignancy Distal esophageal wall thickening    Endo/Other  diabetes, Well Controlled, Type 2, Oral Hypoglycemic AgentsHypothyroidism    Renal/GU Renal InsufficiencyRenal disease  negative genitourinary   Musculoskeletal negative musculoskeletal ROS (+)    Abdominal  (+) + obese  Peds  Hematology  (+) Blood dyscrasia, anemia , REFUSES BLOOD PRODUCTS, JEHOVAH'S WITNESSEliquis therapy- last dose yesterday pm   Anesthesia Other Findings   Reproductive/Obstetrics                              Anesthesia Physical Anesthesia Plan  ASA: 3  Anesthesia Plan: MAC   Post-op Pain Management: Minimal or no pain anticipated   Induction: Intravenous  PONV Risk Score and Plan: 3 and Propofol infusion and Treatment may vary due to age or medical condition  Airway Management Planned: Natural Airway, Nasal Cannula and Simple Face Mask  Additional Equipment: None  Intra-op Plan:   Post-operative Plan:   Informed Consent: I have reviewed the patients History and Physical, chart, labs and discussed the procedure including the risks, benefits and alternatives for the proposed anesthesia with the patient or authorized representative who has indicated his/her understanding and acceptance.     Dental advisory given  Plan Discussed with: CRNA and Anesthesiologist  Anesthesia Plan Comments:  Anesthesia Quick Evaluation

## 2023-12-23 NOTE — Progress Notes (Addendum)
 PROGRESS NOTE        PATIENT DETAILS Name: ANGELIQUE CHEVALIER Age: 78 y.o. Sex: female Date of Birth: [birth year] Admit Date: 12/19/2023 Admitting Physician Micaela Speaker, MD ERE:Hjcnlm, Camellia, MD  Brief Summary: Patient is a 78 y.o.  female with known history of chronic iron deficiency anemia, CKD stage IIIb, PAF on Eliquis , severe AS-s/p TAVR, DM-2 who presented with fatigue/shortness of breath-upon further evaluation-she was found to have worsening anemia and new liver mass.  Significant events: 9/25>> admit to [facility]  Significant studies: 9/25>> CXR: No PNA 9/25>> CT abdomen/pelvis: Hypodense mass in the left hepatic dome 5.3 x 3.1 cm, second rounded hypodense lesion within segment 7 measuring 2.3 cm. 9/26>> echo: EF 60-65%, moderate to severe mitral stenosis-prosthetic annuloplasty ring present, no aortic stenosis-TAVR valve present. 9/27>> MRI liver: 2 new liver lesions-due to suboptimal contrast timing/body habitus/motion artifact-underlying malignant neoplasm cannot be excluded.  Significant microbiology data: None  Procedures: None  Consults: GI  Subjective: No major issues overnight-lying comfortably in bed.  For EGD later today.  Objective: Vitals: Blood pressure (!) 158/56, pulse (!) 54, temperature 98.3 F (36.8 C), temperature source Oral, resp. rate 16, height 5' 5 (1.651 m), weight 106.6 kg, SpO2 96%.   Exam: Gen Exam:Alert awake-not in any distress HEENT:atraumatic, normocephalic Chest: B/L clear to auscultation anteriorly CVS:S1S2 regular Abdomen:soft non tender, non distended Extremities:no edema Neurology: Non focal Skin: no rash  Pertinent Labs/Radiology:    Latest Ref Rng & Units 12/23/2023    2:48 AM 12/22/2023    3:56 AM 12/21/2023    2:19 AM  CBC  WBC 4.0 - 10.5 K/uL 6.8  5.8  6.0   Hemoglobin 12.0 - 15.0 g/dL 7.6  7.5  7.2   Hematocrit 36.0 - 46.0 % 24.1  24.2  23.1   Platelets 150 - 400 K/uL 246  245  242     Lab  Results  Component Value Date   NA 137 12/23/2023   K 3.6 12/23/2023   CL 107 12/23/2023   CO2 21 (L) 12/23/2023      Assessment/Plan: Fatigue/exertional dyspnea-likely secondary to symptomatic microcytic-iron deficiency anemia No obvious blood loss-brown stools-FOBT negative on 9/25 This is a longstanding issue-on oral supplements at home-has had IV iron as an outpatient S/p IV iron x 2 this hospitalization Now on oral iron supplementation Per GI/patient history-prior endoscopic evaluation has been unremarkable. Follow CBC.  Newly diagnosed liver mass Concern for malignancy Unfortunately-MRI liver not diagnostic EGD scheduled for later today (due to concern for esophageal thickening seen on imaging studies.) If EGD is nondiagnostic-May end up needing a liver biopsy-however her hemoglobin is 7.5-with her being a [employer] not wanting blood products-difficult situation in case she does have bleeding complications.  AKI on CKD stage IIIb Slight jump in creatinine today-holding HCTZ/ARB Follow electrolytes.  HTN BP high this morning is n.p.o.-all antihypertensives held for possible EGD later today Will try to control blood pressure with as needed IV hydralazine / labetalol  Due to mild AKI-Will hold Avapro /HCTZ Continue Coreg /hydralazine /Imdur -add low-dose amlodipine .   DM-2 (A1c 6.8 on 9/26) CBG stable SSI  Recent Labs    12/22/23 2113 12/23/23 0129 12/23/23 0844  GLUCAP 220* 124* 212*     PAF Telemetry monitoring Coreg /amiodarone  Eliquis  on hold-last dose 9/28 AM.  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  Hypothyroidism Synthroid   Class 2 Obesity: Estimated body mass index is 39.11 kg/m as calculated from the following:   Height as of this  encounter: 5' 5 (1.651 m).   Weight as of this encounter: 106.6 kg.   Code status:   Code Status: Full Code   DVT Prophylaxis: SCDs Start: 12/19/23 2341 Place TED hose Start: 12/19/23 2341 apixaban  (ELIQUIS ) tablet 5 mg     Family Communication: None at bedside   Disposition Plan: Status is: Inpatient Remains inpatient appropriate because: Severity of illness   Planned Discharge Destination:Home   Diet: Diet Order             Diet NPO time specified Except for: Sips with Meds  Diet effective midnight                     Antimicrobial agents: Anti-infectives (From admission, onward)    None        MEDICATIONS: Scheduled Meds:  amiodarone   200 mg Oral Daily   amLODipine   5 mg Oral Daily   apixaban   5 mg Oral BID   atorvastatin   20 mg Oral Daily   carvedilol   25 mg Oral BID WC   ferrous sulfate   325 mg Oral Q breakfast   folic acid   500 mcg Oral Daily   gabapentin   100 mg Oral TID   hydrALAZINE   50 mg Oral BID   isosorbide  mononitrate  30 mg Oral Daily   levothyroxine   25 mcg Oral Q0600   pantoprazole   40 mg Oral BID AC   polyethylene glycol  17 g Oral Daily   sodium chloride  flush  3 mL Intravenous Q12H   sodium chloride  flush  3 mL Intravenous Q12H   Continuous Infusions: PRN Meds:.acetaminophen  **OR** acetaminophen , ALPRAZolam , alum & mag hydroxide-simeth, hydrALAZINE , insulin  aspart, labetalol , meclizine , metoCLOPramide  (REGLAN ) injection, sodium chloride  flush   I have personally reviewed following labs and imaging studies  LABORATORY DATA: CBC: Recent Labs  Lab 12/19/23 1612 12/20/23 0327 12/21/23 0219 12/22/23 0356 12/23/23 0248  WBC 6.4 6.3 6.0 5.8 6.8  NEUTROABS 4.9  --   --   --   --   HGB 8.0* 7.7* 7.2* 7.5* 7.6*  HCT 27.1* 24.6* 23.1* 24.2* 24.1*  MCV 78.3* 76.9* 76.7* 76.1* 77.7*  PLT 290 249 242 245 246    Basic Metabolic Panel: Recent Labs  Lab 12/19/23 1612 12/20/23 0327 12/21/23 0219 12/22/23 0356  12/23/23 0248  NA 137 137 137 138 137  K 4.1 3.4* 3.7 3.6 3.6  CL 103 107 107 107 107  CO2 22 21* 22 22 21*  GLUCOSE 121* 166* 92 97 108*  BUN 27* 25* 24* 27* 31*  CREATININE 2.00* 1.82* 1.68* 1.94* 2.21*  CALCIUM  8.6* 8.5* 8.2* 8.3* 8.2*    GFR: Estimated Creatinine Clearance: 25 mL/min (A) (by C-G formula based on SCr of 2.21 mg/dL (H)).  Liver Function Tests: Recent Labs  Lab 12/19/23 1612 12/20/23 0327  AST 31 27  ALT 28 23  ALKPHOS 57 52  BILITOT 0.6 0.9  PROT 6.6 6.0*  ALBUMIN 3.3* 3.0*   No results for input(s): LIPASE, AMYLASE in the last 168 hours. No results for input(s): AMMONIA in the last 168 hours.  Coagulation Profile: Recent Labs  Lab 12/22/23 1528  INR 1.6*    Cardiac Enzymes: No results for input(s): CKTOTAL, CKMB, CKMBINDEX,  TROPONINI in the last 168 hours.  BNP (last 3 results) No results for input(s): PROBNP in the last 8760 hours.  Lipid Profile: No results for input(s): CHOL, HDL, LDLCALC, TRIG, CHOLHDL, LDLDIRECT in the last 72 hours.  Thyroid  Function Tests: No results for input(s): TSH, T4TOTAL, FREET4, T3FREE, THYROIDAB in the last 72 hours.  Anemia Panel: No results for input(s): VITAMINB12, FOLATE, FERRITIN, TIBC, IRON, RETICCTPCT in the last 72 hours.   Urine analysis:    Component Value Date/Time   COLORURINE YELLOW 03/10/2020 0330   APPEARANCEUR CLEAR 03/10/2020 0330   LABSPEC 1.012 03/10/2020 0330   PHURINE 6.0 03/10/2020 0330   GLUCOSEU NEGATIVE 03/10/2020 0330   HGBUR NEGATIVE 03/10/2020 0330   BILIRUBINUR NEGATIVE 03/10/2020 0330   KETONESUR NEGATIVE 03/10/2020 0330   PROTEINUR NEGATIVE 03/10/2020 0330   NITRITE NEGATIVE 03/10/2020 0330   LEUKOCYTESUR NEGATIVE 03/10/2020 0330    Sepsis Labs: Lactic Acid, Venous No results found for: LATICACIDVEN  MICROBIOLOGY: No results found for this or any previous visit (from the past 240 hours).  RADIOLOGY  STUDIES/RESULTS: MR LIVER W WO CONTRAST Result Date: 12/22/2023 EXAM: MRI ABDOMEN 12/21/2023 05:28:49 PM TECHNIQUE: Multiplanar multisequence MRI of the abdomen was performed without and with the administration of 10 mL gadobutrol (GADAVIST) 1 MMOL/ML injection. The exam detail is diminished due to motion artifact and patient body habitus. COMPARISON: None available. CLINICAL HISTORY: Liver lesion, > 1cm; Liver mass rule out HCC. Liver lesion, > 1cm;; 10 ml gadavist FINDINGS: LIVER: Within the dome of the left lobe of the liver (segment 4a), there is a lobulated mass measuring 4.2 x 3.2 x 2.5 cm (axial image 12/4). This is T2 hyperintense and T1 hypointense. The branch of the hepatic vein to segment 4a is encased by this structure, which is a feature of malignancy (axial image 12/4). Unfortunately, the post-contrast images for this lesion are suboptimal due to motion artifact and timing of the contrast bolus. Additionally, reflux of contrast material into the hepatic veins diminishes the target to background ratio. Within the posterior right lobe of the liver, there is a second lesion which appears round and well-circumscribed, measuring 2.5 x 2.1 cm (image 10/4). This is also T2 hyperintense and T1 hypointense. Enhancement characteristics of this lesion are also difficult to characterize due to motion artifact and suboptimal timing of the contrast bolus. There appears to be hypoenhancement within this structure on the delayed phase images, which is a nonspecific feature. Both of these lesions were not present on the examination from 03/10/2020. Additionally, both lesions show restricted diffusion concerning for malignancy. GALLBLADDER AND BILIARY SYSTEM: Gallbladder appears normal. No bile duct dilatation. SPLEEN: Unremarkable. PANCREAS: Unremarkable. ADRENAL GLANDS: Unremarkable. KIDNEYS: Bilateral simple-appearing kidney cysts. The largest is in the anterior cortex of the left kidney, measuring 3.6 cm (image  23/4). No follow-up imaging recommended. LYMPH NODES: No lymphadenopathy. VASCULATURE: Aortic atherosclerotic calcification. PERITONEUM: No free fluid or fluid collections. BOWEL: Moderate-sized hiatal hernia. There is focal wall thickening within the sliding hiatal hernia image 17/3, which is suboptimally evaluated on the current exam due to motion. No pathologic dilatation of the bowel loops. ABDOMINAL WALL: No acute abnormality. BONES: No acute abnormality. IMPRESSION: 1. Two new liver lesions (left lobe 4.2 x 3.2 x 2.5 cm; posterior right lobe 2.5 x 2.1 cm) that are T2 hyperintense, T1 hypointense, and show restricted diffusion. Evaluation is limited by motion artifact, patient body habitus and suboptimal contrast timing. These remain suboptimally characterized, and an underlying malignant neoplasm cannot be excluded. Recommend more  definitive characterization with dedicated liver protocol CT without and with contrast material. 2. Gallstones. 3. Sliding-type hiatal hernia as noted on the CT from 12/19/2022. There is focal wall thickening in this area, which is nonspecific and difficult to further characterize due to motion artifact and suboptimal imaging post-contrast images. This can also be readdressed at the time of the contrast-enhanced liver protocol CT. Electronically signed by: Waddell Calk MD 12/22/2023 06:08 AM EDT RP Workstation: [id number]     LOS: 4 days   Donalda Applebaum, MD  [facility]    To contact the attending provider between 7A-7P or the covering provider during after hours 7P-7A, please log into the web site [email or web address] and access using universal [place] password for that web site. If you do not have the password, please call the hospital operator.  12/23/2023, 9:06 AM

## 2023-12-23 NOTE — TOC CM/SW Note (Signed)
 Transition of Care [facility]) - Inpatient Brief Assessment   Patient Details  Name: Destiny Rice MRN: [id number] Date of Birth: [birth year]  Transition of Care [facility]) CM/SW Contact:    Destiny JULIANNA George, RN Phone Number: 12/23/2023, 3:40 PM   Clinical Narrative:  No current IP care management needs. If needs arise please place consult.  Transition of Care Asessment: Insurance and Status: Insurance coverage has been reviewed Patient has primary care physician: Yes Home environment has been reviewed: home with spouse   Prior/Current Home Services: No current home services Social Drivers of Health Review: SDOH reviewed no interventions necessary Readmission risk has been reviewed: Yes Transition of care needs: no transition of care needs at this time

## 2023-12-23 NOTE — Anesthesia Postprocedure Evaluation (Signed)
 Anesthesia Post Note  Patient: Destiny Rice  Procedure(s) Performed: EGD (ESOPHAGOGASTRODUODENOSCOPY)     Patient location during evaluation: PACU Anesthesia Type: MAC Level of consciousness: awake and alert and oriented Pain management: pain level controlled Vital Signs Assessment: post-procedure vital signs reviewed and stable Respiratory status: spontaneous breathing, nonlabored ventilation and respiratory function stable Cardiovascular status: stable and blood pressure returned to baseline Postop Assessment: no apparent nausea or vomiting Anesthetic complications: no   No notable events documented.  Last Vitals:  Vitals:   12/23/23 1110 12/23/23 1120  BP: (!) 131/56 (!) 145/59  Pulse: 63 61  Resp: 19 20  Temp:    SpO2: 93% 94%    Last Pain:  Vitals:   12/23/23 1120  TempSrc:   PainSc: 8                  Rayelle Armor A.

## 2023-12-24 ENCOUNTER — Inpatient Hospital Stay (HOSPITAL_COMMUNITY)

## 2023-12-24 DIAGNOSIS — N1832 Chronic kidney disease, stage 3b: Secondary | ICD-10-CM | POA: Diagnosis not present

## 2023-12-24 DIAGNOSIS — E038 Other specified hypothyroidism: Secondary | ICD-10-CM | POA: Diagnosis not present

## 2023-12-24 DIAGNOSIS — I160 Hypertensive urgency: Secondary | ICD-10-CM | POA: Diagnosis not present

## 2023-12-24 DIAGNOSIS — D649 Anemia, unspecified: Secondary | ICD-10-CM | POA: Diagnosis not present

## 2023-12-24 LAB — RESPIRATORY PANEL BY PCR

## 2023-12-24 LAB — CBC
HCT: 26.2 % — ABNORMAL LOW (ref 36.0–46.0)
Hemoglobin: 8 g/dL — ABNORMAL LOW (ref 12.0–15.0)
MCH: 23.5 pg — ABNORMAL LOW (ref 26.0–34.0)
MCHC: 30.5 g/dL (ref 30.0–36.0)
MCV: 77.1 fL — ABNORMAL LOW (ref 80.0–100.0)
Platelets: 262 K/uL (ref 150–400)
RBC: 3.4 MIL/uL — ABNORMAL LOW (ref 3.87–5.11)
RDW: 17.5 % — ABNORMAL HIGH (ref 11.5–15.5)
WBC: 6.4 K/uL (ref 4.0–10.5)
nRBC: 0 % (ref 0.0–0.2)

## 2023-12-24 LAB — GLUCOSE, CAPILLARY
Glucose-Capillary: 120 mg/dL — ABNORMAL HIGH (ref 70–99)
Glucose-Capillary: 119 mg/dL — ABNORMAL HIGH (ref 70–99)
Glucose-Capillary: 140 mg/dL — ABNORMAL HIGH (ref 70–99)
Glucose-Capillary: 161 mg/dL — ABNORMAL HIGH (ref 70–99)

## 2023-12-24 LAB — BASIC METABOLIC PANEL WITH GFR
Anion gap: 9 (ref 5–15)
BUN: 23 mg/dL (ref 8–23)
CO2: 22 mmol/L (ref 22–32)
Calcium: 8.4 mg/dL — ABNORMAL LOW (ref 8.9–10.3)
Chloride: 107 mmol/L (ref 98–111)
Creatinine, Ser: 1.62 mg/dL — ABNORMAL HIGH (ref 0.44–1.00)
GFR, Estimated: 32 mL/min — ABNORMAL LOW (ref >60)
Glucose, Bld: 93 mg/dL (ref 70–99)
Potassium: 3.4 mmol/L — ABNORMAL LOW (ref 3.5–5.1)
Sodium: 138 mmol/L (ref 135–145)

## 2023-12-24 LAB — CANCER ANTIGEN 19-9: CA 19-9: 23 U/mL (ref 0–35)

## 2023-12-24 LAB — PROTIME-INR
INR: 1.2 (ref 0.8–1.2)
Prothrombin Time: 15.7 s — ABNORMAL HIGH (ref 11.4–15.2)

## 2023-12-24 LAB — SARS CORONAVIRUS 2 BY RT PCR: SARS Coronavirus 2 by RT PCR: NEGATIVE

## 2023-12-24 LAB — AFP TUMOR MARKER: AFP, Serum, Tumor Marker: 2 ng/mL (ref 0.0–9.2)

## 2023-12-24 LAB — CEA: CEA: 11.4 ng/mL — ABNORMAL HIGH (ref 0.0–4.7)

## 2023-12-24 MED ORDER — POTASSIUM CHLORIDE 10 MEQ/100ML IV SOLN
10.0000 meq | INTRAVENOUS | Status: AC
  Administered 2023-12-24 (×2): 10 meq via INTRAVENOUS
  Filled 2023-12-24 (×2): qty 100

## 2023-12-24 MED ORDER — OXYMETAZOLINE HCL 0.05 % NA SOLN
1.0000 | Freq: Two times a day (BID) | NASAL | Status: DC
  Administered 2023-12-24 – 2023-12-25 (×3): 1 via NASAL
  Filled 2023-12-24: qty 30

## 2023-12-24 MED ORDER — AMLODIPINE BESYLATE 10 MG PO TABS
10.0000 mg | ORAL_TABLET | Freq: Every day | ORAL | Status: DC
  Administered 2023-12-25: 10 mg via ORAL
  Filled 2023-12-24: qty 1

## 2023-12-24 MED ORDER — HYDRALAZINE HCL 20 MG/ML IJ SOLN
INTRAMUSCULAR | Status: AC
Start: 2023-12-24 — End: 2023-12-24
  Filled 2023-12-24: qty 1

## 2023-12-24 MED ORDER — LORATADINE 10 MG PO TABS
10.0000 mg | ORAL_TABLET | Freq: Every day | ORAL | Status: DC
  Administered 2023-12-24 – 2023-12-25 (×2): 10 mg via ORAL
  Filled 2023-12-24 (×2): qty 1

## 2023-12-24 MED ORDER — FLUTICASONE PROPIONATE 50 MCG/ACT NA SUSP
2.0000 | Freq: Every day | NASAL | Status: DC
  Administered 2023-12-24 – 2023-12-25 (×2): 2 via NASAL
  Filled 2023-12-24: qty 16

## 2023-12-24 MED ORDER — HYDRALAZINE HCL 20 MG/ML IJ SOLN
10.0000 mg | Freq: Once | INTRAMUSCULAR | Status: AC
Start: 2023-12-24 — End: 2023-12-24
  Administered 2023-12-24: 10 mg via INTRAVENOUS

## 2023-12-24 NOTE — Plan of Care (Signed)
  Problem: Coping: Goal: Ability to adjust to condition or change in health will improve Outcome: Progressing   Problem: Fluid Volume: Goal: Ability to maintain a balanced intake and output will improve Outcome: Progressing   Problem: Health Behavior/Discharge Planning: Goal: Ability to manage health-related needs will improve Outcome: Progressing   Problem: Metabolic: Goal: Ability to maintain appropriate glucose levels will improve Outcome: Progressing   Problem: Nutritional: Goal: Maintenance of adequate nutrition will improve Outcome: Progressing   Problem: Skin Integrity: Goal: Risk for impaired skin integrity will decrease Outcome: Progressing   Problem: Tissue Perfusion: Goal: Adequacy of tissue perfusion will improve Outcome: Progressing   Problem: Education: Goal: Knowledge of General Education information will improve Description: Including pain rating scale, medication(s)/side effects and non-pharmacologic comfort measures Outcome: Progressing   Problem: Health Behavior/Discharge Planning: Goal: Ability to manage health-related needs will improve Outcome: Progressing   Problem: Clinical Measurements: Goal: Ability to maintain clinical measurements within normal limits will improve Outcome: Progressing Goal: Will remain free from infection Outcome: Progressing Goal: Diagnostic test results will improve Outcome: Progressing Goal: Respiratory complications will improve Outcome: Progressing Goal: Cardiovascular complication will be avoided Outcome: Progressing   Problem: Activity: Goal: Risk for activity intolerance will decrease Outcome: Progressing   Problem: Nutrition: Goal: Adequate nutrition will be maintained Outcome: Progressing   Problem: Coping: Goal: Level of anxiety will decrease Outcome: Progressing   Problem: Elimination: Goal: Will not experience complications related to bowel motility Outcome: Progressing Goal: Will not experience  complications related to urinary retention Outcome: Progressing   Problem: Pain Managment: Goal: General experience of comfort will improve and/or be controlled Outcome: Progressing   Problem: Safety: Goal: Ability to remain free from injury will improve Outcome: Progressing   Problem: Skin Integrity: Goal: Risk for impaired skin integrity will decrease Outcome: Progressing

## 2023-12-24 NOTE — Progress Notes (Addendum)
 Daily Progress Note  DOA: 12/19/2023 Hospital Day: 6  Cc:  iron deficiency anemia, liver masses  ASSESSMENT    78 yo female with chronic iron deficiency anemia (previously evaluated and managed in [place] ) admitted with worsening anemia. FOBT negative.  Ferritin 16. Got IV iron. EGD showing benign esophageal stenosis, 5 cm HH, gastritis, a gastric polyp. Biopsies pending. Colonoscopy offered, patient preferred not to pursue at this time.   TODAY: Hgb stable at 8  [employer], declines blood products  Liver masses ( new) MR liver showing 2 new liver lesions (left lobe 4.2 x 3.2 x 2.5 cm; posterior right lobe 2.5 x 2.1 cm). Neoplasm not excluded.   AFIB On Eliquis   CAD s/p prior PCI  CKD  Principal Problem:   Symptomatic anemia Active Problems:   Hypertensive urgency   Paroxysmal atrial fibrillation (HCC)   CKD stage 3b, GFR 30-44 ml/min (HCC)   Non-insulin  dependent type 2 diabetes mellitus (HCC)   Hypothyroidism   History of aortic stenosis s/p TAVR   History of CAD (coronary artery disease)   Hyperlipidemia   Peripheral artery disease   Liver lesion   Iron deficiency anemia   Hiatal hernia   Abnormal finding on GI tract imaging   Gastric polyp   Gastritis and gastroduodenitis    PLAN   For liver biopsy tomorrow. Could get done today due to elevated BP  Subjective   No physical complaints.    Objective   GI Studies:   EGD 12/23/23 - Normal upper third of esophagus and middle third of esophagus. - Benign-appearing esophageal stenosis. - 5 cm hiatal hernia. - A single gastric polyp. Biopsied. - Gastritis. Biopsied. - A single gastric polyp. Biopsied. - Normal examined duodenum.   MR liver IMPRESSION: 1. Two new liver lesions (left lobe 4.2 x 3.2 x 2.5 cm; posterior right lobe 2.5 x 2.1 cm) that are T2 hyperintense, T1 hypointense, and show restricted diffusion. Evaluation is limited by motion artifact, patient body habitus and suboptimal  contrast timing. These remain suboptimally characterized, and an underlying malignant neoplasm cannot be excluded. Recommend more definitive characterization with dedicated liver protocol CT without and with contrast material. 2. Gallstones. 3. Sliding-type hiatal hernia as noted on the CT from 12/19/2022. There is focal wall thickening in this area, which is nonspecific and difficult to further characterize due to motion artifact and suboptimal imaging post-contrast images. This can also be readdressed at the time of the contrast-enhanced liver protocol CT.  Recent Labs    12/22/23 0356 12/23/23 0248 12/24/23 0250  WBC 5.8 6.8 6.4  HGB 7.5* 7.6* 8.0*  HCT 24.2* 24.1* 26.2*  MCV 76.1* 77.7* 77.1*  PLT 245 246 262   No results for input(s): FOLATE, VITAMINB12, FERRITIN, TIBC, IRONPCTSAT in the last 72 hours. Recent Labs    12/22/23 0356 12/23/23 0248 12/24/23 0250  NA 138 137 138  K 3.6 3.6 3.4*  CL 107 107 107  CO2 22 21* 22  GLUCOSE 97 108* 93  BUN 27* 31* 23  CREATININE 1.94* 2.21* 1.62*  CALCIUM  8.3* 8.2* 8.4*   No results for input(s): PROT, ALBUMIN, AST, ALT, ALKPHOS, BILITOT, BILIDIR, IBILI in the last 72 hours.    Imaging:  MR LIVER W WO CONTRAST EXAM: MRI ABDOMEN 12/21/2023 05:28:49 PM  TECHNIQUE: Multiplanar multisequence MRI of the abdomen was performed without and with the administration of 10 mL gadobutrol (GADAVIST) 1 MMOL/ML injection. The exam detail is diminished due to motion artifact and patient  body habitus.  COMPARISON: None available.  CLINICAL HISTORY: Liver lesion, > 1cm; Liver mass rule out HCC. Liver lesion, > 1cm;; 10 ml gadavist  FINDINGS:  LIVER: Within the dome of the left lobe of the liver (segment 4a), there is a lobulated mass measuring 4.2 x 3.2 x 2.5 cm (axial image 12/4). This is T2 hyperintense and T1 hypointense. The branch of the hepatic vein to segment 4a is encased by this structure, which  is a feature of malignancy (axial image 12/4). Unfortunately, the post-contrast images for this lesion are suboptimal due to motion artifact and timing of the contrast bolus. Additionally, reflux of contrast material into the hepatic veins diminishes the target to background ratio. Within the posterior right lobe of the liver, there is a second lesion which appears round and well-circumscribed, measuring 2.5 x 2.1 cm (image 10/4). This is also T2 hyperintense and T1 hypointense. Enhancement characteristics of this lesion are also difficult to characterize due to motion artifact and suboptimal timing of the contrast bolus. There appears to be hypoenhancement within this structure on the delayed phase images, which is a nonspecific feature. Both of these lesions were not present on the examination from 03/10/2020. Additionally, both lesions show restricted diffusion concerning for malignancy.  GALLBLADDER AND BILIARY SYSTEM: Gallbladder appears normal. No bile duct dilatation.  SPLEEN: Unremarkable.  PANCREAS: Unremarkable.  ADRENAL GLANDS: Unremarkable.  KIDNEYS: Bilateral simple-appearing kidney cysts. The largest is in the anterior cortex of the left kidney, measuring 3.6 cm (image 23/4). No follow-up imaging recommended.  LYMPH NODES: No lymphadenopathy.  VASCULATURE: Aortic atherosclerotic calcification.  PERITONEUM: No free fluid or fluid collections.  BOWEL: Moderate-sized hiatal hernia. There is focal wall thickening within the sliding hiatal hernia image 17/3, which is suboptimally evaluated on the current exam due to motion. No pathologic dilatation of the bowel loops.  ABDOMINAL WALL: No acute abnormality.  BONES: No acute abnormality.  IMPRESSION: 1. Two new liver lesions (left lobe 4.2 x 3.2 x 2.5 cm; posterior right lobe 2.5 x 2.1 cm) that are T2 hyperintense, T1 hypointense, and show restricted diffusion. Evaluation is limited by motion artifact,  patient body habitus and suboptimal contrast timing. These remain suboptimally characterized, and an underlying malignant neoplasm cannot be excluded. Recommend more definitive characterization with dedicated liver protocol CT without and with contrast material. 2. Gallstones. 3. Sliding-type hiatal hernia as noted on the CT from 12/19/2022. There is focal wall thickening in this area, which is nonspecific and difficult to further characterize due to motion artifact and suboptimal imaging post-contrast images. This can also be readdressed at the time of the contrast-enhanced liver protocol CT.  Electronically signed by: Taylor Stroud MD 12/22/2023 06:08 AM EDT RP Workstation: [id number]     Scheduled inpatient medications:   amiodarone   200 mg Oral Daily   [START ON 12/25/2023] amLODipine   10 mg Oral Daily   atorvastatin   20 mg Oral Daily   carvedilol   25 mg Oral BID WC   ferrous sulfate   325 mg Oral Q breakfast   fluticasone  2 spray Each Nare Daily   folic acid   500 mcg Oral Daily   gabapentin   100 mg Oral TID   hydrALAZINE   50 mg Oral BID   insulin  aspart  0-9 Units Subcutaneous TID WC   isosorbide  mononitrate  30 mg Oral Daily   levothyroxine   25 mcg Oral Q0600   loratadine  10 mg Oral Daily   oxymetazoline  1 spray Each Nare BID   pantoprazole   40  mg Oral BID AC   polyethylene glycol  17 g Oral Daily   sodium chloride  flush  3 mL Intravenous Q12H   sodium chloride  flush  3 mL Intravenous Q12H   Continuous inpatient infusions:  PRN inpatient medications: acetaminophen  **OR** acetaminophen , ALPRAZolam , alum & mag hydroxide-simeth, hydrALAZINE , labetalol , meclizine , metoCLOPramide  (REGLAN ) injection, sodium chloride  flush  Vital signs in last 24 hours: Temp:  [97.6 F (36.4 C)-98 F (36.7 C)] 98 F (36.7 C) (09/30 1214) Pulse Rate:  [57-68] 66 (09/30 1214) Resp:  [19-22] 19 (09/30 1214) BP: (152-213)/(54-76) 189/66 (09/30 1214) SpO2:  [94 %-95 %] 95 % (09/30  1041) Last BM Date : 12/24/23 No intake or output data in the 24 hours ending 12/24/23 1549  Intake/Output from previous day: 09/29 0701 - 09/30 0700 In: 100 [I.V.:100] Out: -  Intake/Output this shift: No intake/output data recorded.   Physical Exam:  General: Alert female in NAD Heart:  Regular rate, + murmur.  Pulmonary: Normal respiratory effort Abdomen: Soft, nondistended, nontender. Normal bowel sounds. Extremities: No lower extremity edema  Neurologic: Alert and oriented Psych: Pleasant. Cooperative     LOS: 5 days   Vina Dasen ,NP 12/24/2023, 3:49 PM     Attending physician's note   I have taken a history, reviewed the chart, and examined the patient. I performed a substantive portion of this encounter, including complete performance of at least one of the key components, in conjunction with the APP. I agree with the APP's note, impression, and recommendations with my edits.   Patient went to IR this morning for liver biopsy, but postponed due to hypertension.  She is otherwise without any active issues currently.  EGD completed yesterday notable for 5 cm hiatal hernia, mild nonulcer gastritis (path: Iron deposition gastritis), 1 gastric polyp and 1 polyp within the hiatal hernia, both benign.  Reviewed pathology results with patient today.  H/H stable.  Again offered colonoscopy and she declines.  - Plan for liver biopsy tomorrow - Trend CBCs while in house - Received IV iron - BP management per primary Hospital service - Inpatient GI service will sign off at this time.  Please do not hesitate to contact us  with additional questions or concerns  Sandor Flatter, DO, FACG [phone] office

## 2023-12-24 NOTE — Progress Notes (Signed)
 PROGRESS NOTE        PATIENT DETAILS Name: Destiny Rice Age: 78 y.o. Sex: female Date of Birth: [birth year] Admit Date: 12/19/2023 Admitting Physician Micaela Speaker, MD ERE:Hjcnlm, Camellia, MD  Brief Summary: Patient is a 78 y.o.  female with known history of chronic iron deficiency anemia, CKD stage IIIb, PAF on Eliquis , severe AS-s/p TAVR, DM-2 who presented with fatigue/shortness of breath-upon further evaluation-she was found to have worsening anemia and new liver mass.  Significant events: 9/25>> admit to [facility]  Significant studies: 9/25>> CXR: No PNA 9/25>> CT abdomen/pelvis: Hypodense mass in the left hepatic dome 5.3 x 3.1 cm, second rounded hypodense lesion within segment 7 measuring 2.3 cm. 9/26>> echo: EF 60-65%, moderate to severe mitral stenosis-prosthetic annuloplasty ring present, no aortic stenosis-TAVR valve present. 9/27>> MRI liver: 2 new liver lesions-due to suboptimal contrast timing/body habitus/motion artifact-underlying malignant neoplasm cannot be excluded.  Significant microbiology data: None  Procedures: 9/29>> EGD: No evidence of malignancy.  Consults: GI  Subjective: Has developed some URI symptoms-cough-nasal congestion.  However no other complaints.  Objective: Vitals: Blood pressure (!) 172/56, pulse 65, temperature 98 F (36.7 C), temperature source Oral, resp. rate 20, height 5' 5 (1.651 m), weight 106.6 kg, SpO2 95%.   Exam: Gen Exam:Alert awake-not in any distress HEENT:atraumatic, normocephalic Chest: B/L clear to auscultation anteriorly CVS:S1S2 regular Abdomen:soft non tender, non distended Extremities:no edema Neurology: Non focal Skin: no rash  Pertinent Labs/Radiology:    Latest Ref Rng & Units 12/24/2023    2:50 AM 12/23/2023    2:48 AM 12/22/2023    3:56 AM  CBC  WBC 4.0 - 10.5 K/uL 6.4  6.8  5.8   Hemoglobin 12.0 - 15.0 g/dL 8.0  7.6  7.5   Hematocrit 36.0 - 46.0 % 26.2  24.1  24.2   Platelets  150 - 400 K/uL 262  246  245     Lab Results  Component Value Date   NA 138 12/24/2023   K 3.4 (L) 12/24/2023   CL 107 12/24/2023   CO2 22 12/24/2023      Assessment/Plan: Fatigue/exertional dyspnea-likely secondary to symptomatic microcytic-iron deficiency anemia No obvious blood loss-brown stools-FOBT negative on 9/25 This is a longstanding issue-on oral supplements at home-has had IV iron as an outpatient S/p IV iron x 2 this hospitalization Now on oral iron supplementation EGD on 9/29 without any major findings Patient not keen on pursuing colonoscopy-prior evaluation has been unremarkable. Follow CBC.  Newly diagnosed liver mass Concern for malignancy Unfortunately-MRI liver not diagnostic EGD negative for any malignancy Patient not keen on pursuing colonoscopy IR following-for liver biopsy later today.   AKI on CKD stage IIIb Slight jump in creatinine today-holding HCTZ/ARB Follow electrolytes.  HTN BP remains on the higher side Continue Coreg /hydralazine /Imdur /amlodipine  Continue to hold Avapro /HCTZ.    DM-2 (A1c 6.8 on 9/26) CBG stable SSI  Recent Labs    12/23/23 1631 12/23/23 2111 12/24/23 0607  GLUCAP 126* 159* 120*     PAF Telemetry monitoring Coreg /amiodarone  Eliquis  on hold-last dose 9/28 AM.  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  Hypothyroidism Synthroid   Class 2 Obesity: Estimated body mass index is 39.11 kg/m as calculated from the  following:   Height as of this encounter: 5' 5 (1.651 m).   Weight as of this encounter: 106.6 kg.   Code status:   Code Status: Full Code   DVT Prophylaxis: SCDs Start: 12/19/23 2341 Place TED hose Start: 12/19/23 2341     Family Communication: None at bedside   Disposition Plan: Status  is: Inpatient Remains inpatient appropriate because: Severity of illness   Planned Discharge Destination:Home   Diet: Diet Order             Diet NPO time specified  Diet effective midnight                     Antimicrobial agents: Anti-infectives (From admission, onward)    None        MEDICATIONS: Scheduled Meds:  amiodarone   200 mg Oral Daily   amLODipine   5 mg Oral Daily   atorvastatin   20 mg Oral Daily   carvedilol   25 mg Oral BID WC   ferrous sulfate   325 mg Oral Q breakfast   fluticasone  2 spray Each Nare Daily   folic acid   500 mcg Oral Daily   gabapentin   100 mg Oral TID   hydrALAZINE   50 mg Oral BID   insulin  aspart  0-9 Units Subcutaneous TID WC   isosorbide  mononitrate  30 mg Oral Daily   levothyroxine   25 mcg Oral Q0600   loratadine  10 mg Oral Daily   oxymetazoline  1 spray Each Nare BID   pantoprazole   40 mg Oral BID AC   polyethylene glycol  17 g Oral Daily   sodium chloride  flush  3 mL Intravenous Q12H   sodium chloride  flush  3 mL Intravenous Q12H   Continuous Infusions:  potassium chloride  10 mEq (12/24/23 0944)   PRN Meds:.acetaminophen  **OR** acetaminophen , ALPRAZolam , alum & mag hydroxide-simeth, hydrALAZINE , labetalol , meclizine , metoCLOPramide  (REGLAN ) injection, sodium chloride  flush   I have personally reviewed following labs and imaging studies  LABORATORY DATA: CBC: Recent Labs  Lab 12/19/23 1612 12/20/23 0327 12/21/23 0219 12/22/23 0356 12/23/23 0248 12/24/23 0250  WBC 6.4 6.3 6.0 5.8 6.8 6.4  NEUTROABS 4.9  --   --   --   --   --   HGB 8.0* 7.7* 7.2* 7.5* 7.6* 8.0*  HCT 27.1* 24.6* 23.1* 24.2* 24.1* 26.2*  MCV 78.3* 76.9* 76.7* 76.1* 77.7* 77.1*  PLT 290 249 242 245 246 262    Basic Metabolic Panel: Recent Labs  Lab 12/20/23 0327 12/21/23 0219 12/22/23 0356 12/23/23 0248 12/24/23 0250  NA 137 137 138 137 138  K 3.4* 3.7 3.6 3.6 3.4*  CL 107 107 107 107 107  CO2 21* 22 22 21* 22  GLUCOSE 166* 92 97  108* 93  BUN 25* 24* 27* 31* 23  CREATININE 1.82* 1.68* 1.94* 2.21* 1.62*  CALCIUM  8.5* 8.2* 8.3* 8.2* 8.4*    GFR: Estimated Creatinine Clearance: 34.1 mL/min (A) (by C-G formula based on SCr of 1.62 mg/dL (H)).  Liver Function Tests: Recent Labs  Lab 12/19/23 1612 12/20/23 0327  AST 31 27  ALT 28 23  ALKPHOS 57 52  BILITOT 0.6 0.9  PROT 6.6 6.0*  ALBUMIN 3.3* 3.0*   No results for input(s): LIPASE, AMYLASE in the last 168 hours. No results for input(s): AMMONIA in the last 168 hours.  Coagulation Profile: Recent Labs  Lab 12/22/23 1528 12/24/23 0250  INR 1.6* 1.2    Cardiac Enzymes: No results for input(s): CKTOTAL, CKMB, CKMBINDEX,  TROPONINI in the last 168 hours.  BNP (last 3 results) No results for input(s): PROBNP in the last 8760 hours.  Lipid Profile: No results for input(s): CHOL, HDL, LDLCALC, TRIG, CHOLHDL, LDLDIRECT in the last 72 hours.  Thyroid  Function Tests: No results for input(s): TSH, T4TOTAL, FREET4, T3FREE, THYROIDAB in the last 72 hours.  Anemia Panel: No results for input(s): VITAMINB12, FOLATE, FERRITIN, TIBC, IRON, RETICCTPCT in the last 72 hours.   Urine analysis:    Component Value Date/Time   COLORURINE YELLOW 03/10/2020 0330   APPEARANCEUR CLEAR 03/10/2020 0330   LABSPEC 1.012 03/10/2020 0330   PHURINE 6.0 03/10/2020 0330   GLUCOSEU NEGATIVE 03/10/2020 0330   HGBUR NEGATIVE 03/10/2020 0330   BILIRUBINUR NEGATIVE 03/10/2020 0330   KETONESUR NEGATIVE 03/10/2020 0330   PROTEINUR NEGATIVE 03/10/2020 0330   NITRITE NEGATIVE 03/10/2020 0330   LEUKOCYTESUR NEGATIVE 03/10/2020 0330    Sepsis Labs: Lactic Acid, Venous No results found for: LATICACIDVEN  MICROBIOLOGY: No results found for this or any previous visit (from the past 240 hours).  RADIOLOGY STUDIES/RESULTS: No results found.    LOS: 5 days   Donalda Applebaum, MD  [facility]    To contact the  attending provider between 7A-7P or the covering provider during after hours 7P-7A, please log into the web site [email or web address] and access using universal [place] password for that web site. If you do not have the password, please call the hospital operator.  12/24/2023, 9:55 AM

## 2023-12-24 NOTE — Progress Notes (Signed)
 Shared with IP team that liver biopsy was canceled today d/t elevated BP. Will place patient NPO for potential procedure tomorrow pending whether pressure <160/90 (currently SBP 200s). She may have sips with meds after MN tonight; please provide AM BP medications tomorrow AM.

## 2023-12-24 NOTE — Plan of Care (Signed)

## 2023-12-24 NOTE — Sedation Documentation (Signed)
 Dr. Philip at bedside; per Dr. Philip, unable to perform liver biopsy today due to pt's HTN

## 2023-12-25 ENCOUNTER — Ambulatory Visit: Payer: Self-pay | Admitting: Gastroenterology

## 2023-12-25 ENCOUNTER — Inpatient Hospital Stay (HOSPITAL_COMMUNITY)

## 2023-12-25 ENCOUNTER — Encounter (HOSPITAL_COMMUNITY): Payer: Self-pay | Admitting: Gastroenterology

## 2023-12-25 DIAGNOSIS — I160 Hypertensive urgency: Secondary | ICD-10-CM | POA: Diagnosis not present

## 2023-12-25 DIAGNOSIS — K449 Diaphragmatic hernia without obstruction or gangrene: Secondary | ICD-10-CM

## 2023-12-25 DIAGNOSIS — K317 Polyp of stomach and duodenum: Secondary | ICD-10-CM

## 2023-12-25 DIAGNOSIS — K2970 Gastritis, unspecified, without bleeding: Secondary | ICD-10-CM

## 2023-12-25 DIAGNOSIS — E038 Other specified hypothyroidism: Secondary | ICD-10-CM | POA: Diagnosis not present

## 2023-12-25 DIAGNOSIS — N1832 Chronic kidney disease, stage 3b: Secondary | ICD-10-CM | POA: Diagnosis not present

## 2023-12-25 DIAGNOSIS — K2990 Gastroduodenitis, unspecified, without bleeding: Secondary | ICD-10-CM

## 2023-12-25 DIAGNOSIS — K769 Liver disease, unspecified: Secondary | ICD-10-CM

## 2023-12-25 DIAGNOSIS — D649 Anemia, unspecified: Secondary | ICD-10-CM | POA: Diagnosis not present

## 2023-12-25 LAB — BASIC METABOLIC PANEL WITH GFR
Anion gap: 9 (ref 5–15)
BUN: 26 mg/dL — ABNORMAL HIGH (ref 8–23)
CO2: 23 mmol/L (ref 22–32)
Calcium: 8.3 mg/dL — ABNORMAL LOW (ref 8.9–10.3)
Chloride: 107 mmol/L (ref 98–111)
Creatinine, Ser: 1.78 mg/dL — ABNORMAL HIGH (ref 0.44–1.00)
GFR, Estimated: 29 mL/min — ABNORMAL LOW (ref >60)
Glucose, Bld: 124 mg/dL — ABNORMAL HIGH (ref 70–99)
Potassium: 4 mmol/L (ref 3.5–5.1)
Sodium: 139 mmol/L (ref 135–145)

## 2023-12-25 LAB — GLUCOSE, CAPILLARY
Glucose-Capillary: 119 mg/dL — ABNORMAL HIGH (ref 70–99)
Glucose-Capillary: 123 mg/dL — ABNORMAL HIGH (ref 70–99)

## 2023-12-25 LAB — CBC
HCT: 24.2 % — ABNORMAL LOW (ref 36.0–46.0)
Hemoglobin: 7.5 g/dL — ABNORMAL LOW (ref 12.0–15.0)
MCH: 23.9 pg — ABNORMAL LOW (ref 26.0–34.0)
MCHC: 31 g/dL (ref 30.0–36.0)
MCV: 77.1 fL — ABNORMAL LOW (ref 80.0–100.0)
Platelets: 220 K/uL (ref 150–400)
RBC: 3.14 MIL/uL — ABNORMAL LOW (ref 3.87–5.11)
RDW: 18.1 % — ABNORMAL HIGH (ref 11.5–15.5)
WBC: 6.6 K/uL (ref 4.0–10.5)
nRBC: 0 % (ref 0.0–0.2)

## 2023-12-25 LAB — SURGICAL PATHOLOGY

## 2023-12-25 MED ORDER — MIDAZOLAM HCL 2 MG/2ML IJ SOLN
INTRAMUSCULAR | Status: AC
  Filled 2023-12-25: qty 2

## 2023-12-25 MED ORDER — ISOSORBIDE MONONITRATE ER 30 MG PO TB24: 30.0000 mg | ORAL_TABLET | Freq: Every day | ORAL | 3 refills | Status: DC

## 2023-12-25 MED ORDER — AMLODIPINE BESYLATE 10 MG PO TABS: 10.0000 mg | ORAL_TABLET | Freq: Every day | ORAL | 1 refills | Status: AC

## 2023-12-25 MED ORDER — FENTANYL CITRATE (PF) 100 MCG/2ML IJ SOLN
INTRAMUSCULAR | Status: AC
  Filled 2023-12-25: qty 2

## 2023-12-25 MED ORDER — HYDRALAZINE HCL 20 MG/ML IJ SOLN
INTRAMUSCULAR | Status: AC
  Filled 2023-12-25: qty 1

## 2023-12-25 NOTE — Plan of Care (Signed)

## 2023-12-25 NOTE — Progress Notes (Signed)
 Discharge instructions reviewed with pt, her husband and her brother. Pt to follow up with her primary care MD, pt states she will call them to set up appointment, encouraged pt to talk with her PCP to update her medication list at his office as pt states she does not take some of the medications that are listed on her AVS.   Copy of instructions given to pt. Pt informed her scripts were sent to her pharmacy for pick up.  Pt will be d/c'd via wheelchair with belongings and will be escorted by hospital volunteer.   Slayden Mennenga,RN SWOT

## 2023-12-25 NOTE — Progress Notes (Signed)
 Interventional Radiology Brief Note:  [name] came to IR today for in-patient liver lesion biopsy.  Case reviewed and approved by Dr. Jenna. Her lesion was not visible on US  today and no CT scanner was available at the time to transition to CT-guided biopsy.  After discussion, patient would prefer to d/c home today and return for biopsy as as outpatient instead of prolonging her hospital stay. Dr. Raenelle aware and has placed order.  IR to follow-up with patient for scheduling as an outpatient.   Ayaka Andes, MS RD PA-C

## 2023-12-25 NOTE — Progress Notes (Signed)
 Pt to IR at this time

## 2023-12-25 NOTE — Discharge Summary (Addendum)
 PATIENT DETAILS Name: Destiny Rice Age: 78 y.o. Sex: female Date of Birth: 1945-10-13 MRN: [id number]. Admitting Physician: Subrina Sundil, MD ERE:Hjcnlm, Camellia, MD  Admit Date: 12/19/2023 Discharge date: 12/25/2023  Recommendations for Outpatient Follow-up:  Follow up with PCP in 1-2 weeks Please obtain CMP/CBC in one week Please ensure follow-up with IR for liver biopsy Please ensure follow-up with oncology.  Admitted From:  Home  Disposition: Home   Discharge Condition: good  CODE STATUS:   Code Status: Full Code   Diet recommendation:  Diet Order             Diet heart healthy/carb modified Room service appropriate? Yes; Fluid consistency: Thin  Diet effective now           Diet - low sodium heart healthy                    Brief Summary: Patient is a 78 y.o.  female with known history of chronic iron deficiency anemia, CKD stage IIIb, PAF on Eliquis , severe AS-s/p TAVR, DM-2 who presented with fatigue/shortness of breath-upon further evaluation-she was found to have worsening anemia and new liver mass.   Significant events: 9/25>> admit to [facility]   Significant studies: 9/25>> CXR: No PNA 9/25>> CT abdomen/pelvis: Hypodense mass in the left hepatic dome 5.3 x 3.1 cm, second rounded hypodense lesion within segment 7 measuring 2.3 cm. 9/26>> echo: EF 60-65%, moderate to severe mitral stenosis-prosthetic annuloplasty ring present, no aortic stenosis-TAVR valve present. 9/27>> MRI liver: 2 new liver lesions-due to suboptimal contrast timing/body habitus/motion artifact-underlying malignant neoplasm cannot be excluded.   Significant microbiology data: 9/30>> COVID PCR: Negative 9/30>> respiratory virus panel: Rhinovirus   Procedures: 9/29>> EGD: No evidence of malignancy.   Consults: GI  Brief Hospital Course: Fatigue/exertional dyspnea-likely secondary to symptomatic microcytic-iron deficiency anemia No obvious blood loss-brown stools-FOBT negative  on 9/25 This is a longstanding issue-on oral supplements at home-has had IV iron as an outpatient S/p IV iron x 2 this hospitalization Now on oral iron supplementation (per patient-has iron at home and does not need prescription) EGD on 9/29 without any major findings Patient not keen on pursuing colonoscopy-prior evaluation has been unremarkable. Continue to follow CBC closely in the outpatient setting   Newly diagnosed liver mass Concern for malignancy Unfortunately-MRI liver not diagnostic EGD negative for any malignancy Patient not keen on pursuing colonoscopy Unable to do liver biopsy 9/30 as her blood pressure was elevated-IR attempted ultrasound-guided biopsy on 10/1-unfortunately mass was not visualized on ultrasound-no CT scanners available today for biopsy.  Patient not keen on waiting in the hospital for a biopsy-discussed EGD could be done in the outpatient setting-I have placed orders for outpatient liver biopsy-IR will call patient for follow-up.  I have alerted our oncology team-discussed with Dr. Tressia will arrange for outpatient follow-up and further workup if necessary.     URI/rhinitis Secondary to rhinovirus Much better with supportive care.   On room air.   AKI on CKD stage IIIb AKI resolved Creatinine back to baseline. Continue to hold ARB/HCTZ.   HTN BP remains on the higher side Continue Coreg /hydralazine /Imdur /amlodipine  Continue to hold Avapro /HCTZ until reassessed by PCP.   DM-2 (A1c 6.8 on 9/26) CBG stable SSI while inpatient-appears to be diet controlled as an outpatient Defer further to PCP.  PAF Telemetry monitoring Coreg /amiodarone  Eliquis  on hold-last dose 9/28 AM-for liver biopsy-this can be resumed on discharge today.   HLD Statin   History of aortic stenosis-s/p TAVR Prosthetic  valve appears normal on echo   History of mitral valve repair Per echo-patient has developed moderate to severe mitral valve stenosis Have sent message  to cardiology for outpatient follow-up however patient has a existing appointment on 11/10-which she will need to keep.-   Hypothyroidism Synthroid    Class 2 Obesity: Estimated body mass index is 39.11 kg/m as calculated from the following:   Height as of this encounter: 5' 5 (1.651 m).   Weight as of this encounter: 106.6 kg.  Discharge Diagnoses:  Principal Problem:   Symptomatic anemia Active Problems:   Hypertensive urgency   Paroxysmal atrial fibrillation (HCC)   CKD stage 3b, GFR 30-44 ml/min (HCC)   Non-insulin  dependent type 2 diabetes mellitus (HCC)   Hypothyroidism   History of aortic stenosis s/p TAVR   History of CAD (coronary artery disease)   Hyperlipidemia   Peripheral artery disease   Liver lesion   Iron deficiency anemia   Hiatal hernia   Abnormal finding on GI tract imaging   Gastric polyp   Gastritis and gastroduodenitis   Discharge Instructions:  Activity:  As tolerated with Full fall precautions use walker/cane & assistance as needed   Discharge Instructions     Diet - low sodium heart healthy   Complete by: As directed    Discharge instructions   Complete by: As directed    Follow with Primary MD  Reita Locus, MD in 1-2 weeks  You will call from interventional radiology for a outpatient appointment to perform the liver biopsy, you will also get a call from oncology for a follow-up appointment.  Please keep your upcoming appointment with cardiology-you have  evidence of mitral stenosis on echocardiogram.   We have held your HCTZ/Avapro -due to slight worsening of your renal function that has since improved.  We have switched you to amlodipine .  Please talk to your primary care practitioner about this changes.  Please get a complete blood count and chemistry panel checked by your Primary MD at your next visit, and again as instructed by your Primary MD.  Get Medicines reviewed and adjusted: Please take all your medications with you for your  next visit with your Primary MD  Laboratory/radiological data: Please request your Primary MD to go over all hospital tests and procedure/radiological results at the follow up, please ask your Primary MD to get all Hospital records sent to his/her office.  In some cases, they will be blood work, cultures and biopsy results pending at the time of your discharge. Please request that your primary care M.D. follows up on these results.  Also Note the following: If you experience worsening of your admission symptoms, develop shortness of breath, life threatening emergency, suicidal or homicidal thoughts you must seek medical attention immediately by calling 911 or calling your MD immediately  if symptoms less severe.  You must read complete instructions/literature along with all the possible adverse reactions/side effects for all the Medicines you take and that have been prescribed to you. Take any new Medicines after you have completely understood and accpet all the possible adverse reactions/side effects.   Do not drive when taking Pain medications or sleeping medications (Benzodaizepines)  Do not take more than prescribed Pain, Sleep and Anxiety Medications. It is not advisable to combine anxiety,sleep and pain medications without talking with your primary care practitioner  Special Instructions: If you have smoked or chewed Tobacco  in the last 2 yrs please stop smoking, stop any regular Alcohol  and or any Recreational drug  use.  Wear Seat belts while driving.  Please note: You were cared for by a hospitalist during your hospital stay. Once you are discharged, your primary care physician will handle any further medical issues. Please note that NO REFILLS for any discharge medications will be authorized once you are discharged, as it is imperative that you return to your primary care physician (or establish a relationship with a primary care physician if you do not have one) for your post  hospital discharge needs so that they can reassess your need for medications and monitor your lab values.   Increase activity slowly   Complete by: As directed       Allergies as of 12/25/2023       Reactions   Oseltamivir Other (See Comments), Swelling   In legs   Dapagliflozin    Other Reaction(s): UTI's   Iodine -131 Nausea Only   Metformin Hcl    Other Reaction(s): stopped due to kidney function   Sulfa Antibiotics Hives        Medication List     STOP taking these medications    potassium chloride  SA 20 MEQ tablet Commonly known as: Klor-Con  M20   valsartan -hydrochlorothiazide  160-25 MG tablet Commonly known as: DIOVAN -HCT       TAKE these medications    ALPRAZolam  0.25 MG tablet Commonly known as: XANAX  Take 0.25 mg by mouth at bedtime as needed for anxiety.   amiodarone  200 MG tablet Commonly known as: PACERONE  TAKE 1 TABLET BY MOUTH DAILY   amLODipine  10 MG tablet Commonly known as: NORVASC  Take 1 tablet (10 mg total) by mouth daily. Start taking on: December 26, 2023   atorvastatin  40 MG tablet Commonly known as: Lipitor Take 1 tablet (40 mg total) by mouth daily.   busPIRone 5 MG tablet Commonly known as: BUSPAR Take 5 mg by mouth daily as needed (for anxiety).   CALCIUM  PO Take 1 tablet by mouth daily.   carvedilol  25 MG tablet Commonly known as: COREG  Take 1 tablet (25 mg total) by mouth 2 (two) times daily with a meal.   cholecalciferol 25 MCG (1000 UNIT) tablet Commonly known as: VITAMIN D3 Take 1,000 Units by mouth daily.   Eliquis  5 MG Tabs tablet Generic drug: apixaban  TAKE 1 TABLET(5 MG) BY MOUTH TWICE DAILY What changed: Another medication with the same name was removed. Continue taking this medication, and follow the directions you see here.   folic acid  400 MCG tablet Commonly known as: FOLVITE  Take 400 mcg by mouth daily.   gabapentin  100 MG capsule Commonly known as: Neurontin  Take 1 capsule (100 mg total) by mouth 3  (three) times daily.   hydrALAZINE  50 MG tablet Commonly known as: APRESOLINE  TAKE 1 TABLET BY MOUTH TWICE DAILY   IRON PO Take 2.5 mLs by mouth daily as needed (for energy). Liquid Iron   isosorbide  mononitrate 30 MG 24 hr tablet Commonly known as: IMDUR  Take 1 tablet (30 mg total) by mouth daily. What changed: Another medication with the same name was removed. Continue taking this medication, and follow the directions you see here.   levothyroxine  25 MCG tablet Commonly known as: SYNTHROID  Take 1 tablet (25 mcg total) by mouth daily before breakfast.   meclizine  12.5 MG tablet Commonly known as: ANTIVERT  Take 1 tablet (12.5 mg total) by mouth 3 (three) times daily as needed for dizziness.   nitroGLYCERIN  0.4 MG SL tablet Commonly known as: NITROSTAT  Place 1 tablet (0.4 mg total) under the tongue every  5 (five) minutes as needed for chest pain.   pantoprazole  40 MG tablet Commonly known as: PROTONIX  Take 1 tablet (40 mg total) by mouth 2 (two) times daily before a meal.   Vitamin B-12 5000 MCG Tbdp Take 5,000 mcg by mouth daily.        Follow-up Information     Reita Locus, MD. Schedule an appointment as soon as possible for a visit in 1 week(s).   Specialty: Internal Medicine Contact information: [address] [place] [address] [phone]         [facility] INTERVENTIONAL RADIOLOGY Follow up.   Specialty: Radiology Why: Office will call with date/time, If you dont hear from them,please give them a call Contact information: [address] [place]   [address] [phone]        Cloretta Arley NOVAK, MD Follow up.   Specialty: Oncology Why: Office will call with date/time, If you dont hear from them,please give them a call Contact information: [address] [place] [address] [phone]                Allergies  Allergen Reactions   Oseltamivir Other (See Comments) and Swelling     In legs   Dapagliflozin     Other Reaction(s): UTI's   Iodine -131 Nausea Only   Metformin Hcl     Other Reaction(s): stopped due to kidney function   Sulfa Antibiotics Hives     Other Procedures/Studies: MR LIVER W WO CONTRAST Result Date: 12/22/2023 EXAM: MRI ABDOMEN 12/21/2023 05:28:49 PM TECHNIQUE: Multiplanar multisequence MRI of the abdomen was performed without and with the administration of 10 mL gadobutrol (GADAVIST) 1 MMOL/ML injection. The exam detail is diminished due to motion artifact and patient body habitus. COMPARISON: None available. CLINICAL HISTORY: Liver lesion, > 1cm; Liver mass rule out HCC. Liver lesion, > 1cm;; 10 ml gadavist FINDINGS: LIVER: Within the dome of the left lobe of the liver (segment 4a), there is a lobulated mass measuring 4.2 x 3.2 x 2.5 cm (axial image 12/4). This is T2 hyperintense and T1 hypointense. The branch of the hepatic vein to segment 4a is encased by this structure, which is a feature of malignancy (axial image 12/4). Unfortunately, the post-contrast images for this lesion are suboptimal due to motion artifact and timing of the contrast bolus. Additionally, reflux of contrast material into the hepatic veins diminishes the target to background ratio. Within the posterior right lobe of the liver, there is a second lesion which appears round and well-circumscribed, measuring 2.5 x 2.1 cm (image 10/4). This is also T2 hyperintense and T1 hypointense. Enhancement characteristics of this lesion are also difficult to characterize due to motion artifact and suboptimal timing of the contrast bolus. There appears to be hypoenhancement within this structure on the delayed phase images, which is a nonspecific feature. Both of these lesions were not present on the examination from 03/10/2020. Additionally, both lesions show restricted diffusion concerning for malignancy. GALLBLADDER AND BILIARY SYSTEM: Gallbladder appears normal. No bile duct dilatation. SPLEEN:  Unremarkable. PANCREAS: Unremarkable. ADRENAL GLANDS: Unremarkable. KIDNEYS: Bilateral simple-appearing kidney cysts. The largest is in the anterior cortex of the left kidney, measuring 3.6 cm (image 23/4). No follow-up imaging recommended. LYMPH NODES: No lymphadenopathy. VASCULATURE: Aortic atherosclerotic calcification. PERITONEUM: No free fluid or fluid collections. BOWEL: Moderate-sized hiatal hernia. There is focal wall thickening within the sliding hiatal hernia image 17/3, which is suboptimally evaluated on the current exam due to motion. No pathologic dilatation of the bowel  loops. ABDOMINAL WALL: No acute abnormality. BONES: No acute abnormality. IMPRESSION: 1. Two new liver lesions (left lobe 4.2 x 3.2 x 2.5 cm; posterior right lobe 2.5 x 2.1 cm) that are T2 hyperintense, T1 hypointense, and show restricted diffusion. Evaluation is limited by motion artifact, patient body habitus and suboptimal contrast timing. These remain suboptimally characterized, and an underlying malignant neoplasm cannot be excluded. Recommend more definitive characterization with dedicated liver protocol CT without and with contrast material. 2. Gallstones. 3. Sliding-type hiatal hernia as noted on the CT from 12/19/2022. There is focal wall thickening in this area, which is nonspecific and difficult to further characterize due to motion artifact and suboptimal imaging post-contrast images. This can also be readdressed at the time of the contrast-enhanced liver protocol CT. Electronically signed by: Waddell Calk MD 12/22/2023 06:08 AM EDT RP Workstation: [id number]   ECHOCARDIOGRAM COMPLETE Result Date: 12/20/2023    ECHOCARDIOGRAM REPORT   Patient Name:   Destiny Rice Date of Exam: 12/20/2023 Medical Rec #:  [id number]      Height:       65.0 in Accession #:    [id number]     Weight:       235.0 lb Date of Birth:  June 23, 1945      BSA:          2.118 m Patient Age:    78 years       BP:           144/60 mmHg Patient Gender: F               HR:           63 bpm. Exam Location:  Inpatient Procedure: 2D Echo, Cardiac Doppler and Color Doppler (Both Spectral and Color            Flow Doppler were utilized during procedure). Indications:    Elevated brain natriuretic peptide (BNP) level [[id number]]  History:        Patient has prior history of Echocardiogram examinations, most                 recent 04/23/2022. CAD; Risk Factors:Hypertension, Diabetes and                 Dyslipidemia.                 Aortic Valve: 26 mm CoreValve-Evolut Pro prosthetic, stented                 (TAVR) valve is present in the aortic position. Procedure Date:                 04/24/2018.                 Mitral Valve: prosthetic annuloplasty ring valve is present in                 the mitral position. Procedure Date: 12/09/2012.  Sonographer:    Philomena Daring Referring Phys: [id number] SUBRINA SUNDIL IMPRESSIONS  1. Left ventricular ejection fraction, by estimation, is 60 to 65%. The left ventricle has normal function. The left ventricle has no regional wall motion abnormalities. There is mild left ventricular hypertrophy. Left ventricular diastolic parameters are indeterminate. There is the interventricular septum is flattened in systole and diastole, consistent with right ventricular pressure and volume overload.  2. Right ventricular systolic function is normal. The right ventricular size is normal. There is normal pulmonary artery systolic pressure. The estimated right ventricular systolic pressure is  30.1 mmHg.  3. The mitral valve has been repaired/replaced. No evidence of mitral valve regurgitation. 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 The mean mitral valve gradient is 12.0 mmHg. There is a prosthetic annuloplasty ring present in the mitral position. Procedure  Date: 12/09/2012.  4. The aortic valve has been repaired/replaced. Aortic valve regurgitation is not visualized. No aortic stenosis is present. There is a 26 mm CoreValve-Evolut Pro prosthetic (TAVR) valve present  in the aortic position. Procedure Date: 04/24/2018.  5. The inferior vena cava is dilated in size with <50% respiratory variability, suggesting right atrial pressure of 15 mmHg. FINDINGS  Left Ventricle: Left ventricular ejection fraction, by estimation, is 60 to 65%. The left ventricle has normal function. The left ventricle has no regional wall motion abnormalities. The left ventricular internal cavity size was normal in size. There is  mild left ventricular hypertrophy. The interventricular septum is flattened in systole and diastole, consistent with right ventricular pressure and volume overload. Left ventricular diastolic parameters are indeterminate. Right Ventricle: The right ventricular size is normal. No increase in right ventricular wall thickness. Right ventricular systolic function is normal. There is normal pulmonary artery systolic pressure. The tricuspid regurgitant velocity is 1.94 m/s, and  with an assumed right atrial pressure of 15 mmHg, the estimated right ventricular systolic pressure is 30.1 mmHg. Left Atrium: Left atrial size was normal in size. Right Atrium: Right atrial size was normal in size. Pericardium: There is no evidence of pericardial effusion. Mitral Valve: The mitral valve has been repaired/replaced. No evidence of mitral valve regurgitation. There is a prosthetic annuloplasty ring present in the mitral position. Procedure Date: 12/09/2012. Moderate to severe mitral valve stenosis. MV peak gradient, 26.4 mmHg. The mean mitral valve gradient is 12.0 mmHg. Tricuspid Valve: The tricuspid valve is normal in structure. Tricuspid valve regurgitation is mild . No evidence of tricuspid stenosis. Aortic Valve: The aortic valve has been repaired/replaced. Aortic valve regurgitation is not visualized. No aortic stenosis is present. There is a 26 mm CoreValve-Evolut Pro prosthetic, stented (TAVR) valve present in the aortic position. Procedure Date:  04/24/2018. Pulmonic Valve: The pulmonic valve  was normal in structure. Pulmonic valve regurgitation is not visualized. No evidence of pulmonic stenosis. Aorta: The aortic root is normal in size and structure. Venous: The inferior vena cava is dilated in size with less than 50% respiratory variability, suggesting right atrial pressure of 15 mmHg. IAS/Shunts: No atrial level shunt detected by color flow Doppler.  LEFT VENTRICLE PLAX 2D LVIDd:         4.20 cm   Diastology LVIDs:         2.70 cm   LV e' medial:  5.87 cm/s LV PW:         1.20 cm   LV e' lateral: 5.98 cm/s LV IVS:        1.30 cm LVOT diam:     1.70 cm LV SV:         51 LV SV Index:   24 LVOT Area:     2.27 cm  RIGHT VENTRICLE            IVC RV S prime:     8.16 cm/s  IVC diam: 2.40 cm TAPSE (M-mode): 1.7 cm LEFT ATRIUM             Index        RIGHT ATRIUM           Index LA Vol (A2C):   60.2 ml 28.42 ml/m  RA Area:     15.40 cm LA Vol (A4C):   56.0 ml 26.43 ml/m  RA Volume:   37.70 ml  17.80 ml/m LA Biplane Vol: 62.0 ml 29.27 ml/m  AORTIC VALVE LVOT Vmax:   90.20 cm/s LVOT Vmean:  61.100 cm/s LVOT VTI:    0.223 m  AORTA Ao Asc diam: 2.40 cm MITRAL VALVE              TRICUSPID VALVE MV Area VTI:  0.73 cm    TR Peak grad:   15.1 mmHg MV Peak grad: 26.4 mmHg   TR Vmax:        194.00 cm/s MV Mean grad: 12.0 mmHg MV Vmax:      2.57 m/s    SHUNTS MV Vmean:     167.0 cm/s  Systemic VTI:  0.22 m                           Systemic Diam: 1.70 cm Oneil Parchment MD Electronically signed by Oneil Parchment MD Signature Date/Time: 12/20/2023/10:37:58 AM    Final    CT ABDOMEN PELVIS WO CONTRAST Result Date: 12/19/2023 EXAM: CT ABDOMEN AND PELVIS WITHOUT CONTRAST 12/19/2023 07:53:00 PM TECHNIQUE: CT of the abdomen and pelvis was performed without the administration of intravenous contrast. Multiplanar reformatted images are provided for review. Automated exposure control, iterative reconstruction, and/or weight-based adjustment of the mA/kV was utilized to reduce the radiation dose to as low as reasonably  achievable. COMPARISON: Comparison is made to March 10, 2020. CLINICAL HISTORY: Abdominal pain, acute, nonlocalized; ABD PAIN, low hgb, ckd. Chief complaints; Shortness of Breath; CT ABDOMEN PELVIS WO CONTRAST; Abdominal pain, acute, nonlocalized, ABD PAIN, low hgb, ckd. FINDINGS: LOWER CHEST: Hypoattenuation of the cardiac blood pool in keeping with at least moderate anemia. Moderate hiatal hernia. LIVER: Interval development of a lobulated hypodense mass within the left hepatic dome measuring 5.3 x 2.5 is not mentioned but a 5.3 x 3.1 cm mass at image 10/3 and a second rounded hypodense lesion within segment 7 measuring 2.3 cm at image 17/3. These are indeterminate on this noncontrast examination. GALLBLADDER AND BILE DUCTS: Cholelithiasis without superimposed pericholecystic inflammatory change. No intra or extrahepatic biliary ductal dilation. SPLEEN: No acute abnormality. PANCREAS: No acute abnormality. ADRENAL GLANDS: No acute abnormality. KIDNEYS, URETERS AND BLADDER: Simple cortical cysts are seen within the kidneys bilaterally for which no follow-up imaging is recommended. Vascular calcifications are noted within the renal hila bilaterally. No ureteral calculi. No hydronephrosis. No perinephric inflammatory stranding or fluid collections were identified. The bladder is unremarkable. GI AND BOWEL: Colonic diverticulosis without superimposed acute inflammatory change. The stomach, small bowel, and large bowel are otherwise unremarkable. Appendix is normal. PERITONEUM AND RETROPERITONEUM: No ascites. No free air. VASCULATURE: Extensive aortoiliac atherosclerotic calcification. Particularly prominent atherosclerotic calcifications at the origin of the mesenteric vasculature. LYMPH NODES: No lymphadenopathy. REPRODUCTIVE ORGANS: Status post hysterectomy. No adnexal mass. BONES AND SOFT TISSUES: Osseous structures are age appropriate. No acute bone abnormality. No lytic or blastic bone lesion. IMPRESSION: 1.  Interval development of a lobulated hypodense mass within the left hepatic dome measuring 5.3 x 3.1 cm and a second rounded hypodense lesion within segment 7 measuring 2.3 cm, indeterminate on this noncontrast examination. 2. Cholelithiasis without superimposed pericholecystic inflammatory change. No intra or extrahepatic biliary ductal dilation. 3. Colonic diverticulosis without superimposed acute inflammatory change. 4. Moderate hiatal hernia. 5. Peripheral vascular disease. Particularly prominent atherosclerotic calcifications at the origin of the  mesenteric vasculature. If there is clinical evidence of acute or chronic mesenteric ischemia, CT arteriography may be more helpful for further evaluation. Electronically signed by: Dorethia Molt MD 12/19/2023 08:05 PM EDT RP Workstation: [id number]   DG Chest 2 View Result Date: 12/19/2023 CLINICAL DATA:  Shortness of breath. EXAM: CHEST - 2 VIEW COMPARISON:  11/22/2022 FINDINGS: Borderline cardiomegaly. No pulmonary vascular congestion. Postsurgical changes of valve replacement are seen. Lungs are clear. IMPRESSION: Borderline cardiomegaly. Electronically Signed   By: Aliene Lloyd M.D.   On: 12/19/2023 17:05     TODAY-DAY OF DISCHARGE:  Subjective:   Destiny Rice today has no headache,no chest abdominal pain,no new weakness tingling or numbness, feels much better wants to go home today.   Objective:   Blood pressure (!) 142/50, pulse 77, temperature 98.5 F (36.9 C), temperature source Oral, resp. rate 19, height 5' 5 (1.651 m), weight 106.6 kg, SpO2 100%.  Intake/Output Summary (Last 24 hours) at 12/25/2023 1200 Last data filed at 12/24/2023 1944 Gross per 24 hour  Intake 480 ml  Output --  Net 480 ml   Filed Weights   12/19/23 1536  Weight: 106.6 kg    Exam: Awake Alert, Oriented *3, No new F.N deficits, Normal affect Donalds.AT,PERRAL Supple Neck,No JVD, No cervical lymphadenopathy appriciated.  Symmetrical Chest wall movement, Good air  movement bilaterally, CTAB RRR,No Gallops,Rubs or new Murmurs, No Parasternal Heave +ve B.Sounds, Abd Soft, Non tender, No organomegaly appriciated, No rebound -guarding or rigidity. No Cyanosis, Clubbing or edema, No new Rash or bruise   PERTINENT RADIOLOGIC STUDIES: No results found.   PERTINENT LAB RESULTS: CBC: Recent Labs    12/24/23 0250 12/25/23 0237  WBC 6.4 6.6  HGB 8.0* 7.5*  HCT 26.2* 24.2*  PLT 262 220   CMET CMP     Component Value Date/Time   NA 139 12/25/2023 0237   NA 140 11/15/2022 0900   K 4.0 12/25/2023 0237   CL 107 12/25/2023 0237   CO2 23 12/25/2023 0237   GLUCOSE 124 (H) 12/25/2023 0237   BUN 26 (H) 12/25/2023 0237   BUN 20 11/15/2022 0900   CREATININE 1.78 (H) 12/25/2023 0237   CALCIUM  8.3 (L) 12/25/2023 0237   PROT 6.0 (L) 12/20/2023 0327   ALBUMIN 3.0 (L) 12/20/2023 0327   AST 27 12/20/2023 0327   ALT 23 12/20/2023 0327   ALKPHOS 52 12/20/2023 0327   BILITOT 0.9 12/20/2023 0327   GFR 26.34 (L) 07/30/2022 1530   EGFR 33 (L) 11/15/2022 0900   GFRNONAA 29 (L) 12/25/2023 0237    GFR Estimated Creatinine Clearance: 31.1 mL/min (A) (by C-G formula based on SCr of 1.78 mg/dL (H)). No results for input(s): LIPASE, AMYLASE in the last 72 hours. No results for input(s): CKTOTAL, CKMB, CKMBINDEX, TROPONINI in the last 72 hours. Invalid input(s): POCBNP No results for input(s): DDIMER in the last 72 hours. No results for input(s): HGBA1C in the last 72 hours. No results for input(s): CHOL, HDL, LDLCALC, TRIG, CHOLHDL, LDLDIRECT in the last 72 hours. No results for input(s): TSH, T4TOTAL, T3FREE, THYROIDAB in the last 72 hours.  Invalid input(s): FREET3 No results for input(s): VITAMINB12, FOLATE, FERRITIN, TIBC, IRON, RETICCTPCT in the last 72 hours. Coags: Recent Labs    12/22/23 1528 12/24/23 0250  INR 1.6* 1.2   Microbiology: Recent Results (from the past 240 hours)  Respiratory  (~20 pathogens) panel by PCR     Status: Abnormal   Collection Time: 12/24/23  7:57 AM   Specimen:  Nasopharyngeal Swab; Respiratory  Result Value Ref Range Status   Adenovirus NOT DETECTED NOT DETECTED Final   Coronavirus 229E NOT DETECTED NOT DETECTED Final    Comment: (NOTE) The Coronavirus on the Respiratory Panel, DOES NOT test for the novel  Coronavirus (2019 nCoV)    Coronavirus HKU1 NOT DETECTED NOT DETECTED Final   Coronavirus NL63 NOT DETECTED NOT DETECTED Final   Coronavirus OC43 NOT DETECTED NOT DETECTED Final   Metapneumovirus NOT DETECTED NOT DETECTED Final   Rhinovirus / Enterovirus DETECTED (A) NOT DETECTED Final   Influenza A NOT DETECTED NOT DETECTED Final   Influenza B NOT DETECTED NOT DETECTED Final   Parainfluenza Virus 1 NOT DETECTED NOT DETECTED Final   Parainfluenza Virus 2 NOT DETECTED NOT DETECTED Final   Parainfluenza Virus 3 NOT DETECTED NOT DETECTED Final   Parainfluenza Virus 4 NOT DETECTED NOT DETECTED Final   Respiratory Syncytial Virus NOT DETECTED NOT DETECTED Final   Bordetella pertussis NOT DETECTED NOT DETECTED Final   Bordetella Parapertussis NOT DETECTED NOT DETECTED Final   Chlamydophila pneumoniae NOT DETECTED NOT DETECTED Final   Mycoplasma pneumoniae NOT DETECTED NOT DETECTED Final    Comment: Performed at [facility], [address], [place], [place] [address]  SARS Coronavirus 2 by RT PCR (hospital order, performed in [facility] hospital lab) *cepheid single result test* Anterior Nasal Swab     Status: None   Collection Time: 12/24/23  7:57 AM   Specimen: Anterior Nasal Swab  Result Value Ref Range Status   SARS Coronavirus 2 by RT PCR NEGATIVE NEGATIVE Final    Comment: Performed at [facility], [address] [place], [place] [address]    FURTHER DISCHARGE INSTRUCTIONS:  Get Medicines reviewed and adjusted: Please take all your medications with you for your next visit with your Primary MD  Laboratory/radiological  data: Please request your Primary MD to go over all hospital tests and procedure/radiological results at the follow up, please ask your Primary MD to get all Hospital records sent to his/her office.  In some cases, they will be blood work, cultures and biopsy results pending at the time of your discharge. Please request that your primary care M.D. goes through all the records of your hospital data and follows up on these results.  Also Note the following: If you experience worsening of your admission symptoms, develop shortness of breath, life threatening emergency, suicidal or homicidal thoughts you must seek medical attention immediately by calling 911 or calling your MD immediately  if symptoms less severe.  You must read complete instructions/literature along with all the possible adverse reactions/side effects for all the Medicines you take and that have been prescribed to you. Take any new Medicines after you have completely understood and accpet all the possible adverse reactions/side effects.   Do not drive when taking Pain medications or sleeping medications (Benzodaizepines)  Do not take more than prescribed Pain, Sleep and Anxiety Medications. It is not advisable to combine anxiety,sleep and pain medications without talking with your primary care practitioner  Special Instructions: If you have smoked or chewed Tobacco  in the last 2 yrs please stop smoking, stop any regular Alcohol  and or any Recreational drug use.  Wear Seat belts while driving.  Please note: You were cared for by a hospitalist during your hospital stay. Once you are discharged, your primary care physician will handle any further medical issues. Please note that NO REFILLS for any discharge medications will be authorized  once you are discharged, as it is imperative that you return to your primary care physician (or establish a relationship with a primary care physician if you do not have one) for your post hospital  discharge needs so that they can reassess your need for medications and monitor your lab values.  Total Time spent coordinating discharge including counseling, education and face to face time equals greater than 30 minutes.  Signed: Joyceline Maiorino 12/25/2023 12:00 PM

## 2023-12-25 NOTE — Sedation Documentation (Signed)
 Dr Jenna unable to perform procedure in ultrasound.

## 2023-12-25 NOTE — Progress Notes (Signed)
 PROGRESS NOTE        PATIENT DETAILS Name: Destiny Rice Age: 78 y.o. Sex: female Date of Birth: 06/06/1945 Admit Date: 12/19/2023 Admitting Physician Micaela Speaker, MD ERE:[email or web address], Camellia, MD  Brief Summary: Patient is a 78 y.o.  female with known history of chronic iron deficiency anemia, CKD stage IIIb, PAF on Eliquis , severe AS-s/p TAVR, DM-2 who presented with fatigue/shortness of breath-upon further evaluation-she was found to have worsening anemia and new liver mass.  Significant events: 9/25>> admit to [facility]  Significant studies: 9/25>> CXR: No PNA 9/25>> CT abdomen/pelvis: Hypodense mass in the left hepatic dome 5.3 x 3.1 cm, second rounded hypodense lesion within segment 7 measuring 2.3 cm. 9/26>> echo: EF 60-65%, moderate to severe mitral stenosis-prosthetic annuloplasty ring present, no aortic stenosis-TAVR valve present. 9/27>> MRI liver: 2 new liver lesions-due to suboptimal contrast timing/body habitus/motion artifact-underlying malignant neoplasm cannot be excluded.  Significant microbiology data: 9/30>> COVID PCR: Negative 9/30>> respiratory virus panel: Rhinovirus  Procedures: 9/29>> EGD: No evidence of malignancy.  Consults: GI  Subjective: Has developed some URI symptoms-cough-nasal congestion.  However no other complaints.  Objective: Vitals: Blood pressure (!) 164/65, pulse 71, temperature 99.2 F (37.3 C), temperature source Oral, resp. rate 19, height 5' 5 (1.651 m), weight 106.6 kg, SpO2 95%.   Exam: Gen Exam:Alert awake-not in any distress HEENT:atraumatic, normocephalic Chest: B/L clear to auscultation anteriorly CVS:S1S2 regular Abdomen:soft non tender, non distended Extremities:no edema Neurology: Non focal Skin: no rash  Pertinent Labs/Radiology:    Latest Ref Rng & Units 12/25/2023    2:37 AM 12/24/2023    2:50 AM 12/23/2023    2:48 AM  CBC  WBC 4.0 - 10.5 K/uL 6.6  6.4  6.8   Hemoglobin 12.0 - 15.0 g/dL 7.5   8.0  7.6   Hematocrit 36.0 - 46.0 % 24.2  26.2  24.1   Platelets 150 - 400 K/uL 220  262  246     Lab Results  Component Value Date   NA 139 12/25/2023   K 4.0 12/25/2023   CL 107 12/25/2023   CO2 23 12/25/2023      Assessment/Plan: Fatigue/exertional dyspnea-likely secondary to symptomatic microcytic-iron deficiency anemia No obvious blood loss-brown stools-FOBT negative on 9/25 This is a longstanding issue-on oral supplements at home-has had IV iron as an outpatient S/p IV iron x 2 this hospitalization Now on oral iron supplementation EGD on 9/29 without any major findings Patient not keen on pursuing colonoscopy-prior evaluation has been unremarkable. Follow CBC.  Newly diagnosed liver mass Concern for malignancy Unfortunately-MRI liver not diagnostic EGD negative for any malignancy Patient not keen on pursuing colonoscopy Unable to do liver biopsy yesterday-due to blood pressure issues-hoping that this can be performed today.  URI/rhinitis Secondary to rhinovirus Much better with supportive care.   On room air.  AKI on CKD stage IIIb AKI resolved Creatinine back to baseline. Continue to hold ARB/HCTZ.  HTN BP remains on the higher side Continue Coreg /hydralazine /Imdur /amlodipine  Continue to hold Avapro /HCTZ.    DM-2 (A1c 6.8 on 9/26) CBG stable SSI  Recent Labs    12/24/23 1626 12/24/23 2118 12/25/23 0813  GLUCAP 140* 161* 119*     PAF Telemetry monitoring Coreg /amiodarone  Eliquis  on hold-last dose 9/28 AM.  HLD Statin  History of aortic stenosis-s/p TAVR Prosthetic valve appears normal on echo  History of mitral valve repair  Per echo-patient has developed moderate to severe mitral valve stenosis Will require outpatient cardiology evaluation.  Hypothyroidism Synthroid   Class 2 Obesity: Estimated body mass index is 39.11 kg/m as calculated from the following:   Height as of this encounter: 5' 5 (1.651 m).   Weight as of this  encounter: 106.6 kg.   Code status:   Code Status: Full Code   DVT Prophylaxis: SCDs Start: 12/19/23 2341 Place TED hose Start: 12/19/23 2341     Family Communication: None at bedside   Disposition Plan: Status is: Inpatient Remains inpatient appropriate because: Severity of illness   Planned Discharge Destination:Home   Diet: Diet Order             Diet NPO time specified Except for: Sips with Meds  Diet effective midnight                     Antimicrobial agents: Anti-infectives (From admission, onward)    None        MEDICATIONS: Scheduled Meds:  amiodarone   200 mg Oral Daily   amLODipine   10 mg Oral Daily   atorvastatin   20 mg Oral Daily   carvedilol   25 mg Oral BID WC   ferrous sulfate   325 mg Oral Q breakfast   fluticasone  2 spray Each Nare Daily   folic acid   500 mcg Oral Daily   gabapentin   100 mg Oral TID   hydrALAZINE   50 mg Oral BID   insulin  aspart  0-9 Units Subcutaneous TID WC   isosorbide  mononitrate  30 mg Oral Daily   levothyroxine   25 mcg Oral Q0600   loratadine  10 mg Oral Daily   oxymetazoline  1 spray Each Nare BID   pantoprazole   40 mg Oral BID AC   polyethylene glycol  17 g Oral Daily   sodium chloride  flush  3 mL Intravenous Q12H   sodium chloride  flush  3 mL Intravenous Q12H   Continuous Infusions:   PRN Meds:.acetaminophen  **OR** acetaminophen , ALPRAZolam , alum & mag hydroxide-simeth, hydrALAZINE , labetalol , meclizine , metoCLOPramide  (REGLAN ) injection, sodium chloride  flush   I have personally reviewed following labs and imaging studies  LABORATORY DATA: CBC: Recent Labs  Lab 12/19/23 1612 12/20/23 0327 12/21/23 0219 12/22/23 0356 12/23/23 0248 12/24/23 0250 12/25/23 0237  WBC 6.4   < > 6.0 5.8 6.8 6.4 6.6  NEUTROABS 4.9  --   --   --   --   --   --   HGB 8.0*   < > 7.2* 7.5* 7.6* 8.0* 7.5*  HCT 27.1*   < > 23.1* 24.2* 24.1* 26.2* 24.2*  MCV 78.3*   < > 76.7* 76.1* 77.7* 77.1* 77.1*  PLT 290   < > 242  245 246 262 220   < > = values in this interval not displayed.    Basic Metabolic Panel: Recent Labs  Lab 12/21/23 0219 12/22/23 0356 12/23/23 0248 12/24/23 0250 12/25/23 0237  NA 137 138 137 138 139  K 3.7 3.6 3.6 3.4* 4.0  CL 107 107 107 107 107  CO2 22 22 21* 22 23  GLUCOSE 92 97 108* 93 124*  BUN 24* 27* 31* 23 26*  CREATININE 1.68* 1.94* 2.21* 1.62* 1.78*  CALCIUM  8.2* 8.3* 8.2* 8.4* 8.3*    GFR: Estimated Creatinine Clearance: 31.1 mL/min (A) (by C-G formula based on SCr of 1.78 mg/dL (H)).  Liver Function Tests: Recent Labs  Lab 12/19/23 1612 12/20/23 0327  AST 31 27  ALT 28  23  ALKPHOS 57 52  BILITOT 0.6 0.9  PROT 6.6 6.0*  ALBUMIN 3.3* 3.0*   No results for input(s): LIPASE, AMYLASE in the last 168 hours. No results for input(s): AMMONIA in the last 168 hours.  Coagulation Profile: Recent Labs  Lab 12/22/23 1528 12/24/23 0250  INR 1.6* 1.2    Cardiac Enzymes: No results for input(s): CKTOTAL, CKMB, CKMBINDEX, TROPONINI in the last 168 hours.  BNP (last 3 results) No results for input(s): PROBNP in the last 8760 hours.  Lipid Profile: No results for input(s): CHOL, HDL, LDLCALC, TRIG, CHOLHDL, LDLDIRECT in the last 72 hours.  Thyroid  Function Tests: No results for input(s): TSH, T4TOTAL, FREET4, T3FREE, THYROIDAB in the last 72 hours.  Anemia Panel: No results for input(s): VITAMINB12, FOLATE, FERRITIN, TIBC, IRON, RETICCTPCT in the last 72 hours.   Urine analysis:    Component Value Date/Time   COLORURINE YELLOW 03/10/2020 0330   APPEARANCEUR CLEAR 03/10/2020 0330   LABSPEC 1.012 03/10/2020 0330   PHURINE 6.0 03/10/2020 0330   GLUCOSEU NEGATIVE 03/10/2020 0330   HGBUR NEGATIVE 03/10/2020 0330   BILIRUBINUR NEGATIVE 03/10/2020 0330   KETONESUR NEGATIVE 03/10/2020 0330   PROTEINUR NEGATIVE 03/10/2020 0330   NITRITE NEGATIVE 03/10/2020 0330   LEUKOCYTESUR NEGATIVE 03/10/2020 0330     Sepsis Labs: Lactic Acid, Venous No results found for: LATICACIDVEN  MICROBIOLOGY: Recent Results (from the past 240 hours)  Respiratory (~20 pathogens) panel by PCR     Status: Abnormal   Collection Time: 12/24/23  7:57 AM   Specimen: Nasopharyngeal Swab; Respiratory  Result Value Ref Range Status   Adenovirus NOT DETECTED NOT DETECTED Final   Coronavirus 229E NOT DETECTED NOT DETECTED Final    Comment: (NOTE) The Coronavirus on the Respiratory Panel, DOES NOT test for the novel  Coronavirus (2019 nCoV)    Coronavirus HKU1 NOT DETECTED NOT DETECTED Final   Coronavirus NL63 NOT DETECTED NOT DETECTED Final   Coronavirus OC43 NOT DETECTED NOT DETECTED Final   Metapneumovirus NOT DETECTED NOT DETECTED Final   Rhinovirus / Enterovirus DETECTED (A) NOT DETECTED Final   Influenza A NOT DETECTED NOT DETECTED Final   Influenza B NOT DETECTED NOT DETECTED Final   Parainfluenza Virus 1 NOT DETECTED NOT DETECTED Final   Parainfluenza Virus 2 NOT DETECTED NOT DETECTED Final   Parainfluenza Virus 3 NOT DETECTED NOT DETECTED Final   Parainfluenza Virus 4 NOT DETECTED NOT DETECTED Final   Respiratory Syncytial Virus NOT DETECTED NOT DETECTED Final   Bordetella pertussis NOT DETECTED NOT DETECTED Final   Bordetella Parapertussis NOT DETECTED NOT DETECTED Final   Chlamydophila pneumoniae NOT DETECTED NOT DETECTED Final   Mycoplasma pneumoniae NOT DETECTED NOT DETECTED Final    Comment: Performed at [facility], [address] [place], [place] [address]  SARS Coronavirus 2 by RT PCR (hospital order, performed in [facility] hospital lab) *cepheid single result test* Anterior Nasal Swab     Status: None   Collection Time: 12/24/23  7:57 AM   Specimen: Anterior Nasal Swab  Result Value Ref Range Status   SARS Coronavirus 2 by RT PCR NEGATIVE NEGATIVE Final    Comment: Performed at [facility], [address], [place], [place] [address]    RADIOLOGY STUDIES/RESULTS: No  results found.    LOS: 6 days   Donalda Applebaum, MD  [facility]    To contact the attending provider between 7A-7P or the covering provider during after hours 7P-7A, please log into the  web site [email or web address] and access using universal [place] password for that web site. If you do not have the password, please call the hospital operator.  12/25/2023, 9:56 AM

## 2023-12-26 ENCOUNTER — Encounter (HOSPITAL_COMMUNITY): Payer: Self-pay

## 2023-12-26 ENCOUNTER — Encounter: Payer: Self-pay | Admitting: *Deleted

## 2023-12-26 NOTE — Progress Notes (Signed)
 Called and spoke with pt and she does not want care here. She states that she is very unhappy with her recent admission and was not informed of possible CT guided liver biopsy that she went down for ultrasound biopsy and they were unable to do it and then they told me I could go home,  no one told me about another biopsy or if they would be able to do it so they just sent me home  I don't know those Drs and I am not seeing them, I will wait and go to someone I know and trust Explained to pt need for CT guided biopsy and provided contact number to call with any questions and to ensure that she has care somewhere.  She stated that she had a plan but did not want to tell me the names.  Again requested that pt have my phone number so we can make sure she is getting the care she needs. At end of conversation pt stated that she would call and let our team know that she was getting care.

## 2023-12-26 NOTE — Progress Notes (Signed)
 Biopsy approval : CT guided liver mass bx with moderate sedation. Could not reliably be seen with ultrasound  - per Dr. Jenna

## 2023-12-27 ENCOUNTER — Telehealth: Payer: Self-pay

## 2023-12-27 NOTE — Telephone Encounter (Signed)
   Pre-operative Risk Assessment    Patient Name: Destiny Rice  DOB: [birth year] MRN: [id number]   Date of last office visit: 12/10/22 Date of next office visit: 02/03/24   Request for Surgical Clearance    Procedure:  CT Biopsy  Date of Surgery:  Clearance TBD                                Surgeon:  Not provided Surgeon's Group or Practice Name:  [facility]  Phone number:  [phone] Fax number:  [phone]   Type of Clearance Requested:   - Medical  - Pharmacy:  Hold Apixaban  (Eliquis ) x 2 days prior   Type of Anesthesia:  Not Indicated (Moderate Sedation)   Additional requests/questions:    [name]   12/27/2023, 9:04 AM

## 2023-12-27 NOTE — Telephone Encounter (Signed)
 Pharmacy please advise on holding Eliquis  prior to CT Biopsy scheduled for TBD. Last labs 12/25/2023.  Thank you.

## 2023-12-27 NOTE — Telephone Encounter (Signed)
 Dr. Darron  You saw this patient on 12/10/2023. Per protocol we request that you comment on his cardiac risk to proceed with CT Biopsy since it has been less than 2 months since evaluated in the office. Pharmacy has been contacted concerning holding Eliquis  for procedure. Please send your comment to P CV Pre-Op Pool.  Thank you, Lamarr Satterfield DNP, ANP, AACC.

## 2023-12-27 NOTE — Telephone Encounter (Signed)
Low risk from a cardiac standpoint. °

## 2023-12-31 NOTE — Telephone Encounter (Signed)
   Name: Destiny Rice  DOB: 05-01-1945  MRN: [id number]   Primary Cardiologist: Darryle ONEIDA Decent, MD  Chart reviewed as part of pre-operative protocol coverage. Destiny Rice was last seen on 12/10/2023 by Dr Darron.  Per Dr. Darron low risk from a cardiac standpoint.  Therefore, based on ACC/AHA guidelines, the patient would be an acceptable risk for the planned procedure without further cardiovascular testing.   Per Pharm D, patient has not had an Afib/aflutter ablation within the last 3 months, DCCV within the last 4 weeks, or Watchman in the last 45 days. Patient may hold Eliquis  for 2 days prior to procedure.    I will route this recommendation to the requesting party via Epic fax function and remove from pre-op pool. Please call with questions.  Barnie Hila, NP 12/31/2023, 4:59 PM

## 2023-12-31 NOTE — Telephone Encounter (Signed)
 Patient with diagnosis of afib on Eliquis  for anticoagulation.    Procedure: CT Biopsy  Date of procedure: TBD   CHA2DS2-VASc Score = 6   This indicates a 9.7% annual risk of stroke. The patient's score is based upon: CHF History: 0 HTN History: 1 Diabetes History: 1 Stroke History: 0 Vascular Disease History: 1 Age Score: 2 Gender Score: 1      Outside records state that patient was told she had a stroke but she doesn't remember this. Cannot find documentation of this.  CrCl 31 ml/min Platelet count 220  Patient has not had an Afib/aflutter ablation in the last 3 months, DCCV within the last 4 weeks or a watchman implanted in the last 45 days   Per office protocol, patient can hold Eliquis  for 2 days prior to procedure.    **This guidance is not considered finalized until pre-operative APP has relayed final recommendations.**

## 2024-01-01 DIAGNOSIS — D509 Iron deficiency anemia, unspecified: Secondary | ICD-10-CM | POA: Diagnosis not present

## 2024-01-01 DIAGNOSIS — N184 Chronic kidney disease, stage 4 (severe): Secondary | ICD-10-CM | POA: Diagnosis not present

## 2024-01-01 DIAGNOSIS — J206 Acute bronchitis due to rhinovirus: Secondary | ICD-10-CM | POA: Diagnosis not present

## 2024-01-01 DIAGNOSIS — N179 Acute kidney failure, unspecified: Secondary | ICD-10-CM | POA: Diagnosis not present

## 2024-01-01 DIAGNOSIS — I152 Hypertension secondary to endocrine disorders: Secondary | ICD-10-CM | POA: Diagnosis not present

## 2024-01-01 DIAGNOSIS — E1159 Type 2 diabetes mellitus with other circulatory complications: Secondary | ICD-10-CM | POA: Diagnosis not present

## 2024-01-01 NOTE — Progress Notes (Signed)
 Subjective   Patient ID:  Destiny Rice is a 78 y.o. (DOB [birth year]) female    Patient presents with  . TCM     History of Present Illness The patient is a 78 year old female presenting for hospital follow-up. She was hospitalized for fatigue and weakness due to chronic iron deficiency anemia, receiving two IV iron infusions and diagnosed with rhinovirus.  Fatigue and Weakness - Hospitalized for fatigue and weakness due to chronic iron deficiency anemia - Received two IV iron infusions - Diagnosed with rhinovirus  Cold Symptoms - Feeling unwell with a cold, similar to last year's RSV infection - Experiencing coughing without fever since discharge - Cough has improved, producing yellow sputum - Recalls effective medication prescribed by Dr. Reita last year  Elevated Blood Sugar - Blood sugar levels were elevated to 200 during hospitalization - Levels have since decreased  Hospital D/C Summary: Patient is a 78 y.o. female with known history of chronic iron deficiency anemia, CKD stage IIIb, PAF on Eliquis , severe AS-s/p TAVR, DM-2 who presented with fatigue/shortness of breath-upon further evaluation-she was found to have worsening anemia and new liver mass.   Fatigue/exertional dyspnea-likely secondary to symptomatic microcytic-iron deficiency anemia No obvious blood loss-brown stools-FOBT negative on 9/25 S/p IV iron x 2 this hospitalization EGD on 9/29 without any major findings Declined colonoscopy, d/c on oral iron  Newly diagnosed liver mass Concern for malignancy MRI liver not diagnostic; EGD negative for any malignancy; declined colonoscopy Unable to do liver biopsy 9/30 inpatient 2/2 elevated BP and patient prefers outpatient workup. Outpatient IR liver biopsy arranged.   +Rhinovirus at time of admission, COVID negative   Review of Systems  Constitutional:  Positive for fatigue. Negative for chills and fever.  Respiratory:  Positive for cough. Negative for  shortness of breath.   Cardiovascular:  Negative for chest pain and leg swelling.  Musculoskeletal:  Negative for gait problem.  Allergic/Immunologic: Negative for immunocompromised state.  Neurological:  Negative for weakness.     Objective  BP (!) 135/55 Comment: home BP  Pulse 58   Temp 98 F (36.7 C) (Temporal)   Resp 16   Ht 5' 4 (1.626 m)   Wt 239 lb (108.4 kg)   LMP  (LMP Unknown)   SpO2 95%   BMI 41.02 kg/m    Physical Exam Vitals reviewed.  Constitutional:      Appearance: Normal appearance. She is not ill-appearing.  HENT:     Head: Normocephalic and atraumatic.     Nose: Nose normal.  Eyes:     Conjunctiva/sclera: Conjunctivae normal.  Cardiovascular:     Rate and Rhythm: Normal rate.  Pulmonary:     Effort: Pulmonary effort is normal.  Skin:    General: Skin is warm and dry.  Neurological:     Mental Status: She is alert.     Gait: Gait normal.  Psychiatric:        Behavior: Behavior normal.     Physical Exam General Appearance: Normal appearance. Vital signs: Elevated blood pressure today. Respiratory: Clear to auscultation, no wheezing, rales, or rhonchi. Skin: Warm and dry, no rash Neurological: No obvious focal deficits. Psychiatric: Normal mood and behavior.   Results   Assessment and Plan     ICD-10-CM   1. Acute bronchitis due to Rhinovirus  J20.6     2. Iron deficiency anemia, unspecified iron deficiency anemia type  D50.9 CBC And Differential    Comprehensive Metabolic Panel    3. AKI (acute kidney  injury)  N17.9 CBC And Differential    Comprehensive Metabolic Panel    4. Stage 4 chronic kidney disease (*)  N18.4 CBC And Differential    Comprehensive Metabolic Panel    5. Hypertension associated with diabetes (*)  E11.59 CBC And Differential   I15.2 Comprehensive Metabolic Panel      Assessment & Plan 1.  Rhinovirus infection: Diagnosed. - Prescribed Tessalon  Perles 100 mg for persistent cough, sent to pharmacy. -  continue supportive care  - no concerning findings to indicate pneumonia   2. Chronic iron deficiency anemia: - Received 2 IV iron infusions inpatient.  - CBC today, further medication adjustments pending results  3. Hypertension: Slightly elevated in office but home BPs have been at baseline  - Advised to resume medication and monitor blood pressure at home.  4. T2DM: Elevated blood sugar levels to 200 during hospitalization, now back at her baseline  Follow-up - Blood work today to assess kidney function and blood counts.   Destiny SHAUNNA Montenegro, MD

## 2024-01-31 ENCOUNTER — Other Ambulatory Visit: Payer: Self-pay | Admitting: Cardiovascular Disease

## 2024-01-31 ENCOUNTER — Telehealth: Payer: Self-pay | Admitting: Cardiovascular Disease

## 2024-01-31 MED ORDER — APIXABAN 5 MG PO TABS: 5.0000 mg | ORAL_TABLET | Freq: Two times a day (BID) | ORAL | 5 refills | Status: DC

## 2024-01-31 NOTE — Telephone Encounter (Signed)
*  STAT* If patient is at the pharmacy, call can be transferred to refill team.   1. Which medications need to be refilled? (please list name of each medication and dose if known)   ELIQUIS  5 MG TABS tablet     2. Would you like to learn more about the convenience, safety, & potential cost savings by using the [facility]? No      3. Are you open to using the [facility] (Type [facility]. No    4. Which pharmacy/location (including street and city if local pharmacy) is medication to be sent to? [id number] - [place], [place] - [address] AT [facility] OF [address]    5. Do they need a 30 day or 90 day supply? 30 day   Pt is out of medication.

## 2024-01-31 NOTE — Telephone Encounter (Signed)
 Prescription refill request for Eliquis  received. Indication:  AFIB Last office visit: 07/04/23 Scr: 1.62  Age:  78 Weight: 106.6KG

## 2024-02-02 NOTE — Progress Notes (Unsigned)
  Cardiology Office Note:  .   Date:  02/02/2024  ID:  Destiny Rice, DOB 19-Mar-1946, MRN [id number] PCP: Reita Locus, MD  [facility] Providers Cardiologist:  Darryle ONEIDA Decent, MD { Click to update primary MD,subspecialty MD or APP then REFRESH:1}   History of Present Illness: .   No chief complaint on file.   Destiny Rice is a 78 y.o. female with history of HTN, CKD 3b, DM, HLD, CAD, PAD, MV repair, TAVR who presents for follow-up.     *** Problem List 1. CAD -prior PCI 2. Cardiac tumor 3. Mitral Valve Repair -26 mm Edwards annuloplasty ring -12/09/2012 4. Severe aortic stenosis s/p 26 mm Evolut Pro -04/24/2018 4. Paroxysmal Afib 5. Carotid Artery Disease s/p L CEA -R ICA 100% 6. HTN 7. DM -A1c 6.8 8. HLD -T chol 176, TG 58, HDL 90, LDL 75 9. CKD IIIb/IV -Cr 1.54 10. PAD/critical limb ischemia -angioplasty R peroneal/tibial artery 11. Liver mass  12. Anemia     ROS: All other ROS reviewed and negative. Pertinent positives noted in the HPI.     Studies Reviewed: SABRA       Physical Exam:   VS:  There were no vitals taken for this visit.   Wt Readings from Last 3 Encounters:  12/19/23 235 lb (106.6 kg)  12/10/23 235 lb (106.6 kg)  07/04/23 235 lb (106.6 kg)    GEN: Well nourished, well developed in no acute distress NECK: No JVD; No carotid bruits CARDIAC: ***RRR, no murmurs, rubs, gallops RESPIRATORY:  Clear to auscultation without rales, wheezing or rhonchi  ABDOMEN: Soft, non-tender, non-distended EXTREMITIES:  No edema; No deformity  ASSESSMENT AND PLAN: .   ***    {Are you ordering a CV Procedure (e.g. stress test, cath, DCCV, TEE, etc)?   Press F2        :[id number]}   Follow-up: No follow-ups on file.  Signed, Darryle ONEIDA. Decent, MD, [facility]  [facility]  [address] [place], [place] [address] [phone]  8:57 PM

## 2024-02-03 ENCOUNTER — Ambulatory Visit: Attending: Cardiovascular Disease | Admitting: Cardiovascular Disease

## 2024-02-03 ENCOUNTER — Encounter: Payer: Self-pay | Admitting: Cardiovascular Disease

## 2024-02-03 VITALS — BP 140/80 | HR 58 | Ht 65.0 in | Wt 240.6 lb

## 2024-02-03 DIAGNOSIS — I10 Essential (primary) hypertension: Secondary | ICD-10-CM | POA: Diagnosis not present

## 2024-02-03 DIAGNOSIS — I779 Disorder of arteries and arterioles, unspecified: Secondary | ICD-10-CM

## 2024-02-03 DIAGNOSIS — I2510 Atherosclerotic heart disease of native coronary artery without angina pectoris: Secondary | ICD-10-CM

## 2024-02-03 DIAGNOSIS — E785 Hyperlipidemia, unspecified: Secondary | ICD-10-CM

## 2024-02-03 DIAGNOSIS — I480 Paroxysmal atrial fibrillation: Secondary | ICD-10-CM

## 2024-02-03 DIAGNOSIS — Z9889 Other specified postprocedural states: Secondary | ICD-10-CM

## 2024-02-03 DIAGNOSIS — Z952 Presence of prosthetic heart valve: Secondary | ICD-10-CM

## 2024-02-03 DIAGNOSIS — I739 Peripheral vascular disease, unspecified: Secondary | ICD-10-CM | POA: Diagnosis not present

## 2024-02-03 NOTE — Patient Instructions (Signed)
 Medication Instructions:  Your physician recommends that you continue on your current medications as directed. Please refer to the Current Medication list given to you today.  *If you need a refill on your cardiac medications before your next appointment, please call your pharmacy*   Follow-Up: At [facility], you and your health needs are our priority.  As part of our continuing mission to provide you with exceptional heart care, our providers are all part of one team.  This team includes your primary Cardiologist (physician) and Advanced Practice Providers or APPs (Physician Assistants and Nurse Practitioners) who all work together to provide you with the care you need, when you need it.  Your next appointment:   6 month(s)  Provider:   Darryle ONEIDA Decent, MD    We recommend signing up for the patient portal called MyChart.  Sign up information is provided on this After Visit Summary.  MyChart is used to connect with patients for Virtual Visits (Telemedicine).  Patients are able to view lab/test results, encounter notes, upcoming appointments, etc.  Non-urgent messages can be sent to your provider as well.   To learn more about what you can do with MyChart, go to [email or web address].

## 2024-02-10 ENCOUNTER — Telehealth: Payer: Self-pay | Admitting: Cardiovascular Disease

## 2024-02-10 ENCOUNTER — Other Ambulatory Visit: Payer: Self-pay | Admitting: Cardiovascular Disease

## 2024-02-10 MED ORDER — ATORVASTATIN CALCIUM 40 MG PO TABS
40.0000 mg | ORAL_TABLET | Freq: Every day | ORAL | 1 refills | Status: AC
Start: 2024-02-10 — End: ?

## 2024-02-10 NOTE — Telephone Encounter (Signed)
 Refill sent.

## 2024-02-10 NOTE — Telephone Encounter (Signed)
*  STAT* If patient is at the pharmacy, call can be transferred to refill team.   1. Which medications need to be refilled? (please list name of each medication and dose if known) atorvastatin (LIPITOR) 40 MG tablet  2. Which pharmacy/location (including street and city if local pharmacy) is medication to be sent to?  [place], [place] - [address] AT [place]  3. Do they need a 30 day or 90 day supply? [place]

## 2024-02-14 DIAGNOSIS — E785 Hyperlipidemia, unspecified: Secondary | ICD-10-CM | POA: Diagnosis not present

## 2024-02-14 DIAGNOSIS — E1169 Type 2 diabetes mellitus with other specified complication: Secondary | ICD-10-CM | POA: Diagnosis not present

## 2024-02-14 DIAGNOSIS — I152 Hypertension secondary to endocrine disorders: Secondary | ICD-10-CM | POA: Diagnosis not present

## 2024-02-14 DIAGNOSIS — E1159 Type 2 diabetes mellitus with other circulatory complications: Secondary | ICD-10-CM | POA: Diagnosis not present

## 2024-02-14 DIAGNOSIS — N1832 Chronic kidney disease, stage 3b: Secondary | ICD-10-CM | POA: Diagnosis not present

## 2024-02-14 DIAGNOSIS — K219 Gastro-esophageal reflux disease without esophagitis: Secondary | ICD-10-CM | POA: Diagnosis not present

## 2024-02-14 DIAGNOSIS — E1122 Type 2 diabetes mellitus with diabetic chronic kidney disease: Secondary | ICD-10-CM | POA: Diagnosis not present

## 2024-02-14 DIAGNOSIS — D509 Iron deficiency anemia, unspecified: Secondary | ICD-10-CM | POA: Diagnosis not present

## 2024-02-14 DIAGNOSIS — E782 Mixed hyperlipidemia: Secondary | ICD-10-CM | POA: Diagnosis not present

## 2024-03-10 DIAGNOSIS — E1159 Type 2 diabetes mellitus with other circulatory complications: Secondary | ICD-10-CM | POA: Diagnosis not present

## 2024-03-10 DIAGNOSIS — I152 Hypertension secondary to endocrine disorders: Secondary | ICD-10-CM | POA: Diagnosis not present

## 2024-03-10 DIAGNOSIS — I10 Essential (primary) hypertension: Secondary | ICD-10-CM | POA: Diagnosis not present

## 2024-03-10 DIAGNOSIS — R42 Dizziness and giddiness: Secondary | ICD-10-CM | POA: Diagnosis not present

## 2024-04-28 ENCOUNTER — Other Ambulatory Visit: Payer: Self-pay | Admitting: Cardiovascular Disease

## 2024-04-28 DIAGNOSIS — I160 Hypertensive urgency: Secondary | ICD-10-CM

## 2024-04-28 MED ORDER — AMIODARONE HCL 200 MG PO TABS: 200.0000 mg | ORAL_TABLET | Freq: Every day | ORAL | 3 refills | Status: AC

## 2024-04-28 MED ORDER — APIXABAN 2.5 MG PO TABS: 2.5000 mg | ORAL_TABLET | Freq: Two times a day (BID) | ORAL | 5 refills | Status: AC

## 2024-04-28 NOTE — Telephone Encounter (Signed)
" °*  STAT* If patient is at the pharmacy, call can be transferred to refill team.   1. Which medications need to be refilled? (please list name of each medication and dose if known)  amiodarone  (PACERONE ) 200 MG tablet   apixaban  (ELIQUIS ) 5 MG TABS tablet (30 day)      2. Would you like to learn more about the convenience, safety, & potential cost savings by using the [facility]? no   3. Are you open to using the [facility] (Type [facility]. no   4. Which pharmacy/location (including street and city if local pharmacy) is medication to be sent to? [facility] #[id number] - [place], [place] - [address] AT [facility] [address]      5. Do they need a 30 day or 90 day supply? 90 day amiodarone , 30 day eliquis     Pt is out.  "

## 2024-08-03 ENCOUNTER — Ambulatory Visit: Admitting: Cardiovascular Disease
